# Patient Record
Sex: Female | Born: 1985
Health system: Southern US, Community
[De-identification: ages and names within clinical notes are randomized; demographics above are authoritative.]

## PROBLEM LIST (undated history)

## (undated) ENCOUNTER — Inpatient Hospital Stay (HOSPITAL_COMMUNITY): Payer: Self-pay

## (undated) DIAGNOSIS — Q513 Bicornate uterus: Secondary | ICD-10-CM

## (undated) DIAGNOSIS — B977 Papillomavirus as the cause of diseases classified elsewhere: Secondary | ICD-10-CM

## (undated) DIAGNOSIS — Q5128 Other doubling of uterus, other specified: Secondary | ICD-10-CM

## (undated) DIAGNOSIS — R87629 Unspecified abnormal cytological findings in specimens from vagina: Secondary | ICD-10-CM

## (undated) DIAGNOSIS — O34599 Maternal care for other abnormalities of gravid uterus, unspecified trimester: Secondary | ICD-10-CM

## (undated) DIAGNOSIS — R112 Nausea with vomiting, unspecified: Secondary | ICD-10-CM

## (undated) DIAGNOSIS — Z3480 Encounter for supervision of other normal pregnancy, unspecified trimester: Secondary | ICD-10-CM

## (undated) DIAGNOSIS — Z9889 Other specified postprocedural states: Secondary | ICD-10-CM

## (undated) HISTORY — PX: APPENDECTOMY: SHX54

## (undated) HISTORY — DX: Papillomavirus as the cause of diseases classified elsewhere: B97.7

## (undated) HISTORY — DX: Other doubling of uterus, other specified: Q51.28

## (undated) HISTORY — DX: Unspecified abnormal cytological findings in specimens from vagina: R87.629

## (undated) HISTORY — DX: Maternal care for other abnormalities of gravid uterus, unspecified trimester: O34.599

## (undated) HISTORY — PX: MULTIPLE TOOTH EXTRACTIONS: SHX2053

## (undated) HISTORY — PX: BREAST ENHANCEMENT SURGERY: SHX7

## (undated) HISTORY — PX: BREAST SURGERY: SHX581

---
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
## 2017-06-03 NOTE — L&D Delivery Note (Addendum)
Delivery Note At [phone] a viable female infant was delivered via SVD, presentation: LOA. Care assumed by Clelia CroftShaw, CNM after delivery of baby. APGAR: 9, 9; weight pending: pending.   Placenta status: spontaneously delivered, intact via . Cord: 3 vessel.  Complications nuchal x1, loose, reduced.  Anesthesia: epidural and 1% lidocaine Lacerations: R labia minora 'split', repaired with one stich Suture used for repair: 4.0 Vicryl Est. Blood Loss (mL): 200  Mom to postpartum.  Baby to Couplet care / Skin to Skin.   Donette LarryBhambri, Melanie, CNM 01/13/2018 8:52 AM   Delivery of placenta and repair by me, as documented above. Cam HaiSHAW, Hillery Bhalla CNM 01/13/2018 9:47 AM   Please schedule this patient for Postpartum visit in: 4 weeks with the following provider: Any provider For C/S patients schedule nurse incision check in weeks 2 weeks: no Low risk pregnancy complicated by: none Delivery mode:  SVD Anticipated Birth Control:  OCPs PP Procedures needed: none  Schedule Integrated BH visit: no

## 2017-07-09 LAB — OB RESULTS CONSOLE ABO/RH: RH TYPE: POSITIVE

## 2017-07-09 LAB — OB RESULTS CONSOLE GC/CHLAMYDIA
Chlamydia: NEGATIVE
Gonorrhea: NEGATIVE

## 2017-07-09 LAB — OB RESULTS CONSOLE RPR: RPR: NONREACTIVE

## 2017-07-09 LAB — OB RESULTS CONSOLE ANTIBODY SCREEN: ANTIBODY SCREEN: NEGATIVE

## 2017-07-09 LAB — OB RESULTS CONSOLE HEPATITIS B SURFACE ANTIGEN: Hepatitis B Surface Ag: NEGATIVE

## 2017-07-09 LAB — OB RESULTS CONSOLE RUBELLA ANTIBODY, IGM: Rubella: IMMUNE

## 2017-09-14 ENCOUNTER — Other Ambulatory Visit: Payer: Self-pay

## 2017-09-14 ENCOUNTER — Inpatient Hospital Stay (HOSPITAL_COMMUNITY)
Admission: AD | Admit: 2017-09-14 | Discharge: 2017-09-14 | Disposition: A | Discharge status: Home / Self Care | Payer: BLUE CROSS/BLUE SHIELD | Source: Ambulatory Visit | Attending: Obstetrics & Gynecology | Admitting: Obstetrics & Gynecology

## 2017-09-14 ENCOUNTER — Encounter (HOSPITAL_COMMUNITY): Payer: Self-pay | Admitting: *Deleted

## 2017-09-14 DIAGNOSIS — Z9889 Other specified postprocedural states: Secondary | ICD-10-CM | POA: Insufficient documentation

## 2017-09-14 DIAGNOSIS — O3402 Maternal care for unspecified congenital malformation of uterus, second trimester: Secondary | ICD-10-CM | POA: Diagnosis not present

## 2017-09-14 DIAGNOSIS — Z3A21 21 weeks gestation of pregnancy: Secondary | ICD-10-CM | POA: Diagnosis not present

## 2017-09-14 DIAGNOSIS — R109 Unspecified abdominal pain: Secondary | ICD-10-CM | POA: Diagnosis not present

## 2017-09-14 DIAGNOSIS — Q513 Bicornate uterus: Secondary | ICD-10-CM

## 2017-09-14 DIAGNOSIS — O9989 Other specified diseases and conditions complicating pregnancy, childbirth and the puerperium: Secondary | ICD-10-CM | POA: Diagnosis not present

## 2017-09-14 DIAGNOSIS — O9A212 Injury, poisoning and certain other consequences of external causes complicating pregnancy, second trimester: Secondary | ICD-10-CM | POA: Diagnosis not present

## 2017-09-14 HISTORY — DX: Bicornate uterus: Q51.3

## 2017-09-14 LAB — URINALYSIS, ROUTINE W REFLEX MICROSCOPIC
Bilirubin Urine: NEGATIVE
Glucose, UA: NEGATIVE mg/dL
Hgb urine dipstick: NEGATIVE
Ketones, ur: 80 mg/dL — AB
LEUKOCYTES UA: NEGATIVE
Nitrite: NEGATIVE
PH: 6 (ref 5.0–8.0)
Protein, ur: NEGATIVE mg/dL
SPECIFIC GRAVITY, URINE: 1.011 (ref 1.005–1.030)

## 2017-09-14 MED ORDER — ACETAMINOPHEN 500 MG PO TABS
1000.0000 mg | ORAL_TABLET | Freq: Once | ORAL | Status: AC
  Administered 2017-09-14: 1000 mg via ORAL
  Filled 2017-09-14: qty 2
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
## 2017-09-14 NOTE — Discharge Instructions (Signed)
[facility]  ° ° °[facility]       Phone: [phone] ° °[facility] Phone: [phone] ° °[facility] [place]  Phone: [phone] ° °[facility] [place]  Phone: [phone] ° °[facility] [place]  Phone: [phone] ° °[facility]       Phone: [phone] ° °[facility]    Phone: [phone]  ° °[facility]    Phone: [phone] ° °[facility]    Phone: [phone] ° °[facility]    Phone: [phone] ° °[facility]    Phone: [phone] ° °[facility]       Phone: [phone]  ° °[facility]  Phone: [phone] ° °[facility]    Phone: [phone] ° °[facility]   Phone: [phone] ° °[facility]      Phone: [phone] ° °[facility]    Phone: [phone] ° °Safe Medications in Pregnancy  ° °Acne: °Benzoyl Peroxide °Salicylic Acid ° °Backache/Headache: °Tylenol: 2 regular strength every 4 hours OR °             2 Extra strength every 6 hours ° °Colds/Coughs/Allergies: °Benadryl (alcohol free) 25 mg every 6 hours as needed °Breath right strips °Claritin °Cepacol throat lozenges °Chloraseptic throat spray °Cold-Eeze- up to three times per day °Cough drops, alcohol free °Flonase (by prescription only) °Guaifenesin °Mucinex °Robitussin DM (plain only, alcohol free) °Saline nasal spray/drops °Sudafed (pseudoephedrine) & Actifed ** use only after [redacted] weeks gestation and if you do not have high blood pressure °Tylenol °Vicks Vaporub °Zinc lozenges °Zyrtec  ° °Constipation: °Colace °Ducolax suppositories °Fleet enema °Glycerin suppositories °Metamucil °Milk of  magnesia °Miralax °Senokot °Smooth move tea ° °Diarrhea: °Kaopectate °Imodium A-D ° °*NO pepto Bismol ° °Hemorrhoids: °Anusol °Anusol HC °Preparation H °Tucks ° °Indigestion: °Tums °Maalox °Mylanta °Zantac  °Pepcid ° °Insomnia: °Benadryl (alcohol free) 25mg every 6 hours as needed °Tylenol PM °Unisom, no Gelcaps ° °Leg Cramps: °Tums °MagGel ° °Nausea/Vomiting:  °Bonine °Dramamine °Emetrol °Ginger extract °Sea bands °Meclizine  °Nausea medication to take during pregnancy:  °Unisom (doxylamine succinate 25 mg tablets) Take one tablet daily at bedtime. If symptoms are not adequately controlled, the dose can be increased to a maximum recommended dose of two tablets daily (1/2 tablet in the morning, 1/2 tablet mid-afternoon and one at bedtime). °Vitamin B6 100mg tablets. Take one tablet twice a day (up to 200 mg per day). ° °Skin Rashes: °Aveeno products °Benadryl cream or 25mg every 6 hours as needed °Calamine Lotion °1% cortisone cream ° °Yeast infection: °Gyne-lotrimin 7 °Monistat 7 ° ° °**If taking multiple medications, please check labels to avoid duplicating the same active ingredients °**take medication as directed on the label °** Do not exceed 4000 mg of tylenol in 24 hours °**Do not take medications that contain aspirin or ibuprofen ° ° ° ° °

## 2017-10-08 ENCOUNTER — Encounter: Payer: Self-pay | Admitting: *Deleted

## 2017-10-08 DIAGNOSIS — Z3480 Encounter for supervision of other normal pregnancy, unspecified trimester: Secondary | ICD-10-CM | POA: Insufficient documentation

## 2017-10-08 HISTORY — DX: Encounter for supervision of other normal pregnancy, unspecified trimester: Z34.80

## 2017-10-16 ENCOUNTER — Ambulatory Visit (INDEPENDENT_AMBULATORY_CARE_PROVIDER_SITE_OTHER): Payer: BLUE CROSS/BLUE SHIELD | Admitting: Obstetrics & Gynecology

## 2017-10-16 ENCOUNTER — Encounter: Payer: Self-pay | Admitting: Obstetrics & Gynecology

## 2017-10-16 DIAGNOSIS — Z3480 Encounter for supervision of other normal pregnancy, unspecified trimester: Secondary | ICD-10-CM

## 2017-10-16 DIAGNOSIS — Z3482 Encounter for supervision of other normal pregnancy, second trimester: Secondary | ICD-10-CM

## 2017-10-16 NOTE — Progress Notes (Signed)
   PRENATAL VISIT NOTE  Subjective:  Cassie Petersen is a 32 y.o. G2P0010 at [redacted]w[redacted]d being seen today for ongoing prenatal care.  She is currently monitored for the following issues for this low-risk pregnancy and has Supervision of other normal pregnancy, antepartum on their problem list.  Patient reports no complaints.  Contractions: Not present. Vag. Bleeding: None.  Movement: Present. Denies leaking of fluid.   The following portions of the patient's history were reviewed and updated as appropriate: allergies, current medications, past family history, past medical history, past social history, past surgical history and problem list. Problem list updated.  Objective:   Vitals:   10/08/17 1633 10/16/17 1414  BP:  107/68  Pulse:  (!) 112  Weight:  133 lb (60.3 kg)  Height:  (1.727 m)     Fetal Status: Fetal Heart Rate (bpm): 145   Movement: Present     General:  Alert, oriented and cooperative. Patient is in no acute distress.  Skin: Skin is warm and dry. No rash noted.   Cardiovascular: Normal heart rate noted  Respiratory: Normal respiratory effort, no problems with respiration noted  Abdomen: Soft, gravid, appropriate for gestational age.  Pain/Pressure: Present     Pelvic: Cervical exam deferred        Extremities: Normal range of motion.  Edema: None  Mental Status: Normal mood and affect. Normal behavior. Normal judgment and thought content.   Assessment and Plan:  Pregnancy: G2P0010 at [redacted]w[redacted]d  1. Supervision of other normal pregnancy, antepartum - 2 hour GTT at next visit We had a long discussion about her concerns about her "heart shaped uterus" and whether it can generate enough strength for a contraction. She was also concerned that the baby would be large and she would then talk about scheduling a primary c/s.   Preterm labor symptoms and general obstetric precautions including but not limited to vaginal bleeding, contractions, leaking of fluid and fetal movement  were reviewed in detail with the patient. Please refer to After Visit Summary for other counseling recommendations.  Return in about 2 weeks (around 10/30/2017) for 2 hour GTT.  Future Appointments  Date Time Provider Department Center  10/29/2017  9:30 AM Lesly Dukes, MD [facility]    Allie Bossier, MD

## 2017-10-29 ENCOUNTER — Ambulatory Visit (INDEPENDENT_AMBULATORY_CARE_PROVIDER_SITE_OTHER): Payer: BLUE CROSS/BLUE SHIELD | Admitting: Obstetrics & Gynecology

## 2017-10-29 VITALS — BP 108/63 | HR 102 | Wt 134.0 lb

## 2017-10-29 DIAGNOSIS — Z3400 Encounter for supervision of normal first pregnancy, unspecified trimester: Secondary | ICD-10-CM | POA: Diagnosis not present

## 2017-10-29 DIAGNOSIS — Z23 Encounter for immunization: Secondary | ICD-10-CM

## 2017-10-29 DIAGNOSIS — Z3483 Encounter for supervision of other normal pregnancy, third trimester: Secondary | ICD-10-CM

## 2017-10-29 DIAGNOSIS — Z3480 Encounter for supervision of other normal pregnancy, unspecified trimester: Secondary | ICD-10-CM

## 2017-10-29 NOTE — Progress Notes (Signed)
   PRENATAL VISIT NOTE  Subjective:  Cassie Petersen is a 32 y.o. G2P0010 at [redacted]w[redacted]d being seen today for ongoing prenatal care.  She is currently monitored for the following issues for this high-risk pregnancy and has Supervision of other normal pregnancy, antepartum on their problem list.  Patient reports no complaints.  Contractions: Not present. Vag. Bleeding: None.  Movement: Present. Denies leaking of fluid.   The following portions of the patient's history were reviewed and updated as appropriate: allergies, current medications, past family history, past medical history, past social history, past surgical history and problem list. Problem list updated.  Objective:   Vitals:   10/29/17 0915  BP: 108/63  Pulse: (!) 102  Weight: 134 lb (60.8 kg)    Fetal Status: Fetal Heart Rate (bpm): 140 Fundal Height: 27 cm Movement: Present     General:  Alert, oriented and cooperative. Patient is in no acute distress.  Skin: Skin is warm and dry. No rash noted.   Cardiovascular: Normal heart rate noted  Respiratory: Normal respiratory effort, no problems with respiration noted  Abdomen: Soft, gravid, appropriate for gestational age.  Pain/Pressure: Present     Pelvic: Cervical exam deferred        Extremities: Normal range of motion.  Edema: None  Mental Status: Normal mood and affect. Normal behavior. Normal judgment and thought content.   Assessment and Plan:  Pregnancy: G2P0010 at [redacted]w[redacted]d  1. Supervision of normal first pregnancy, antepartum - 2Hr GTT w/ 1 Hr Carpenter 75 g - HIV antibody (with reflex) - CBC - RPR - Tdap vaccine greater than or equal to 7yo IM  2. Bicornuate uterus - US MFM OB COMP + 14 WK; Future - Enroll Patient in Babyscripts  Preterm labor symptoms and general obstetric precautions including but not limited to vaginal bleeding, contractions, leaking of fluid and fetal movement were reviewed in detail with the patient. Please refer to After Visit Summary for  other counseling recommendations.  Return in about 2 weeks (around 11/12/2017).  No future appointments.  [name], MD

## 2017-10-30 ENCOUNTER — Telehealth: Payer: Self-pay | Admitting: *Deleted

## 2017-10-30 LAB — CBC
HCT: 35.9 % (ref 35.0–45.0)
HEMOGLOBIN: 12.3 g/dL (ref 11.7–15.5)
MCH: 31.4 pg (ref 27.0–33.0)
MCHC: 34.3 g/dL (ref 32.0–36.0)
MCV: 91.6 fL (ref 80.0–100.0)
MPV: 10.7 fL (ref 7.5–12.5)
Platelets: 171 10*3/uL (ref 140–400)
RBC: 3.92 10*6/uL (ref 3.80–5.10)
RDW: 12.8 % (ref 11.0–15.0)
WBC: 10.6 10*3/uL (ref 3.8–10.8)

## 2017-10-30 LAB — 2HR GTT W 1 HR, CARPENTER, 75 G
Glucose, 1 Hr, Gest: 134 mg/dL (ref 65–179)
Glucose, 2 Hr, Gest: 113 mg/dL (ref 65–152)
Glucose, Fasting, Gest: 75 mg/dL (ref 65–91)

## 2017-10-30 LAB — HIV ANTIBODY (ROUTINE TESTING W REFLEX): HIV: NONREACTIVE

## 2017-10-30 LAB — RPR: RPR Ser Ql: NONREACTIVE

## 2017-10-30 NOTE — Telephone Encounter (Signed)
Pt aware of normal 2 hr GTT.

## 2017-11-11 ENCOUNTER — Encounter (HOSPITAL_COMMUNITY): Payer: Self-pay

## 2017-11-13 ENCOUNTER — Ambulatory Visit (INDEPENDENT_AMBULATORY_CARE_PROVIDER_SITE_OTHER): Payer: BLUE CROSS/BLUE SHIELD | Admitting: Obstetrics & Gynecology

## 2017-11-13 VITALS — BP 102/73 | HR 93 | Wt 134.0 lb

## 2017-11-13 DIAGNOSIS — Z3483 Encounter for supervision of other normal pregnancy, third trimester: Secondary | ICD-10-CM

## 2017-11-13 DIAGNOSIS — Z3480 Encounter for supervision of other normal pregnancy, unspecified trimester: Secondary | ICD-10-CM

## 2017-11-13 NOTE — Progress Notes (Signed)
   PRENATAL VISIT NOTE  Subjective:  Cassie Petersen is a 32 y.o. G2P0010 at 2239w3d being seen today for ongoing prenatal care.  She is currently monitored for the following issues for this high-risk pregnancy and has Supervision of other normal pregnancy, antepartum on their problem list.  Patient reports no complaints.  Contractions: Irritability. Vag. Bleeding: None.  Movement: Absent. Denies leaking of fluid.   The following portions of the patient's history were reviewed and updated as appropriate: allergies, current medications, past family history, past medical history, past social history, past surgical history and problem list. Problem list updated.  Objective:   Vitals:   11/13/17 1446  BP: 102/73  Pulse: 93  Weight: 134 lb (60.8 kg)    Fetal Status: Fetal Heart Rate (bpm): 137   Movement: Absent     General:  Alert, oriented and cooperative. Patient is in no acute distress.  Skin: Skin is warm and dry. No rash noted.   Cardiovascular: Normal heart rate noted  Respiratory: Normal respiratory effort, no problems with respiration noted  Abdomen: Soft, gravid, appropriate for gestational age.  Pain/Pressure: Present     Pelvic: Cervical exam deferred        Extremities: Normal range of motion.  Edema: None  Mental Status: Normal mood and affect. Normal behavior. Normal judgment and thought content.   Assessment and Plan:  Pregnancy: G2P0010 at 7439w3d  1. Supervision of other normal pregnancy, antepartum - MFM u/s 11-19-17  Preterm labor symptoms and general obstetric precautions including but not limited to vaginal bleeding, contractions, leaking of fluid and fetal movement were reviewed in detail with the patient. Please refer to After Visit Summary for other counseling recommendations.  No follow-ups on file.  Future Appointments  Date Time Provider Department Center  11/19/2017  1:15 PM [facility] US 4 [facility]  12/02/2017  1:15 PM Allie Bossierove, Emrys Mceachron C, MD [facility]  [facility]    Allie BossierMyra C Dewain Platz, MD

## 2017-11-17 ENCOUNTER — Ambulatory Visit (HOSPITAL_COMMUNITY): Payer: BLUE CROSS/BLUE SHIELD

## 2017-11-19 ENCOUNTER — Ambulatory Visit (HOSPITAL_COMMUNITY)
Admission: RE | Admit: 2017-11-19 | Discharge: 2017-11-19 | Disposition: A | Discharge status: Home / Self Care | Payer: BLUE CROSS/BLUE SHIELD | Source: Ambulatory Visit | Attending: Obstetrics & Gynecology | Admitting: Obstetrics & Gynecology

## 2017-11-19 DIAGNOSIS — Z3A31 31 weeks gestation of pregnancy: Secondary | ICD-10-CM | POA: Diagnosis not present

## 2017-11-19 DIAGNOSIS — Z3480 Encounter for supervision of other normal pregnancy, unspecified trimester: Secondary | ICD-10-CM

## 2017-11-19 DIAGNOSIS — Z3483 Encounter for supervision of other normal pregnancy, third trimester: Secondary | ICD-10-CM | POA: Diagnosis not present

## 2017-11-19 DIAGNOSIS — Z363 Encounter for antenatal screening for malformations: Secondary | ICD-10-CM | POA: Diagnosis not present

## 2017-11-19 IMAGING — US US MFM OB COMP +14 WKS
1 series · 14 of 28 positions shown · non-contrast
Comparison: none

[Series 1: us mfm ob comp +14 wks · 79 acquisitions, 14 frames shown]
[im 3/79]
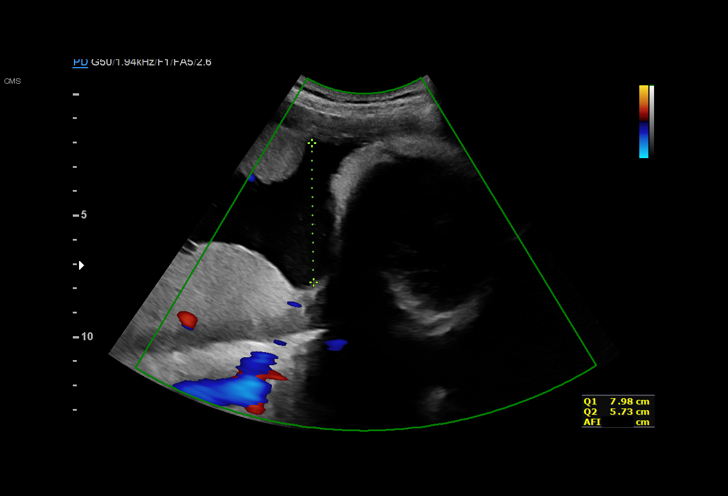
[im 9/79]
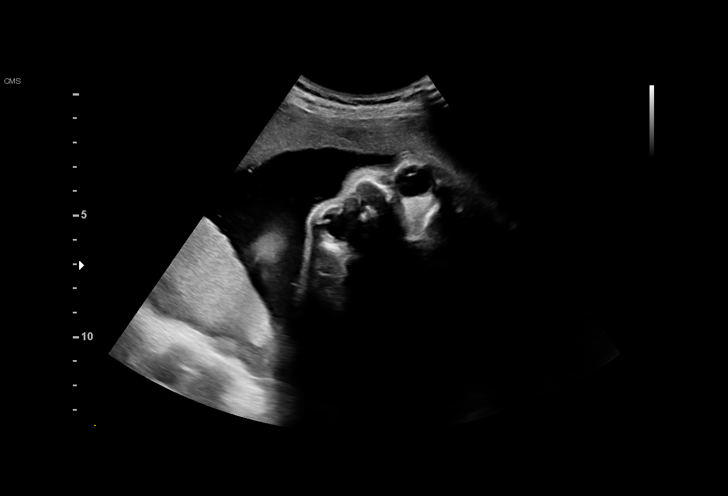
[im 15/79]
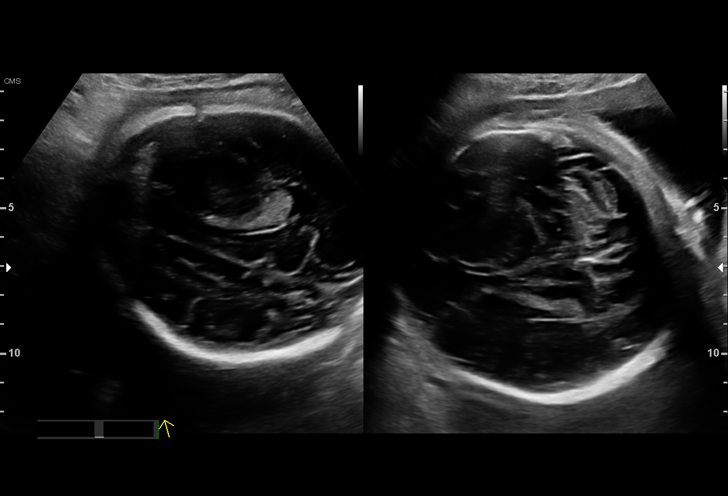
[im 21/79]
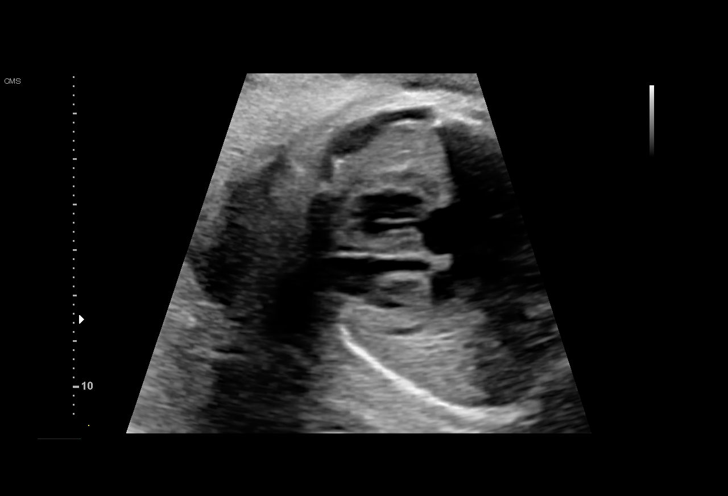
[im 27/79]
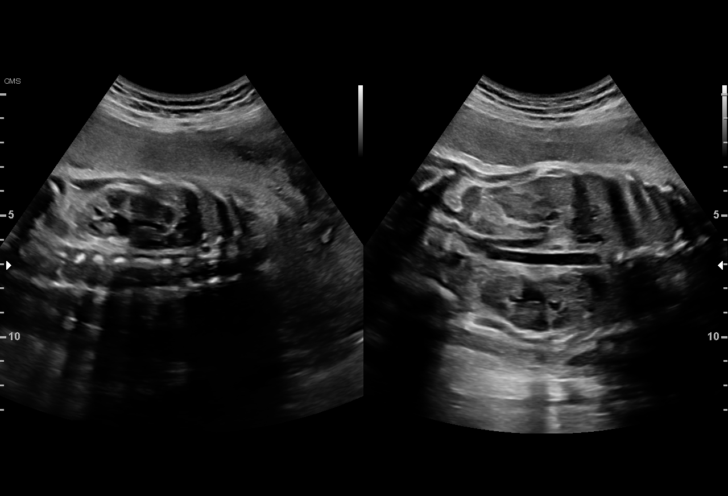
[im 32/79]
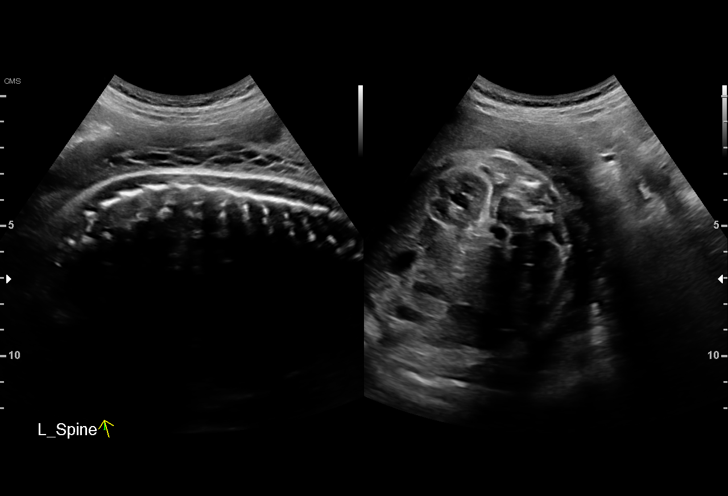
[im 38/79]
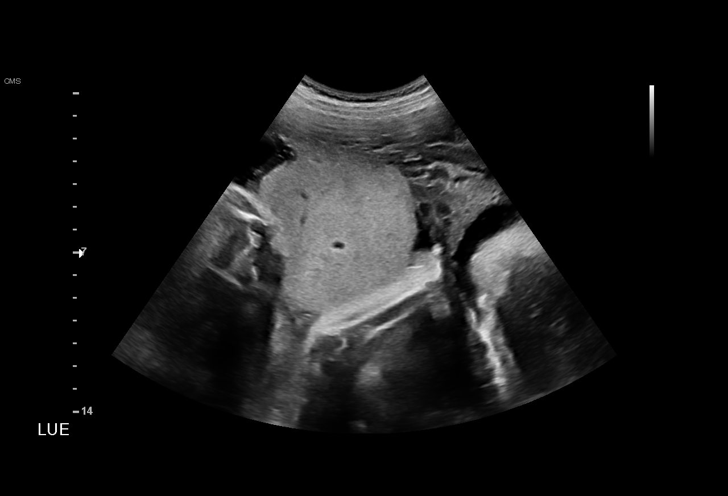
[im 44/79]
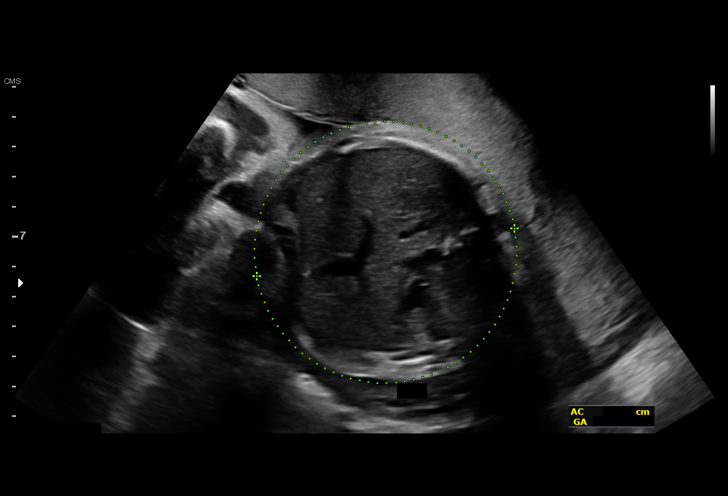
[im 50/79]
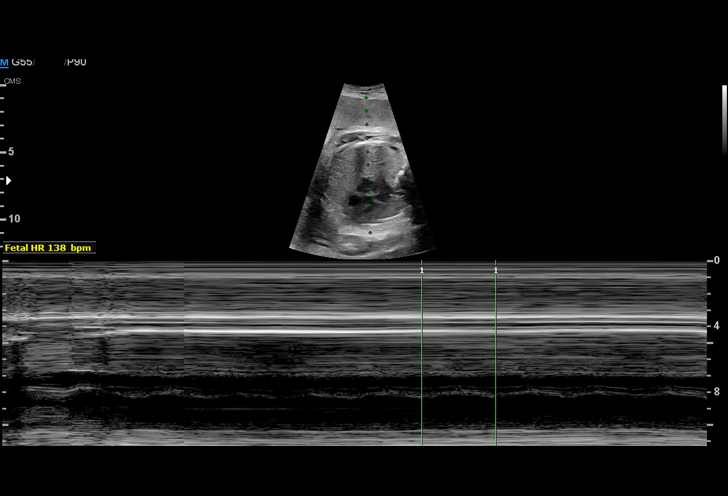
[im 55/79]
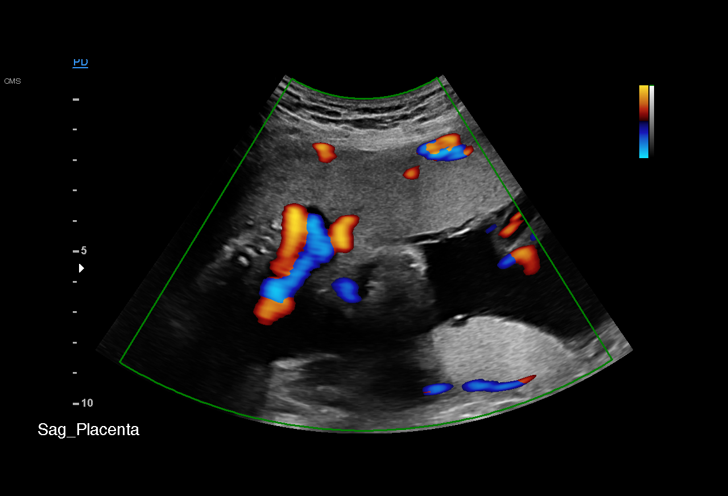
[im 61/79]
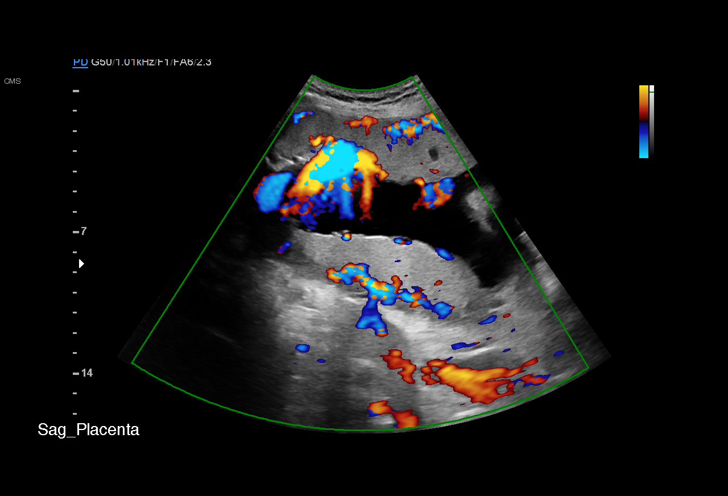
[im 67/79]
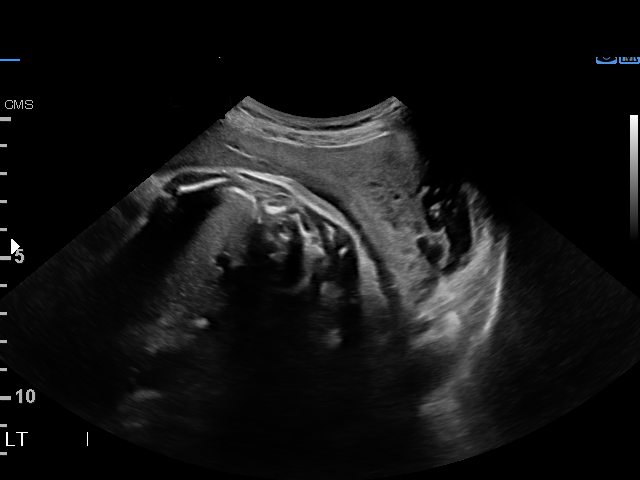
[im 73/79]
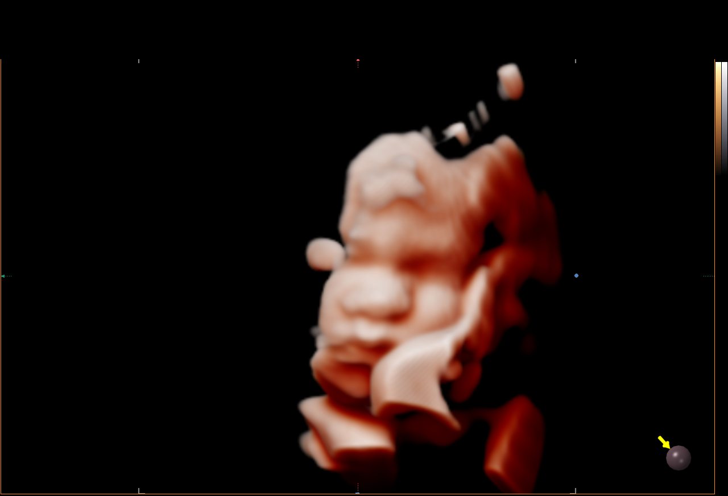
[im 79/79]
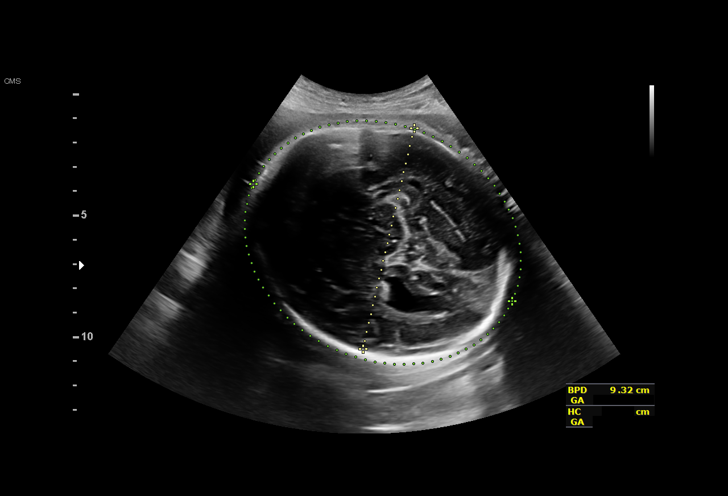

[14 of 28 positions shown; findings below may reference images not displayed]

OB/Gyn Clinic

1  BEENISH            [PHONE_NUMBER]      [PHONE_NUMBER]     [PHONE_NUMBER]
Indications

31 weeks gestation of pregnancy
Encounter for antenatal screening for          [WI]
malformations
Uterine abnormality during pregnancy           [WI]
(bicornuate?)
Fetal Evaluation

Num Of Fetuses:     1
Fetal Heart         138
Rate(bpm):
Cardiac Activity:   Observed
Presentation:       Cephalic
Placenta:           Anterior w/ post succenturiate, above os
P. Cord Insertion:  Visualized

Amniotic Fluid
AFI FV:      Subjectively within normal limits

AFI Sum(cm)     %Tile       Largest Pocket(cm)
19.52           74

RUQ(cm)       RLQ(cm)       LUQ(cm)        LLQ(cm)
7.98
Biometry

BPD:        93  mm     G. Age:  37w 6d       > 99  %    CI:         78.5   %    70 - 86
FL/HC:      17.6   %    19.3 -
HC:       332   mm     G. Age:  37w 6d       > 97  %    HC/AC:      1.21        0.96 -
AC:      275.4  mm     G. Age:  31w 4d         57  %    FL/BPD:     62.8   %    71 - 87
FL:       58.4  mm     G. Age:  30w 4d         18  %    FL/AC:      21.2   %    20 - 24
CER:      44.1  mm     G. Age:  38w 1d       > 95  %
CM:        7.1  mm

Est. FW:    [WI]  gm      4 lb 5 oz     76  %
Gestational Age

LMP:           31w 2d        Date:  [DATE]                 EDD:   [DATE]
U/S Today:     34w 3d                                        EDD:   [DATE]
Best:          31w 2d     Det. By:  LMP  ([DATE])          EDD:   [DATE]
Anatomy

Cranium:               Appears normal         Aortic Arch:            Appears normal
Cavum:                 Appears normal         Ductal Arch:            Appears normal
Ventricles:            Appears normal         Diaphragm:              Not well visualized
Choroid Plexus:        Appears normal         Stomach:                Appears normal, left
sided
Cerebellum:            Appears normal         Abdomen:                Appears normal
Posterior Fossa:       Appears normal         Abdominal Wall:         Not well visualized
Nuchal Fold:           Not applicable (>20    Cord Vessels:           Appears normal (3
wks GA)                                        vessel cord)
Face:                  Appears normal         Kidneys:                Appear normal
(orbits and profile)
Lips:                  Appears normal         Bladder:                Appears normal
Thoracic:              Appears normal         Spine:                  Appears normal
Heart:                 Appears normal         Upper Extremities:      Visualized
(4CH, axis, and
situs)
RVOT:                  Appears normal         Lower Extremities:      Visualized
LVOT:                  Appears normal

Other:  Male gender. Nasal bone visualized.
Cervix Uterus Adnexa

Cervix
Not visualized (advanced GA >[WI])

Uterus
Bicornuate.

Left Ovary
Within normal limits.

Right Ovary
Within normal limits.

Cul De Sac:   No free fluid seen.

Adnexa:       No abnormality visualized.
Impression

Single living intrauterine pregnancy at 31w 2d.
Placenta Anterior with posterior succenturiate lobe, above os.

Known bicornuate uterus, pregnancy in left horn.
Appropriate composite fetal growth.
The HC measures > 97%ile.
Normal amniotic fluid volume.
The fetal anatomic survey is not complete.
No gross fetal anomalies identified.
The adnexa appear normal bilaterally without masses.
Recommendations

Continue serial ultrasounds for fetal growth given significantly
enlarged HC and bicornuate uterus.

## 2017-12-02 ENCOUNTER — Ambulatory Visit (INDEPENDENT_AMBULATORY_CARE_PROVIDER_SITE_OTHER): Payer: BLUE CROSS/BLUE SHIELD | Admitting: Obstetrics & Gynecology

## 2017-12-02 VITALS — BP 119/81 | HR 102 | Wt 140.0 lb

## 2017-12-02 DIAGNOSIS — Z3480 Encounter for supervision of other normal pregnancy, unspecified trimester: Secondary | ICD-10-CM

## 2017-12-02 NOTE — Progress Notes (Signed)
   PRENATAL VISIT NOTE  Subjective:  Cassie JewelsLindsey Petersen is a 32 y.o. G2P0010 at 9318w1d being seen today for ongoing prenatal care.  She is currently monitored for the following issues for this low-risk pregnancy and has Supervision of other normal pregnancy, antepartum on their problem list.  Patient reports no complaints.  Contractions: Not present. Vag. Bleeding: None.  Movement: Present. Denies leaking of fluid.   The following portions of the patient's history were reviewed and updated as appropriate: allergies, current medications, past family history, past medical history, past social history, past surgical history and problem list. Problem list updated.  Objective:   Vitals:   12/02/17 1319  BP: 119/81  Pulse: (!) 102  Weight: 140 lb (63.5 kg)    Fetal Status: Fetal Heart Rate (bpm): 133 Fundal Height: 33 cm Movement: Present     General:  Alert, oriented and cooperative. Patient is in no acute distress.  Skin: Skin is warm and dry. No rash noted.   Cardiovascular: Normal heart rate noted  Respiratory: Normal respiratory effort, no problems with respiration noted  Abdomen: Soft, gravid, appropriate for gestational age.  Pain/Pressure: Absent     Pelvic: Cervical exam deferred        Extremities: Normal range of motion.  Edema: None  Mental Status: Normal mood and affect. Normal behavior. Normal judgment and thought content.   Assessment and Plan:  Pregnancy: G2P0010 at 5318w1d  1. Supervision of other normal pregnancy, antepartum  - US MFM OB FOLLOW UP; Future  Preterm labor symptoms and general obstetric precautions including but not limited to vaginal bleeding, contractions, leaking of fluid and fetal movement were reviewed in detail with the patient. Please refer to After Visit Summary for other counseling recommendations.  Return in about 2 weeks (around 12/16/2017).  No future appointments.  Allie BossierMyra C Yousuf Ager, MD

## 2017-12-09 ENCOUNTER — Ambulatory Visit (HOSPITAL_COMMUNITY): Payer: BLUE CROSS/BLUE SHIELD

## 2017-12-09 ENCOUNTER — Other Ambulatory Visit: Payer: Self-pay | Admitting: Obstetrics & Gynecology

## 2017-12-09 ENCOUNTER — Other Ambulatory Visit (HOSPITAL_COMMUNITY): Payer: Self-pay | Admitting: *Deleted

## 2017-12-09 ENCOUNTER — Ambulatory Visit (HOSPITAL_COMMUNITY)
Admission: RE | Admit: 2017-12-09 | Discharge: 2017-12-09 | Disposition: A | Discharge status: Home / Self Care | Payer: BLUE CROSS/BLUE SHIELD | Source: Ambulatory Visit | Attending: Obstetrics & Gynecology | Admitting: Obstetrics & Gynecology

## 2017-12-09 DIAGNOSIS — O34593 Maternal care for other abnormalities of gravid uterus, third trimester: Secondary | ICD-10-CM

## 2017-12-09 DIAGNOSIS — Z362 Encounter for other antenatal screening follow-up: Secondary | ICD-10-CM

## 2017-12-09 DIAGNOSIS — Z0489 Encounter for examination and observation for other specified reasons: Secondary | ICD-10-CM

## 2017-12-09 DIAGNOSIS — Q513 Bicornate uterus: Secondary | ICD-10-CM

## 2017-12-09 DIAGNOSIS — Z3A34 34 weeks gestation of pregnancy: Secondary | ICD-10-CM

## 2017-12-09 DIAGNOSIS — Z3480 Encounter for supervision of other normal pregnancy, unspecified trimester: Secondary | ICD-10-CM | POA: Diagnosis not present

## 2017-12-09 DIAGNOSIS — IMO0002 Reserved for concepts with insufficient information to code with codable children: Secondary | ICD-10-CM

## 2017-12-10 ENCOUNTER — Telehealth: Payer: Self-pay | Admitting: Obstetrics & Gynecology

## 2017-12-10 NOTE — Telephone Encounter (Signed)
Spoke with pt about her traveling via plane this weekend.  She will be 33 weeks and is low risk.  There is no reason that she cannot fly but instructed to call the airline and see what their cutoff is.

## 2017-12-10 NOTE — Telephone Encounter (Signed)
Pt called stating she needs "medial clearance" to travel on a plane this Friday 12/12/17. Pt states her husband wanted her to call to make sure it is OK to travel. Please advise.

## 2017-12-17 ENCOUNTER — Ambulatory Visit (INDEPENDENT_AMBULATORY_CARE_PROVIDER_SITE_OTHER): Payer: BLUE CROSS/BLUE SHIELD | Admitting: Obstetrics & Gynecology

## 2017-12-17 VITALS — BP 98/63 | HR 80 | Wt 139.0 lb

## 2017-12-17 DIAGNOSIS — Z3483 Encounter for supervision of other normal pregnancy, third trimester: Secondary | ICD-10-CM

## 2017-12-17 DIAGNOSIS — Z3480 Encounter for supervision of other normal pregnancy, unspecified trimester: Secondary | ICD-10-CM

## 2017-12-17 NOTE — Progress Notes (Signed)
   PRENATAL VISIT NOTE  Subjective:  Cassie Petersen is a [age] y.o. G2P0010 at 5367w2d being seen today for ongoing prenatal care.  She is currently monitored for the following issues for this low-risk pregnancy and has Supervision of other normal pregnancy, antepartum on their problem list.  Patient reports no complaints.  Contractions: Not present. Vag. Bleeding: None.  Movement: Present. Denies leaking of fluid.   The following portions of the patient's history were reviewed and updated as appropriate: allergies, current medications, past family history, past medical history, past social history, past surgical history and problem list. Problem list updated.  Objective:   Vitals:   12/17/17 1127  BP: 98/63  Pulse: 80  Weight: 139 lb (63 kg)    Fetal Status: Fetal Heart Rate (bpm): 131   Movement: Present     General:  Alert, oriented and cooperative. Patient is in no acute distress.  Skin: Skin is warm and dry. No rash noted.   Cardiovascular: Normal heart rate noted  Respiratory: Normal respiratory effort, no problems with respiration noted  Abdomen: Soft, gravid, appropriate for gestational age.  Pain/Pressure: Absent     Pelvic: Cervical exam deferred        Extremities: Normal range of motion.  Edema: None  Mental Status: Normal mood and affect. Normal behavior. Normal judgment and thought content.   Assessment and Plan:  Pregnancy: G2P0010 at 5167w2d  1. Supervision of other normal pregnancy, antepartum   Preterm labor symptoms and general obstetric precautions including but not limited to vaginal bleeding, contractions, leaking of fluid and fetal movement were reviewed in detail with the patient. Please refer to After Visit Summary for other counseling recommendations.  No follow-ups on file.  Future Appointments  Date Time Provider Department Center  01/06/2018  4:00 PM [facility] US 3 [facility]    Allie BossierMyra C Tasmin Exantus, MD

## 2017-12-25 ENCOUNTER — Ambulatory Visit (INDEPENDENT_AMBULATORY_CARE_PROVIDER_SITE_OTHER): Payer: BLUE CROSS/BLUE SHIELD | Admitting: Obstetrics & Gynecology

## 2017-12-25 VITALS — BP 130/81 | HR 94 | Wt 138.0 lb

## 2017-12-25 DIAGNOSIS — Z3480 Encounter for supervision of other normal pregnancy, unspecified trimester: Secondary | ICD-10-CM

## 2017-12-25 DIAGNOSIS — Z3400 Encounter for supervision of normal first pregnancy, unspecified trimester: Secondary | ICD-10-CM

## 2017-12-25 NOTE — Progress Notes (Signed)
   PRENATAL VISIT NOTE  Subjective:  Cassie Petersen is a 8Kalman Jewels89 y.o. G2P0010 at 148w3d being seen today for ongoing prenatal care.  She is currently monitored for the following issues for this low-risk pregnancy and has Supervision of other normal pregnancy, antepartum on their problem list.  Patient reports no complaints. She is very sad as she lost 2 dogs this week. She declines to have cervical cultures today.  Contractions: Not present. Vag. Bleeding: None.  Movement: Present. Denies leaking of fluid.   The following portions of the patient's history were reviewed and updated as appropriate: allergies, current medications, past family history, past medical history, past social history, past surgical history and problem list. Problem list updated.  Objective:   Vitals:   12/25/17 1327  BP: 130/81  Pulse: 94  Weight: 138 lb (62.6 kg)    Fetal Status: Fetal Heart Rate (bpm): 144   Movement: Present     General:  Alert, oriented and cooperative. Patient is in no acute distress.  Skin: Skin is warm and dry. No rash noted.   Cardiovascular: Normal heart rate noted  Respiratory: Normal respiratory effort, no problems with respiration noted  Abdomen: Soft, gravid, appropriate for gestational age.  Pain/Pressure: Absent     Pelvic: Cervical exam deferred        Extremities: Normal range of motion.  Edema: None  Mental Status: Normal mood and affect. Normal behavior. Normal judgment and thought content.   Assessment and Plan:  Pregnancy: G2P0010 at 5148w3d  1. Supervision of normal first pregnancy, antepartum - cervical cultures at next visit  2. Supervision of other normal pregnancy, antepartum   Preterm labor symptoms and general obstetric precautions including but not limited to vaginal bleeding, contractions, leaking of fluid and fetal movement were reviewed in detail with the patient. Please refer to After Visit Summary for other counseling recommendations.  Return in about 1 week  (around 01/01/2018).  Future Appointments  Date Time Provider Department Center  01/06/2018  4:00 PM [facility] US 3 [facility]    Allie BossierMyra C Anaise Sterbenz, MD

## 2017-12-31 ENCOUNTER — Ambulatory Visit (INDEPENDENT_AMBULATORY_CARE_PROVIDER_SITE_OTHER): Payer: BLUE CROSS/BLUE SHIELD | Admitting: Obstetrics & Gynecology

## 2017-12-31 VITALS — BP 127/91 | HR 108 | Wt 140.0 lb

## 2017-12-31 DIAGNOSIS — Z3403 Encounter for supervision of normal first pregnancy, third trimester: Secondary | ICD-10-CM

## 2017-12-31 DIAGNOSIS — O3660X0 Maternal care for excessive fetal growth, unspecified trimester, not applicable or unspecified: Secondary | ICD-10-CM

## 2017-12-31 DIAGNOSIS — Z3400 Encounter for supervision of normal first pregnancy, unspecified trimester: Secondary | ICD-10-CM | POA: Diagnosis not present

## 2017-12-31 DIAGNOSIS — Z113 Encounter for screening for infections with a predominantly sexual mode of transmission: Secondary | ICD-10-CM

## 2017-12-31 LAB — OB RESULTS CONSOLE GC/CHLAMYDIA: GC PROBE AMP, GENITAL: NEGATIVE

## 2017-12-31 LAB — OB RESULTS CONSOLE GBS: STREP GROUP B AG: POSITIVE

## 2018-01-02 LAB — CULTURE, BETA STREP (GROUP B ONLY)
MICRO NUMBER: 90905303
SPECIMEN QUALITY: ADEQUATE

## 2018-01-02 LAB — GC/CHLAMYDIA PROBE AMP (~~LOC~~) NOT AT ARMC
CHLAMYDIA, DNA PROBE: NEGATIVE
Neisseria Gonorrhea: NEGATIVE

## 2018-01-03 DIAGNOSIS — O3660X0 Maternal care for excessive fetal growth, unspecified trimester, not applicable or unspecified: Secondary | ICD-10-CM | POA: Insufficient documentation

## 2018-01-03 NOTE — Progress Notes (Signed)
   PRENATAL VISIT NOTE  Subjective:  Cassie Petersen is a 32 y.o. G2P0010 at 8544w2d being seen today for ongoing prenatal care.  She is currently monitored for the following issues for this low-risk pregnancy and has Supervision of other normal pregnancy, antepartum and Large for gestational age fetus affecting management of mother, antepartum on their problem list.  Patient reports no complaints.  Contractions: Not present. Vag. Bleeding: None.  Movement: Present. Denies leaking of fluid.   The following portions of the patient's history were reviewed and updated as appropriate: allergies, current medications, past family history, past medical history, past social history, past surgical history and problem list. Problem list updated.  Objective:   Vitals:   12/31/17 1115  BP: (!) 127/91  Pulse: (!) 108  Weight: 140 lb (63.5 kg)    Fetal Status: Fetal Heart Rate (bpm): 141   Movement: Present     General:  Alert, oriented and cooperative. Patient is in no acute distress.  Skin: Skin is warm and dry. No rash noted.   Cardiovascular: Normal heart rate noted  Respiratory: Normal respiratory effort, no problems with respiration noted  Abdomen: Soft, gravid, appropriate for gestational age.  Pain/Pressure: Absent     Pelvic: Cervical exam deferred        Extremities: Normal range of motion.  Edema: None  Mental Status: Normal mood and affect. Normal behavior. Normal judgment and thought content.   Assessment and Plan:  Pregnancy: G2P0010 at 6479w5d  1. Supervision of normal first pregnancy, antepartum - Culture, beta strep (group b only) - GC/Chlamydia probe amp ([place]not [facility]  2. Excessive fetal growth affecting management of pregnancy, antepartum, single or unspecified fetus Growth US next Tuesday.  Will make decision about mode of delivery on Wednesday.  Term labor symptoms and general obstetric precautions including but not limited to vaginal bleeding, contractions,  leaking of fluid and fetal movement were reviewed in detail with the patient. Please refer to After Visit Summary for other counseling recommendations.  No follow-ups on file.  Future Appointments  Date Time Provider Department Center  01/06/2018  4:00 PM [facility] US 3 [facility]US  01/07/2018 11:30 AM Lesly DukesLeggett, Greggory Safranek H, MD [facility]    Elsie LincolnKelly Tee Richeson, MD

## 2018-01-06 ENCOUNTER — Other Ambulatory Visit (HOSPITAL_COMMUNITY): Payer: Self-pay | Admitting: Obstetrics and Gynecology

## 2018-01-06 ENCOUNTER — Ambulatory Visit (HOSPITAL_COMMUNITY)
Admission: RE | Admit: 2018-01-06 | Discharge: 2018-01-06 | Disposition: A | Discharge status: Home / Self Care | Payer: BLUE CROSS/BLUE SHIELD | Source: Ambulatory Visit | Attending: Obstetrics & Gynecology | Admitting: Obstetrics & Gynecology

## 2018-01-06 DIAGNOSIS — O34593 Maternal care for other abnormalities of gravid uterus, third trimester: Secondary | ICD-10-CM

## 2018-01-06 DIAGNOSIS — Z3A38 38 weeks gestation of pregnancy: Secondary | ICD-10-CM

## 2018-01-06 DIAGNOSIS — Z362 Encounter for other antenatal screening follow-up: Secondary | ICD-10-CM

## 2018-01-06 DIAGNOSIS — O3660X0 Maternal care for excessive fetal growth, unspecified trimester, not applicable or unspecified: Secondary | ICD-10-CM | POA: Diagnosis not present

## 2018-01-06 DIAGNOSIS — O34599 Maternal care for other abnormalities of gravid uterus, unspecified trimester: Secondary | ICD-10-CM | POA: Diagnosis not present

## 2018-01-07 ENCOUNTER — Ambulatory Visit (INDEPENDENT_AMBULATORY_CARE_PROVIDER_SITE_OTHER): Payer: BLUE CROSS/BLUE SHIELD | Admitting: Obstetrics & Gynecology

## 2018-01-07 VITALS — BP 112/72 | HR 98 | Wt 141.0 lb

## 2018-01-07 DIAGNOSIS — Z3483 Encounter for supervision of other normal pregnancy, third trimester: Secondary | ICD-10-CM

## 2018-01-07 DIAGNOSIS — O3660X0 Maternal care for excessive fetal growth, unspecified trimester, not applicable or unspecified: Secondary | ICD-10-CM

## 2018-01-07 DIAGNOSIS — Z3480 Encounter for supervision of other normal pregnancy, unspecified trimester: Secondary | ICD-10-CM

## 2018-01-07 MED ORDER — HYDROXYZINE PAMOATE 25 MG PO CAPS: 25.0000 mg | ORAL_CAPSULE | Freq: Every evening | ORAL | 0 refills | Status: DC | PRN

## 2018-01-07 NOTE — Progress Notes (Signed)
   PRENATAL VISIT NOTE  Subjective:  Cassie Petersen is a 32 y.o. G2P0010 at 2364w2d being seen today for ongoing prenatal care.  She is currently monitored for the following issues for this low-risk pregnancy and has Supervision of other normal pregnancy, antepartum and Large for gestational age fetus affecting management of mother, antepartum on their problem list.  Patient reports patient c/o anxiety and depression.  two dogs recently died and very sad..  Contractions: Not present. Vag. Bleeding: None.  Movement: Present. Denies leaking of fluid.   The following portions of the patient's history were reviewed and updated as appropriate: allergies, current medications, past family history, past medical history, past social history, past surgical history and problem list. Problem list updated.  Objective:   Vitals:   01/07/18 1142  BP: 112/72  Pulse: 98  Weight: 141 lb (64 kg)    Fetal Status: Fetal Heart Rate (bpm): 145   Movement: Present     General:  Alert, oriented and cooperative. Patient is in no acute distress.  Skin: Skin is warm and dry. No rash noted.   Cardiovascular: Normal heart rate noted  Respiratory: Normal respiratory effort, no problems with respiration noted  Abdomen: Soft, gravid, appropriate for gestational age.  Pain/Pressure: Absent     Pelvic: Cervical exam deferred        Extremities: Normal range of motion.  Edema: None  Mental Status: Normal mood and affect. Normal behavior. Normal judgment and thought content.   Assessment and Plan:  Pregnancy: G2P0010 at 1264w2d  1. Excessive fetal growth affecting management of pregnancy, antepartum, single or unspecified fetus 8 pounds 1 ounce at 38 weeks 2 days  2.  Moderate anxiety and depression -Pt prefers not to have medication at this time.  She is very anxious about delivery.  Trouble sleeping.  Non HI/SI.  Pt aware that she can ask for medication at any time. CBT could be good option if anxiety continues.   Vistaril for sleep.     Term labor symptoms and general obstetric precautions including but not limited to vaginal bleeding, contractions, leaking of fluid and fetal movement were reviewed in detail with the patient. Please refer to After Visit Summary for other counseling recommendations.  Return in about 1 week (around 01/14/2018).  No future appointments.  Elsie LincolnKelly Audreanna Torrisi, MD

## 2018-01-12 ENCOUNTER — Encounter (HOSPITAL_COMMUNITY): Payer: Self-pay | Admitting: *Deleted

## 2018-01-12 ENCOUNTER — Inpatient Hospital Stay (HOSPITAL_COMMUNITY)
Admission: AD | Admit: 2018-01-12 | Discharge: 2018-01-15 | DRG: 807 | Disposition: A | Discharge status: Home / Self Care | Payer: BLUE CROSS/BLUE SHIELD | Attending: Family Medicine | Admitting: Family Medicine

## 2018-01-12 ENCOUNTER — Inpatient Hospital Stay (HOSPITAL_COMMUNITY): Payer: BLUE CROSS/BLUE SHIELD | Admitting: Anesthesiology

## 2018-01-12 ENCOUNTER — Other Ambulatory Visit: Payer: Self-pay

## 2018-01-12 DIAGNOSIS — O99824 Streptococcus B carrier state complicating childbirth: Secondary | ICD-10-CM | POA: Diagnosis not present

## 2018-01-12 DIAGNOSIS — O4292 Full-term premature rupture of membranes, unspecified as to length of time between rupture and onset of labor: Secondary | ICD-10-CM | POA: Diagnosis not present

## 2018-01-12 DIAGNOSIS — O3663X0 Maternal care for excessive fetal growth, third trimester, not applicable or unspecified: Secondary | ICD-10-CM | POA: Diagnosis not present

## 2018-01-12 DIAGNOSIS — Z3A39 39 weeks gestation of pregnancy: Secondary | ICD-10-CM

## 2018-01-12 LAB — CBC
HCT: 40.3 % (ref 36.0–46.0)
Hemoglobin: 14 g/dL (ref 12.0–15.0)
MCH: 32 pg (ref 26.0–34.0)
MCHC: 34.7 g/dL (ref 30.0–36.0)
MCV: 92 fL (ref 78.0–100.0)
PLATELETS: 185 10*3/uL (ref 150–400)
RBC: 4.38 MIL/uL (ref 3.87–5.11)
RDW: 13.6 % (ref 11.5–15.5)
WBC: 14.4 10*3/uL — ABNORMAL HIGH (ref 4.0–10.5)

## 2018-01-12 LAB — TYPE AND SCREEN
ABO/RH(D): O POS
Antibody Screen: NEGATIVE

## 2018-01-12 LAB — ABO/RH: ABO/RH(D): O POS

## 2018-01-12 LAB — POCT FERN TEST: POCT Fern Test: POSITIVE

## 2018-01-12 MED ORDER — OXYCODONE-ACETAMINOPHEN 5-325 MG PO TABS: 1.0000 | ORAL_TABLET | ORAL | Status: DC | PRN

## 2018-01-12 MED ORDER — EPHEDRINE 5 MG/ML INJ
10.0000 mg | INTRAVENOUS | Status: DC | PRN
  Filled 2018-01-12: qty 2

## 2018-01-12 MED ORDER — LACTATED RINGERS IV SOLN
500.0000 mL | INTRAVENOUS | Status: DC | PRN
  Administered 2018-01-12: 500 mL via INTRAVENOUS
  Administered 2018-01-12: 1000 mL via INTRAVENOUS

## 2018-01-12 MED ORDER — LIDOCAINE HCL (PF) 1 % IJ SOLN
30.0000 mL | INTRAMUSCULAR | Status: AC | PRN
  Administered 2018-01-13: 30 mL via SUBCUTANEOUS
  Filled 2018-01-12: qty 30

## 2018-01-12 MED ORDER — DIPHENHYDRAMINE HCL 50 MG/ML IJ SOLN: 12.5000 mg | INTRAMUSCULAR | Status: DC | PRN

## 2018-01-12 MED ORDER — OXYTOCIN 40 UNITS IN LACTATED RINGERS INFUSION - SIMPLE MED
2.5000 [IU]/h | INTRAVENOUS | Status: DC
  Filled 2018-01-12: qty 1000

## 2018-01-12 MED ORDER — ACETAMINOPHEN 325 MG PO TABS: 650.0000 mg | ORAL_TABLET | ORAL | Status: DC | PRN

## 2018-01-12 MED ORDER — ONDANSETRON HCL 4 MG/2ML IJ SOLN
4.0000 mg | Freq: Four times a day (QID) | INTRAMUSCULAR | Status: DC | PRN
  Administered 2018-01-13: 4 mg via INTRAVENOUS
  Filled 2018-01-12: qty 2

## 2018-01-12 MED ORDER — PHENYLEPHRINE 40 MCG/ML (10ML) SYRINGE FOR IV PUSH (FOR BLOOD PRESSURE SUPPORT)
PREFILLED_SYRINGE | INTRAVENOUS | Status: AC
  Filled 2018-01-12: qty 20

## 2018-01-12 MED ORDER — SOD CITRATE-CITRIC ACID 500-334 MG/5ML PO SOLN: 30.0000 mL | ORAL | Status: DC | PRN

## 2018-01-12 MED ORDER — SODIUM CHLORIDE 0.9 % IV SOLN
5.0000 10*6.[IU] | Freq: Once | INTRAVENOUS | Status: AC
  Administered 2018-01-12: 5 10*6.[IU] via INTRAVENOUS
  Filled 2018-01-12: qty 5

## 2018-01-12 MED ORDER — LIDOCAINE HCL (PF) 1 % IJ SOLN
INTRAMUSCULAR | Status: DC | PRN
  Administered 2018-01-12 (×2): 5 mL via EPIDURAL

## 2018-01-12 MED ORDER — OXYTOCIN BOLUS FROM INFUSION
500.0000 mL | Freq: Once | INTRAVENOUS | Status: AC
  Administered 2018-01-13: 500 mL via INTRAVENOUS

## 2018-01-12 MED ORDER — FENTANYL 2.5 MCG/ML BUPIVACAINE 1/10 % EPIDURAL INFUSION (WH - ANES)
INTRAMUSCULAR | Status: AC
  Filled 2018-01-12: qty 100

## 2018-01-12 MED ORDER — PHENYLEPHRINE 40 MCG/ML (10ML) SYRINGE FOR IV PUSH (FOR BLOOD PRESSURE SUPPORT)
80.0000 ug | PREFILLED_SYRINGE | INTRAVENOUS | Status: DC | PRN
  Administered 2018-01-12 – 2018-01-13 (×2): 80 ug via INTRAVENOUS
  Filled 2018-01-12: qty 5
  Filled 2018-01-12: qty 10

## 2018-01-12 MED ORDER — FENTANYL 2.5 MCG/ML BUPIVACAINE 1/10 % EPIDURAL INFUSION (WH - ANES)
14.0000 mL/h | INTRAMUSCULAR | Status: DC | PRN
  Administered 2018-01-12 (×2): 14 mL/h via EPIDURAL
  Filled 2018-01-12 (×2): qty 100

## 2018-01-12 MED ORDER — OXYCODONE-ACETAMINOPHEN 5-325 MG PO TABS
2.0000 | ORAL_TABLET | ORAL | Status: DC | PRN
  Administered 2018-01-13: 2 via ORAL
  Filled 2018-01-12: qty 2

## 2018-01-12 MED ORDER — PHENYLEPHRINE 40 MCG/ML (10ML) SYRINGE FOR IV PUSH (FOR BLOOD PRESSURE SUPPORT)
80.0000 ug | PREFILLED_SYRINGE | INTRAVENOUS | Status: AC | PRN
  Administered 2018-01-12 (×3): 80 ug via INTRAVENOUS

## 2018-01-12 MED ORDER — FLEET ENEMA 7-19 GM/118ML RE ENEM: 1.0000 | ENEMA | RECTAL | Status: DC | PRN

## 2018-01-12 MED ORDER — LACTATED RINGERS IV SOLN
INTRAVENOUS | Status: DC
  Administered 2018-01-12 – 2018-01-13 (×3): via INTRAVENOUS

## 2018-01-12 MED ORDER — LACTATED RINGERS IV SOLN: 500.0000 mL | Freq: Once | INTRAVENOUS | Status: DC

## 2018-01-12 MED ORDER — PENICILLIN G 3 MILLION UNITS IVPB - SIMPLE MED
3.0000 10*6.[IU] | INTRAVENOUS | Status: DC
  Administered 2018-01-12 – 2018-01-13 (×4): 3 10*6.[IU] via INTRAVENOUS
  Filled 2018-01-12 (×2): qty 100
  Filled 2018-01-12: qty 3
  Filled 2018-01-12: qty 100
  Filled 2018-01-12 (×2): qty 3

## 2018-01-12 MED FILL — Sodium Chloride IV Soln 0.9%: INTRAVENOUS | Qty: 100 | Status: AC

## 2018-01-12 MED FILL — Penicillin G Potassium For Inj 5000000 Unit: INTRAMUSCULAR | Qty: 3 | Status: AC

## 2018-01-12 NOTE — H&P (Addendum)
OBSTETRIC ADMISSION HISTORY AND PHYSICAL  Cassie Petersen is a 32 y.o. female G2P0010 with IUP at 2224w0d by LMP presenting for PROM. Her water broke at 5am this morning.  She started having contractions at 6am and currently rates them at a 7/10 every 5 minutes. They have been increaseing in intensity.  She reports +FMs, no VB, no blurry vision, headaches or peripheral edema, and RUQ pain.  She plans on breast and feeding. She request OCP for birth control. [facility]   Sono:    @[redacted]w[redacted]d , CWD, normal anatomy, cephalic presentation,  3666g, 90% EFW   Prenatal History/Complications: GBS positive  Past Medical History: Past Medical History:  Diagnosis Date  . 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 HPV in female   . Vaginal Pap smear, abnormal     Past Surgical History: Past Surgical History:  Procedure Laterality Date  . APPENDECTOMY    . BREAST SURGERY      Obstetrical History: OB History    Gravida  2   Para  0   Term      Preterm      AB  1   Living        SAB  1   TAB      Ectopic      Multiple      Live Births              Social History: Social History   Socioeconomic History  . Marital status: Married    Spouse name: Not on file  . Number of children: Not on file  . Years of education: Not on file  . Highest education level: Not on file  Occupational History  . Not on file  Social Needs  . Financial resource strain: Not hard at all  . Food insecurity:    Worry: Never true    Inability: Never true  . Transportation needs:    Medical: No    Non-medical: No  Tobacco Use  . Smoking status: Never Smoker  . Smokeless tobacco: Never Used  Substance and Sexual Activity  . Alcohol use: Not Currently    Frequency: Never  . Drug use: Never  . Sexual activity: Yes    Birth control/protection: None  Lifestyle  . Physical activity:    Days per week: 7 days    Minutes per session: 30 min  . Stress: Patient  refused  Relationships  . Social connections:    Talks on phone: Patient refused    Gets together: Patient refused    Attends religious service: Patient refused    Active member of club or organization: Patient refused    Attends meetings of clubs or organizations: Patient refused    Relationship status: Patient refused  Other Topics Concern  . Not on file  Social History Narrative  . Not on file    Family History: No family history on file.  Allergies: No Known Allergies  Medications Prior to Admission  Medication Sig Dispense Refill Last Dose  . hydrOXYzine (VISTARIL) 25 MG capsule Take 1 capsule (25 mg total) by mouth at bedtime as needed. (Patient taking differently: Take 25 mg by mouth at bedtime as needed for anxiety or itching. ) 14 capsule 0 01/11/2018 at Unknown time  . Prenatal Vit-Fe Fumarate-FA (MULTIVITAMIN-PRENATAL) 27-0.8 MG TABS tablet Take 1 tablet by mouth daily at 12 noon.   01/11/2018 at Unknown time     Review of Systems   All systems  reviewed and negative except as stated in HPI  Blood pressure 118/75, pulse 90, temperature 98.1 F (36.7 C), temperature source Oral, resp. rate 18, height 5\' 8"  (1.727 m), weight 62.6 kg, last menstrual period 04/14/2017. General appearance: alert, cooperative, appears stated age and mild distress Lungs: clear to auscultation bilaterally Heart: regular rate and rhythm Abdomen: gravid, non-tender; bowel sounds norma Extremities: Homans sign is negative, no sign of DVT Presentation: cephalic Fetal monitoringBaseline: 120 bpm, Variability: Good {> 6 bpm), Accelerations: Reactive and Decelerations: Absent Uterine activityDate/time of onset: 6am today, Frequency: Every 5 minutes and Intensity: moderate Dilation: 1.5 Effacement (%): 80 Station: -2 Exam by:: Dr. Penne LashLeggett   Prenatal labs: ABO, Rh: --/--/O POS (08/12 0945) Antibody: PENDING (08/12 0945) Rubella: Immune (02/06 0000) RPR: NON-REACTIVE (05/29 0907)  HBsAg:  Negative (02/06 0000)  HIV: NON-REACTIVE (05/29 0907)  GBS: Positive (07/31 0000)  1 hr Glucola 75/134/113 Genetic screening  Normal NIPS Anatomy US normal   Prenatal Transfer Tool  Maternal Diabetes: No Genetic Screening: Normal Maternal Ultrasounds/Referrals: Normal Fetal Ultrasounds or other Referrals:  None Maternal Substance Abuse:  No Significant Maternal Medications:  None Significant Maternal Lab Results: Lab values include: Group B Strep positive  Results for orders placed or performed during the hospital encounter of 01/12/18 (from the past 24 hour(s))  POCT fern test   Collection Time: 01/12/18  9:37 AM  Result Value Ref Range   POCT Fern Test Positive = ruptured amniotic membanes   Type and screen [facility]   Collection Time: 01/12/18  9:45 AM  Result Value Ref Range   ABO/RH(D) O POS    Antibody Screen PENDING    Sample Expiration      01/15/2018 Performed at [facility], [address] [place], [place] [address]   CBC   Collection Time: 01/12/18 11:18 AM  Result Value Ref Range   WBC 14.4 (H) 4.0 - 10.5 K/uL   RBC 4.38 3.87 - 5.11 MIL/uL   Hemoglobin 14.0 12.0 - 15.0 g/dL   HCT 29.540.3 28.436.0 - 13.246.0 %   MCV 92.0 78.0 - 100.0 fL   MCH 32.0 26.0 - 34.0 pg   MCHC 34.7 30.0 - 36.0 g/dL   RDW 44.013.6 10.211.5 - 72.515.5 %   Platelets 185 150 - 400 K/uL    Patient Active Problem List   Diagnosis Date Noted  . Normal labor 01/12/2018  . Large for gestational age fetus affecting management of mother, antepartum 01/03/2018  . Supervision of other normal pregnancy, antepartum 10/08/2017    Assessment/Plan:  Cassie Petersen is a 32 y.o. G2P0010 at 6126w0d here for PROM.   #Labor: will start pitocin. #Pain: Epidural upon maternal request #FWB: Cat. I #ID:  GBS pos. - PCN #MOF: both #MOC:OCP #Circ:  Inpatient  Sandre Kittyaniel K Olson, MD  01/12/2018, 1:17 PM  OB FELLOW HISTORY AND PHYSICAL ATTESTATION  I have seen and examined this patient; I agree  with above documentation in the resident's note.   Patient with cervical change without Pitocin. Will continue to monitor. Patient agreeable to augmentation with Pitocin if needed. Epidural in place. GBS+, prophylaxis with PCN. Sono notable for @[redacted]w[redacted]d , CWD, normal anatomy, cephalic presentation,  3666g, 90% EFW. FWB Cat I. Ctx q3-4 min. Plan for inpatient circ.   Marcy Sirenatherine Momodou Consiglio, D.O. OB Fellow  01/12/2018, 6:51 PM

## 2018-01-12 NOTE — Anesthesia Preprocedure Evaluation (Signed)

## 2018-01-12 NOTE — Progress Notes (Signed)
Cassie JewelsLindsey Petersen is a 32 y.o. G2P0010 at 4650w0d admitted for SOL.  Subjective: Has epidural, is comfortable now.  Objective: BP 113/73   Pulse 87   Temp 98.3 F (36.8 C) (Oral)   Resp 16   Ht 5\' 8"  (1.727 m)   Wt 62.6 kg   LMP 04/14/2017   SpO2 99%   BMI 20.98 kg/m  No intake/output data recorded. No intake/output data recorded.  FHT:  FHR: 110 bpm, variability: moderate,  accelerations:  Present,  decelerations:  Absent UC:   regular, every 5 minutes SVE:   Dilation: 6.5 Effacement (%): 90 Station: -1 Exam by:: Dr. Truddie Cocoell  Labs: Lab Results  Component Value Date   WBC 14.4 (H) 01/12/2018   HGB 14.0 01/12/2018   HCT 40.3 01/12/2018   MCV 92.0 01/12/2018   PLT 185 01/12/2018    Assessment / Plan: Spontaneous labor, progressing normally.   Labor: Progressing normally Preeclampsia:  no signs or symptoms of toxicity Fetal Wellbeing:  Category I ROM: SROM 0500 Pain Control:  Epidural I/D:  GBS pos, pcn on board Anticipated MOD:  NSVD   Candis SchatzPatricia Shakhia Gramajo, DO Family Medicine, PGY-3

## 2018-01-12 NOTE — Anesthesia Procedure Notes (Signed)
Epidural Patient location during procedure: OB  Staffing Anesthesiologist: Witten Certain, MD Performed: anesthesiologist   Preanesthetic Checklist Completed: patient identified, site marked, surgical consent, pre-op evaluation, timeout performed, IV checked, risks and benefits discussed and monitors and equipment checked  Epidural Patient position: sitting Prep: DuraPrep Patient monitoring: heart rate, continuous pulse ox and blood pressure Approach: midline Location: L3-L4 Injection technique: LOR saline  Needle:  Needle type: Tuohy  Needle gauge: 17 G Needle length: 9 cm and 9 Needle insertion depth: 5 cm Catheter type: closed end flexible Catheter size: 20 Guage Catheter at skin depth: 9 cm Test dose: negative  Assessment Events: blood not aspirated, injection not painful, no injection resistance, negative IV test and no paresthesia  Additional Notes Patient identified. Risks/Benefits/Options discussed with patient including but not limited to bleeding, infection, nerve damage, paralysis, failed block, incomplete pain control, headache, blood pressure changes, nausea, vomiting, reactions to medication both or allergic, itching and postpartum back pain. Confirmed with bedside nurse the patient's most recent platelet count. Confirmed with patient that they are not currently taking any anticoagulation, have any bleeding history or any family history of bleeding disorders. Patient expressed understanding and wished to proceed. All questions were answered. Sterile technique was used throughout the entire procedure. Please see nursing notes for vital signs. Test dose was given through epidural needle and negative prior to continuing to dose epidural or start infusion. Warning signs of high block given to the patient including shortness of breath, tingling/numbness in hands, complete motor block, or any concerning symptoms with instructions to call for help. Patient was given  instructions on fall risk and not to get out of bed. All questions and concerns addressed with instructions to call with any issues.     

## 2018-01-12 NOTE — MAU Note (Signed)
Pt c/o "huge gush of fluid" expelled at 5am today. Pt with contraction approx 7-8 min apart. Pos FM. No vaginal bldg. Acute onset of nausea with cxts now. No vomiting.   Adah Perlhandra Sadi Arave RN

## 2018-01-12 NOTE — Anesthesia Pain Management Evaluation Note (Signed)
  CRNA Pain Management Visit Note  Patient: Cassie JewelsLindsey Blessinger, 32 y.o., female  "Hello I am a [facility] of the anesthesia [facility]. We have an anesthesia team available at all times to provide care throughout the hospital, including epidural management and anesthesia for C-section. I don't know your plan for the delivery whether it a natural birth, water birth, IV sedation, nitrous supplementation, doula or epidural, but we want to meet your pain goals."   1.Was your pain managed to your expectations on prior hospitalizations?   No prior hospitalizations  2.What is your expectation for pain management during this hospitalization?     Epidural  3.How can we help you reach that goal? Epidural when ready  Record the patient's initial score and the patient's pain goal.   Pain: 6  Pain Goal: 8 [facility] wants you to be able to say your pain was always managed very well.  Edison PaceWILKERSON,Phillippa Straub 01/12/2018

## 2018-01-13 ENCOUNTER — Encounter (HOSPITAL_COMMUNITY): Payer: Self-pay | Admitting: *Deleted

## 2018-01-13 DIAGNOSIS — O99824 Streptococcus B carrier state complicating childbirth: Secondary | ICD-10-CM

## 2018-01-13 DIAGNOSIS — Z3A39 39 weeks gestation of pregnancy: Secondary | ICD-10-CM

## 2018-01-13 LAB — RPR: RPR: NONREACTIVE

## 2018-01-13 MED ORDER — OXYTOCIN 40 UNITS IN LACTATED RINGERS INFUSION - SIMPLE MED
1.0000 m[IU]/min | INTRAVENOUS | Status: DC
  Administered 2018-01-13: 4 m[IU]/min via INTRAVENOUS
  Administered 2018-01-13: 8 m[IU]/min via INTRAVENOUS
  Administered 2018-01-13: 2 m[IU]/min via INTRAVENOUS

## 2018-01-13 MED ORDER — ACETAMINOPHEN 325 MG PO TABS
650.0000 mg | ORAL_TABLET | ORAL | Status: DC | PRN
  Administered 2018-01-14 – 2018-01-15 (×3): 650 mg via ORAL
  Filled 2018-01-13 (×3): qty 2

## 2018-01-13 MED ORDER — SENNOSIDES-DOCUSATE SODIUM 8.6-50 MG PO TABS
2.0000 | ORAL_TABLET | ORAL | Status: DC
  Administered 2018-01-13 – 2018-01-14 (×2): 2 via ORAL
  Filled 2018-01-13 (×2): qty 2

## 2018-01-13 MED ORDER — SIMETHICONE 80 MG PO CHEW: 80.0000 mg | CHEWABLE_TABLET | ORAL | Status: DC | PRN

## 2018-01-13 MED ORDER — DIPHENHYDRAMINE HCL 25 MG PO CAPS: 25.0000 mg | ORAL_CAPSULE | Freq: Four times a day (QID) | ORAL | Status: DC | PRN

## 2018-01-13 MED ORDER — IBUPROFEN 600 MG PO TABS
600.0000 mg | ORAL_TABLET | Freq: Four times a day (QID) | ORAL | Status: DC
  Administered 2018-01-13 – 2018-01-15 (×7): 600 mg via ORAL
  Filled 2018-01-13 (×8): qty 1

## 2018-01-13 MED ORDER — OXYCODONE HCL 5 MG PO TABS
5.0000 mg | ORAL_TABLET | ORAL | Status: DC | PRN
  Administered 2018-01-13 – 2018-01-15 (×6): 5 mg via ORAL
  Filled 2018-01-13 (×7): qty 1

## 2018-01-13 MED ORDER — ZOLPIDEM TARTRATE 5 MG PO TABS: 5.0000 mg | ORAL_TABLET | Freq: Every evening | ORAL | Status: DC | PRN

## 2018-01-13 MED ORDER — TETANUS-DIPHTH-ACELL PERTUSSIS 5-2.5-18.5 LF-MCG/0.5 IM SUSP: 0.5000 mL | Freq: Once | INTRAMUSCULAR | Status: DC

## 2018-01-13 MED ORDER — BENZOCAINE-MENTHOL 20-0.5 % EX AERO
1.0000 "application " | INHALATION_SPRAY | CUTANEOUS | Status: DC | PRN
  Filled 2018-01-13: qty 56

## 2018-01-13 MED ORDER — COCONUT OIL OIL: 1.0000 "application " | TOPICAL_OIL | Status: DC | PRN

## 2018-01-13 MED ORDER — ONDANSETRON HCL 4 MG/2ML IJ SOLN: 4.0000 mg | INTRAMUSCULAR | Status: DC | PRN

## 2018-01-13 MED ORDER — ONDANSETRON HCL 4 MG PO TABS: 4.0000 mg | ORAL_TABLET | ORAL | Status: DC | PRN

## 2018-01-13 MED ORDER — HYDROXYZINE PAMOATE 25 MG PO CAPS
25.0000 mg | ORAL_CAPSULE | Freq: Every evening | ORAL | Status: DC | PRN
  Filled 2018-01-13: qty 1

## 2018-01-13 MED ORDER — PRENATAL MULTIVITAMIN CH
1.0000 | ORAL_TABLET | Freq: Every day | ORAL | Status: DC
  Administered 2018-01-14: 1 via ORAL
  Filled 2018-01-13 (×2): qty 1

## 2018-01-13 MED ORDER — TERBUTALINE SULFATE 1 MG/ML IJ SOLN
0.2500 mg | Freq: Once | INTRAMUSCULAR | Status: DC | PRN
  Filled 2018-01-13: qty 1

## 2018-01-13 MED ORDER — DIBUCAINE 1 % RE OINT: 1.0000 "application " | TOPICAL_OINTMENT | RECTAL | Status: DC | PRN

## 2018-01-13 MED ORDER — WITCH HAZEL-GLYCERIN EX PADS: 1.0000 "application " | MEDICATED_PAD | CUTANEOUS | Status: DC | PRN

## 2018-01-13 NOTE — Anesthesia Postprocedure Evaluation (Signed)
Anesthesia Post Note  Patient: Cassie Petersen  Procedure(s) Performed: AN AD HOC LABOR EPIDURAL     Patient location during evaluation: Mother Baby Anesthesia Type: Epidural Level of consciousness: awake and alert and patient uncooperative Pain management: pain level controlled Vital Signs Assessment: post-procedure vital signs reviewed and stable Respiratory status: spontaneous breathing, nonlabored ventilation and respiratory function stable Cardiovascular status: stable Postop Assessment: no headache, no backache, epidural receding and able to ambulate Anesthetic complications: no    Last Vitals:  Vitals:   01/13/18 1100 01/13/18 1130  BP: 111/67 (!) 103/57  Pulse: (!) 109 96  Resp: 18 16  Temp:  36.6 C  SpO2:  98%    Last Pain:  Vitals:   01/13/18 1130  TempSrc: Oral  PainSc: 3    Pain Goal: Patients Stated Pain Goal: 3 (01/13/18 1100)               Minerva AreolaYATES,Ronel Rodeheaver

## 2018-01-13 NOTE — Progress Notes (Signed)
Labor Progress Note Cassie Petersen is a 32 y.o. G2P0010 at 3188w1d presented for SROM and labor  S:  Comfortable with epidural.  O:  BP 114/71   Pulse (!) 102   Temp 97.8 F (36.6 C) (Axillary)   Resp 17   Ht 5\' 8"  (1.727 m)   Wt 62.6 kg   LMP 04/14/2017   SpO2 99%   BMI 20.98 kg/m  EFM: baseline 125 bpm/ mod variability/ + accels/ no decels  Toco: 4-5 SVE: 6.5/90/-2   A/P: 32 y.o. G2P0010 8388w1d  1. Labor: active, protracted 2. FWB: Cat I 3. Pain: epidural  Recommend Pitocin augmentation, pt agrees. Anticipate SVD.  Cassie Petersen, CNM 2:12 AM

## 2018-01-14 MED ORDER — CYCLOBENZAPRINE HCL 5 MG PO TABS
5.0000 mg | ORAL_TABLET | Freq: Three times a day (TID) | ORAL | Status: AC | PRN
  Administered 2018-01-14 – 2018-01-15 (×3): 5 mg via ORAL
  Filled 2018-01-14 (×4): qty 1

## 2018-01-14 NOTE — Progress Notes (Signed)
Post Partum Day 1 Subjective: Patient complaining of pain "from shoulders to toes." She rates the pain at 5-6 on a scale of 1-10. Endorses pain with inhalation when up and moving around. Reports that the pain medicine that she has been receiving has not helped and heating pads do not get hot enough to help. She reports that her bleeding is decreasing. Breastfeeding is going well and she plans on both breast and bottle feeding eventually. She plans for progesterone-only pill for contraception.  Objective: Blood pressure 96/73, pulse 88, temperature 97.9 F (36.6 C), temperature source Oral, resp. rate 20, height 5\' 8"  (1.727 m), weight 62.6 kg, last menstrual period 04/14/2017, SpO2 98 %, unknown if currently breastfeeding.  Physical Exam:  General: alert and no distress Lochia: appropriate Uterine Fundus: firm Incision: n/a DVT Evaluation: No evidence of DVT seen on physical exam.  Recent Labs    01/12/18 1118  HGB 14.0  HCT 40.3    Assessment/Plan: Cassie Petersen is a 32 year-old G2P1011 who is PPD #1 from SVD. Will continue to work on pain control with Tylenol and oxycodone. Plan for circ. Plan for discharge tomorrow.   LOS: 2 days   Natasha MeadBryan A Tarvaris Puglia 01/14/2018, 6:55 AM

## 2018-01-14 NOTE — Progress Notes (Signed)
Have encouraged patient multiple times to walk the hallway, have not seen patient walking during my shift. Heating pad given to help with pain control.

## 2018-01-14 NOTE — Lactation Note (Signed)
This note was copied from a baby's chart. Lactation Consultation Note  Patient Name: Cassie Petersen YNWGN'FToday's Date: 01/14/2018 Reason for consult: Initial assessment;Term;1st time breastfeeding  Visited with P1 Mom and FOB.  Baby 29 hrs old, and Mom states baby has been latching well.  Mom turned on her side and wants to rest.  Baby sleeping in crib. Reviewed importance of STS, and feeding baby often on cue.  Goal of >8 feedings per 24 hrs.  Breast massage and hand expression reviewed.  Mom reports no discomfort with latch. Lactation brochure left with Mom.  Mom aware of IP and OP Lactation services available to her.    Encouraged Mom to call prn   Consult Status Consult Status: Follow-up Date: 01/15/18 Follow-up type: In-patient    Cassie Petersen, Cassie Petersen 01/14/2018, 2:34 PM

## 2018-01-15 ENCOUNTER — Encounter: Payer: BLUE CROSS/BLUE SHIELD | Admitting: Obstetrics & Gynecology

## 2018-01-15 MED ORDER — CYCLOBENZAPRINE HCL 5 MG PO TABS: 5.0000 mg | ORAL_TABLET | Freq: Three times a day (TID) | ORAL | 0 refills | Status: DC | PRN

## 2018-01-15 MED ORDER — OXYCODONE HCL 5 MG PO TABS: 5.0000 mg | ORAL_TABLET | ORAL | 0 refills | Status: DC | PRN

## 2018-01-15 MED ORDER — IBUPROFEN 600 MG PO TABS: 600.0000 mg | ORAL_TABLET | Freq: Four times a day (QID) | ORAL | 0 refills | Status: DC

## 2018-01-15 NOTE — Lactation Note (Signed)
This note was copied from a baby's chart. Lactation Consultation Note  Patient Name: Cassie Kalman JewelsLindsey Dulac RUEAV'WToday's Date: 01/15/2018 Reason for consult: Follow-up assessment;Term;Infant weight loss Baby is 48 hours  LC reviewed and updated the doc flow sheets per mom and dad.  Baby fussy, LC assisted dad to change the diaper - wet noted,  Mom wanted to hold the baby after diaper change and baby settled down.  LC reviewed potential feeding patterns with 8% weight loss, and the increase  With cluster feeding. Importance of STS feedings until the baby is back up  To birth weight and gaining steadily. Nutritive vs non - nutritive feeding patterns,  And to watch for hanging out latched.  Mom mentioned nipples are sensitive. LC recommended prior to latch - breast massage,  Hand express, pre-pump with Hika to prime the milk ducts.  Latch with breast compressions until swallows and then intermittent.  Sore nipple and engorgement prevention and tx reviewed.  Per mom has DEBP at home.  Per mom feeling alittle over whelmed with when the baby doesn't latch  Right away. Using the cross cradle position and football.  LC reviewed.  Also the pages of how to know breast feeding is going well and signs need to call the [facility].  Mother informed of post-discharge support and given phone number to the lactation department, including services for phone call assistance; out-patient appointments; and breastfeeding support group. List of other breastfeeding resources in the community given in the handout. Encouraged mother to call for problems or concerns related to breastfeeding.  Maternal Data    Feeding Feeding Type: Breast Fed Length of feed: 20 min  LATCH Score                   Interventions Interventions: Breast feeding basics reviewed  Lactation Tools Discussed/Used Pump Review: Setup, frequency, and cleaning;Milk Storage   Consult Status Consult Status: Complete Date:  01/15/18    Cassie Petersen 01/15/2018, 9:21 AM

## 2018-01-15 NOTE — Discharge Summary (Signed)
OB Discharge Summary     Patient Name: Cassie Petersen DOB: [birth year] MRN: [id number]  Date of admission: 01/12/2018 Delivering MD: Donette LarryBHAMBRI, MELANIE   Date of discharge: 01/15/2018  Admitting diagnosis: 32WKS,WATER BROKE Intrauterine pregnancy: 8770w1d     Secondary diagnosis:  Active Problems:   Normal labor  Additional problems: n/a     Discharge diagnosis: Term Pregnancy Delivered                                                                                                Post partum procedures:none  Augmentation: Pitocin  Complications: None  Hospital course:  Onset of Labor With Vaginal Delivery     32 y.o. yo G2P1011 at 4870w1d was admitted in Active Labor on 01/12/2018. Patient had an uncomplicated labor course as follows:  Membrane Rupture Time/Date: 5:00 AM ,01/12/2018   Intrapartum Procedures: Episiotomy: None [1]                                         Lacerations:  Labial [10]  Patient had a delivery of a Viable infant. 01/13/2018  Information for the patient's newborn:  Keturah BarreOrlando, Boy Perle [[id number]][[id number]]  Delivery Method: Vaginal, Spontaneous(Filed from Delivery Summary)    Pateint had an uncomplicated postpartum course. She reported shoulder and neck pain that was controlled with Flexeril. She is ambulating, tolerating a regular diet, passing flatus, and urinating well. Patient is discharged home in stable condition on 01/15/18.   Physical exam  Vitals:   01/14/18 0110 01/14/18 1423 01/14/18 2142 01/15/18 0637  BP: 96/73 (!) 96/53 104/64 105/70  Pulse: 88 79 97 78  Resp: 20 17 16    Temp: 97.9 F (36.6 C) 98 F (36.7 C) 98.5 F (36.9 C) 97.9 F (36.6 C)  TempSrc: Oral Oral Oral Oral  SpO2:   99%   Weight:      Height:       General: alert, no distress Lochia: appropriate Uterine Fundus: firm Incision: N/A DVT Evaluation: No evidence of DVT seen on physical exam. Labs: Lab Results  Component Value Date   WBC 14.4 (H) 01/12/2018   HGB 14.0  01/12/2018   HCT 40.3 01/12/2018   MCV 92.0 01/12/2018   PLT 185 01/12/2018   No flowsheet data found.  Discharge instruction: per After Visit Summary and "Baby and Me Booklet".  After visit meds:  Allergies as of 01/15/2018   No Known Allergies     Medication List    TAKE these medications   cyclobenzaprine 5 MG tablet Commonly known as:  FLEXERIL Take 1 tablet (5 mg total) by mouth 3 (three) times daily as needed for muscle spasms.   hydrOXYzine 25 MG capsule Commonly known as:  VISTARIL Take 1 capsule (25 mg total) by mouth at bedtime as needed. What changed:  reasons to take this   ibuprofen 600 MG tablet Commonly known as:  ADVIL,MOTRIN Take 1 tablet (600 mg total) by mouth every 6 (six) hours.   multivitamin-prenatal 27-0.8 MG Tabs tablet Take  1 tablet by mouth daily at 12 noon.   oxyCODONE 5 MG immediate release tablet Commonly known as:  Oxy IR/ROXICODONE Take 1 tablet (5 mg total) by mouth every 4 (four) hours as needed (pain scale 4-7).       Diet: routine diet  Activity: Advance as tolerated. Pelvic rest for 6 weeks.   Outpatient follow up:4 weeks Follow up Appt:No future appointments. Follow up Visit:No follow-ups on file.  Postpartum contraception: Progesterone only pills  Newborn Data: Live born female  Birth Weight: 8 lb 5.2 oz (3776 g) APGAR: 9, 9  Newborn Delivery   Birth date/time:  01/13/2018 08:43:00 Delivery type:  Vaginal, Spontaneous     Baby Feeding: Breast Disposition:home with mother   01/15/2018 De Hollingsheadatherine L Wallace, DO

## 2018-01-15 NOTE — Discharge Instructions (Signed)
Postpartum Care After Vaginal Delivery °The period of time right after you deliver your newborn is called the postpartum period. °What kind of medical care will I receive? °· You may continue to receive fluids and medicines through an IV tube inserted into one of your veins. °· If an incision was made near your vagina (episiotomy) or if you had some vaginal tearing during delivery, cold compresses may be placed on your episiotomy or your tear. This helps to reduce pain and swelling. °· You may be given a squirt bottle to use when you go to the bathroom. You may use this until you are comfortable wiping as usual. To use the squirt bottle, follow these steps: °? Before you urinate, fill the squirt bottle with warm water. Do not use hot water. °? After you urinate, while you are sitting on the toilet, use the squirt bottle to rinse the area around your urethra and vaginal opening. This rinses away any urine and blood. °? You may do this instead of wiping. As you start healing, you may use the squirt bottle before wiping yourself. Make sure to wipe gently. °? Fill the squirt bottle with clean water every time you use the bathroom. °· You will be given sanitary pads to wear. °How can I expect to feel? °· You may not feel the need to urinate for several hours after delivery. °· You will have some soreness and pain in your abdomen and vagina. °· If you are breastfeeding, you may have uterine contractions every time you breastfeed for up to several weeks postpartum. Uterine contractions help your uterus return to its normal size. °· It is normal to have vaginal bleeding (lochia) after delivery. The amount and appearance of lochia is often similar to a menstrual period in the first week after delivery. It will gradually decrease over the next few weeks to a dry, yellow-brown discharge. For most women, lochia stops completely by 6-8 weeks after delivery. Vaginal bleeding can vary from woman to woman. °· Within the first few  days after delivery, you may have breast engorgement. This is when your breasts feel heavy, full, and uncomfortable. Your breasts may also throb and feel hard, tightly stretched, warm, and tender. After this occurs, you may have milk leaking from your breasts. Your health care provider can help you relieve discomfort due to breast engorgement. Breast engorgement should go away within a few days. °· You may feel more sad or worried than normal due to hormonal changes after delivery. These feelings should not last more than a few days. If these feelings do not go away after several days, speak with your health care provider. °How should I care for myself? °· Tell your health care provider if you have pain or discomfort. °· Drink enough water to keep your urine clear or pale yellow. °· Wash your hands thoroughly with soap and water for at least 20 seconds after changing your sanitary pads, after using the toilet, and before holding or feeding your baby. °· If you are not breastfeeding, avoid touching your breasts a lot. Doing this can make your breasts produce more milk. °· If you become weak or lightheaded, or you feel like you might faint, ask for help before: °? Getting out of bed. °? Showering. °· Change your sanitary pads frequently. Watch for any changes in your flow, such as a sudden increase in volume, a change in color, the passing of large blood clots. If you pass a blood clot from your vagina, save it   to show to your health care provider. Do not flush blood clots down the toilet without having your health care provider look at them. °· Make sure that all your vaccinations are up to date. This can help protect you and your baby from getting certain diseases. You may need to have immunizations done before you leave the hospital. °· If desired, talk with your health care provider about methods of family planning or birth control (contraception). °How can I start bonding with my baby? °Spending as much time as  possible with your baby is very important. During this time, you and your baby can get to know each other and develop a bond. Having your baby stay with you in your room (rooming in) can give you time to get to know your baby. Rooming in can also help you become comfortable caring for your baby. Breastfeeding can also help you bond with your baby. °How can I plan for returning home with my baby? °· Make sure that you have a car seat installed in your vehicle. °? Your car seat should be checked by a certified car seat installer to make sure that it is installed safely. °? Make sure that your baby fits into the car seat safely. °· Ask your health care provider any questions you have about caring for yourself or your baby. Make sure that you are able to contact your health care provider with any questions after leaving the hospital. °This information is not intended to replace advice given to you by your health care provider. Make sure you discuss any questions you have with your health care provider. °Document Released: 03/17/2007 Document Revised: 10/23/2015 Document Reviewed: 04/24/2015 °Elsevier Interactive Patient Education © 2018 [employer] ° °

## 2018-01-15 NOTE — Progress Notes (Signed)
EPDS = 12. Discussed s/s of PP depression/anxiety and to let OB provider know if pt is having symptoms (done with discharge instructions). [name], [name] L

## 2018-01-16 ENCOUNTER — Ambulatory Visit: Payer: Self-pay

## 2018-01-16 ENCOUNTER — Encounter (HOSPITAL_COMMUNITY): Payer: Self-pay

## 2018-01-16 ENCOUNTER — Encounter (HOSPITAL_COMMUNITY): Payer: Self-pay | Admitting: *Deleted

## 2018-01-16 NOTE — Lactation Note (Signed)
This note was copied from a baby's chart. 01/16/2018  Name: Cassie Petersen MRN: [id number] Date of Birth: 01/13/2018 Gestational Age: Gestational Age: 8054w1d Birth Weight: 133.2 oz Weight today:    7 pounds 9.8 ounces (3454 grams) with clean newborn diaper   Infant presents today with mom and dad for feeding assistance at request of Dr. Jerrell Mylar'Kelley.   Infant has lost 4 grams since discharge. Mom's milk is in as of last night.   Infant is feeding every 2 hours during the day and every hour at night. Mom is reporting significant pain with feedings to the point where she is not sure she can continue to BF infant.   Breasts are full. Nipples are everted with faint bruising noted. Nipples are rounded post some feedings and mom reports decreased pain and asymmetrical when mom reports the most pain with feeding. Nipples were sore with pumping and mom needed suction to stay on the lowest level.   Infant needs upper and lower lip flanged with feeding after latch. With the second latch, mom did not get any relief from the pain and the nipple was asymmetrical. Enc mom to relatch as needed. Infant only fed on one breast and went to sleep, he responded well to breast massage with more active feeding. Breast with softening after feeding. Reviewed pre pumping or hand expressing and post pumping for comfort.   Right breast was very full and firm as infant did not feed on it. Mom pumped with her Spectra 1 pump with little relief. Engorgement for Nursing Mother's Handout given and reviewed, enc mom to try ice packs prior to latch and waking infant to feed as needed.   Infant is noted to have labial and posterior lingual frenulum. Parents were informed of findings and how they may effect BF and milk supply long term. Parents given website information and local providers. Plan to reassess next week.   Reviewed with mom that often first time moms have inelastic breast and areola tissue that should become more  elastic with time and this too can be contributing to her pain and discomfort. Mom voiced understanding. Reviewed importance of prepumping if breasts are full to allow infant to latch deeper. Reviewed resting the nipples if needed and pumping and giving infant breast milk in a bottle if needed.   # 24 NS was given and mom was shown how to apply and use. Enc mom to use as needed for nipple pain. Infant would not relatch once NS was placed. Reviewed to nurse as much as she can without it but to use as needed. Reviewed risks of decreased milk supply with usage. Reviewed that resting the nipples are ok if mom needs to.   Infant is to follow up with Pediatrician tomorrow. [facility], enc them to allow weight checks on the infant when they are called. Mom aware of BF Support Groups. Mom would like to follow up with Lactation in 1 week.   Mom reports all questions have been answered today. Mom to call with further questions/concerns as needed.   General Information: Mother's reason for visit: feeding assessment, MD request Consult: Initial Lactation consultant: Noralee StainSharon Kewan Mcnease RN,IBCLC Breastfeeding experience: very painful to BF, Cluster feeding a lot   Maternal medications: Pre-natal vitamin, Motrin (ibuprofen), Other(Oxycodone, Muscle Relaxant)  Breastfeeding History: Frequency of breast feeding: every 2 hours during the day, every hour at night Duration of feeding: 10-20 minutes  Supplementation:  Pump type: Spectra Pump frequency: none    Infant Output Assessment: Voids per 24 hours: 4 Urine color: Clear yellow Stools per 24 hours: 6 Stool color: Brown  Breast Assessment: Breast: Full Nipple: Erect, Other, Reddened(bruised, faint positional stripes) Pain level: 9(9 with initial latch, 4-8 after latch) Pain interventions: Bra, Expressed breast milk, Lanolin, Other(Silver Cups)  Feeding Assessment: Infant oral assessment:  Variance Infant oral assessment comment: infant with thick labial frenulum that inserts at the bootom of the gum ridge. infant with sucking blister to top center lip. Upper lip is tight and blanches with flanging, upper lip needs flanging at the breast. infant with short posterior lingual frenulum. infant with good tongue laterlaization and extension. infant with limited mid tongue elevation.  infant wtih strong suckle on gloved finger with good tongue cupping and extension. BF is very painful to mom and nipple is often asymmetrical post latch.  Positioning: Cross cradle(left breast) Latch: 2 - Grasps breast easily, tongue down, lips flanged, rhythmical sucking. Audible swallowing: 2 - Spontaneous and intermittent Type of nipple: 2 - Everted at rest and after stimulation Comfort: 0 - Engorged, cracked, bleeding, large blisters, severe discomfort Hold: 1 - Assistance needed to correctly position infant at breast and maintain latch LATCH score: 7 Latch assessment: Deep Lips flanged: No(upper and lower lip needed flanging with each latch) Suck assessment: Displays both   Pre-feed weight: 3454 grams Post feed weight: 3480 grams Amount transferred: 26 ml + nursed again for about 2 minutes with good swallows noted Amount supplemented: 0  Additional Feeding Assessment:                                    Totals: Total amount transferred: 26 + ml Total supplement given: 0 Total amount pumped post feed: 5 ml from right breast   Plan:  1. Offer infant the breast with feeding cues 2. Express milk from breast if really full prior to feeding with pump, haakaa or hand expression to soften areola 3. Feed infant skin to skin 4. Massage or intermittently compress breast with feeding to keep infant actively suckling 5. Can use the # 24 Nipple Shield with feeding as needed for pain 6. Allow infant to empty first breast before offering second breast 7. If breasts are still full after  feedings, can pump for comfort or express for comfort afterwards 8. Follow the Engorgement handout if breasts are not softening with feeding or pumping 9. If nipples are too sore to nurse, pump both breasts to empty and feed infant with a bottle 10. If using the nipple shield with most feedings, it is recommended that you pump 4-6 x a day after breast feeding to protect milk supply 11. Keep up the good work 12. Call with any questions/concerns as needed [phone] 13. Thank you for allowing me to assist you today 14. Follow up with Lactation in 1 week   Ed BlalockSharon S Jizel Cheeks RN, IBCLC                                                      Ed BlalockSharon S Liviya Santini 01/16/2018, 3:16 PM

## 2018-01-19 ENCOUNTER — Ambulatory Visit (INDEPENDENT_AMBULATORY_CARE_PROVIDER_SITE_OTHER): Payer: BLUE CROSS/BLUE SHIELD | Admitting: Obstetrics & Gynecology

## 2018-01-19 ENCOUNTER — Encounter: Payer: Self-pay | Admitting: Obstetrics & Gynecology

## 2018-01-19 NOTE — Progress Notes (Signed)
   Subjective:    Patient ID: Cassie Petersen, female    DOB: 04-Jun-1985, 32 y.o.   MRN: [id number]  HPI 32 yo married P1 (7 day old son) here with concerns about her right labia minora. She doesn't think that it has healed correctly. She is experiencing a lot of nipple pain with breast feeding.   Review of Systems     Objective:   Physical Exam Breathing, conversing, and ambulating normally Well nourished, well hydrated White female, no apparent distress Vulva- right labia minora with horizontal tear, not attatched together     Assessment & Plan:  Torn labia- schedule repair in the OR in about 2 months Nipple pain- rec'd lanolin and that she alternate feedings for the baby with pumping I offered an SSRI prn as she is having some baby blues. At this time she declines.

## 2018-01-20 ENCOUNTER — Other Ambulatory Visit: Payer: Self-pay | Admitting: Obstetrics & Gynecology

## 2018-01-20 ENCOUNTER — Encounter (HOSPITAL_COMMUNITY): Payer: Self-pay

## 2018-01-20 MED ORDER — OXYCODONE-ACETAMINOPHEN 5-325 MG PO TABS: 1.0000 | ORAL_TABLET | ORAL | 0 refills | Status: DC | PRN

## 2018-01-20 MED ORDER — BENZOCAINE (TOPICAL) 20 % EX AERO: INHALATION_SPRAY | CUTANEOUS | 0 refills | Status: DC

## 2018-01-20 NOTE — Progress Notes (Signed)
Benzocaine spray and percocet #10 prescribed for vulvar pain. She reports that the nipple pain is much better after she pumped some.

## 2018-01-29 ENCOUNTER — Telehealth: Payer: Self-pay | Admitting: *Deleted

## 2018-01-29 MED ORDER — FLUCONAZOLE 150 MG PO TABS: ORAL_TABLET | ORAL | 0 refills | Status: DC

## 2018-01-29 NOTE — Telephone Encounter (Signed)
Pt called stating that her vaginal was red,and extremely itchy.  She had her spouse get some Vagisil last night and that made it worse and certainly didn't help it.  I told her to stear away from Vagisil.  I recommended Diflucan 150 mg PO to take now and repeat in 3 days if needed.  I encouraged her to put cold pak to area during the day to help with the discomfort and swelling.  [facility] was [facility] to [facility]

## 2018-02-12 ENCOUNTER — Ambulatory Visit (INDEPENDENT_AMBULATORY_CARE_PROVIDER_SITE_OTHER): Payer: BLUE CROSS/BLUE SHIELD | Admitting: Obstetrics & Gynecology

## 2018-02-12 ENCOUNTER — Encounter: Payer: Self-pay | Admitting: Obstetrics & Gynecology

## 2018-02-12 DIAGNOSIS — Z1389 Encounter for screening for other disorder: Secondary | ICD-10-CM | POA: Diagnosis not present

## 2018-02-12 DIAGNOSIS — Z23 Encounter for immunization: Secondary | ICD-10-CM

## 2018-02-12 NOTE — Progress Notes (Signed)
Post Partum Exam  Cassie Petersen is a 32 y.o. 723P1011 female who presents for a postpartum visit. She is 4 weeks postpartum following a spontaneous vaginal delivery. I have fully reviewed the prenatal and intrapartum course. The delivery was at 39 gestational weeks.  Anesthesia: epidural with a Rt labia minora split which is scheduled for repair in Oct. Postpartum course has been evenful with labial pain.. Baby's course has been uneventful. Baby is feeding by breast. Bleeding staining only. Bowel function is normal. Bladder function is normal. Patient is not sexually active. Contraception method is OCP's . Postpartum depression screening:neg  The following portions of the patient's history were reviewed and updated as appropriate: allergies, current medications, past family history, past medical history, past social history, past surgical history and problem list. Pap done in 2019 and normal   Review of Systems Pertinent items are noted in HPI.    Objective:  unknown if currently breastfeeding.  General:  alert   Breasts:  inspection negative, no nipple discharge or bleeding, no masses or nodularity palpable  Lungs: clear to auscultation bilaterally  Heart:  regular rate and rhythm, S1, S2 normal, no murmur, click, rub or gallop  Abdomen: soft, non-tender; bowel sounds normal; no masses,  no organomegaly   Vulva:  normal  Vagina: right labia minora split horizonitally  Cervix:  anteverted  Corpus: normal  Adnexa:  normal adnexa  Rectal Exam: Not performed.        Assessment:    Normal postpartum exam. Pap smear not done at today's visit.   Plan:   1. Contraception: she is considering options 2. Flu vaccine today for both parents 3. She will message me with her contraceptive choice 4. Repair of labia will be done in the OR next month

## 2018-03-10 ENCOUNTER — Encounter (HOSPITAL_BASED_OUTPATIENT_CLINIC_OR_DEPARTMENT_OTHER): Payer: Self-pay | Admitting: *Deleted

## 2018-03-16 NOTE — Progress Notes (Signed)
Spoke with Decinda., the scheduler for Dr. Ellin Saba office. I informed her that the the staff at [facility] in [facility] have made multiple attempts to get in contact with her and left several messages with no return phone call from the pt. Achilles Dunk stated that she would be dropping the pt from the schedule and her surgery was being canceled. his pt has been called left voice messages

## 2018-03-18 ENCOUNTER — Encounter (HOSPITAL_BASED_OUTPATIENT_CLINIC_OR_DEPARTMENT_OTHER): Payer: Self-pay

## 2018-03-18 ENCOUNTER — Ambulatory Visit (HOSPITAL_BASED_OUTPATIENT_CLINIC_OR_DEPARTMENT_OTHER): Admit: 2018-03-18 | Payer: BLUE CROSS/BLUE SHIELD | Admitting: Obstetrics & Gynecology

## 2018-03-18 SURGERY — SUTURE REPAIR, LACERATION, PERINEUM
Anesthesia: Choice

## 2018-05-28 DIAGNOSIS — R05 Cough: Secondary | ICD-10-CM | POA: Diagnosis not present

## 2018-10-30 ENCOUNTER — Encounter: Payer: Self-pay | Admitting: Plastic Surgery

## 2018-10-30 ENCOUNTER — Other Ambulatory Visit: Payer: Self-pay

## 2018-10-30 ENCOUNTER — Ambulatory Visit (INDEPENDENT_AMBULATORY_CARE_PROVIDER_SITE_OTHER): Payer: Self-pay | Admitting: Plastic Surgery

## 2018-10-30 DIAGNOSIS — Z719 Counseling, unspecified: Secondary | ICD-10-CM

## 2018-10-30 NOTE — Progress Notes (Signed)
Botulinum Toxin Procedure Note  Procedure: Cosmetic botulinum toxin  Pre-operative Diagnosis: Dynamic rhytides   Post-operative Diagnosis: Same  Complications:  None  Brief history: The patient desires botulinum toxin injection of her forehead. I discussed with the patient this proposed procedure of botulinum toxin injections, which is customized depending on the particular needs of the patient. It is performed on facial rhytids as a temporary correction. The alternatives were discussed with the patient. The risks were addressed including bleeding, scarring, infection, damage to deeper structures, asymmetry, and chronic pain, which may occur infrequently after a procedure. The individual's choice to undergo a surgical procedure is based on the comparison of risks to potential benefits. Other risks include unsatisfactory results, brow ptosis, eyelid ptosis, allergic reaction, temporary paralysis, which should go away with time, bruising, blurring disturbances and delayed healing. Botulinum toxin injections do not arrest the aging process or produce permanent tightening of the eyelid.  Operative intervention maybe necessary to maintain the results of a blepharoplasty or botulinum toxin. The patient understands and wishes to proceed. An informed consent was signed and informational brochures given to her prior to the procedure.  Procedure: The area was prepped with alcohol and dried with a clean gauze. Using a clean technique, the botulinum toxin was diluted with 1.25 cc of preservative-free normal saline which was slowly injected with an 18 gauge needle in a tuberculin syringes.  A 32 gauge needles were then used to inject the botulinum toxin. This mixture allow for an aliquot of 5 units per 0.1 cc in each injection site.    Subsequently the mixture was injected in the glabellar and forehead area with preservation of the temporal branch to the lateral eyebrow as well as into each lateral canthal area  beginning from the lateral orbital rim medial to the zygomaticus major in 3 separate areas. A total of 36 Units of botulinum toxin was used. The forehead and glabellar area was injected with care to inject intramuscular only while holding pressure on the supratrochlear vessels in each area during each injection on either side of the medial corrugators. The injection proceeded vertically superiorly to the medial 2/3 of the frontalis muscle and superior 2/3 of the lateral frontalis, again with preservation of the frontal branch. No complications were noted. Light pressure was held for 5 minutes. She was instructed explicitly in post-operative care.  Botox LOT:  [id number] C2 EXP:  12/22

## 2018-11-05 ENCOUNTER — Telehealth: Payer: Self-pay | Admitting: Plastic Surgery

## 2018-11-05 NOTE — Telephone Encounter (Signed)

## 2018-11-06 ENCOUNTER — Encounter: Payer: Self-pay | Admitting: Plastic Surgery

## 2018-11-06 ENCOUNTER — Ambulatory Visit (INDEPENDENT_AMBULATORY_CARE_PROVIDER_SITE_OTHER): Payer: Self-pay | Admitting: Plastic Surgery

## 2018-11-06 ENCOUNTER — Other Ambulatory Visit: Payer: Self-pay

## 2018-11-06 VITALS — BP 106/71 | HR 76 | Temp 98.5°F | Ht 68.0 in | Wt 115.6 lb

## 2018-11-06 DIAGNOSIS — Z719 Counseling, unspecified: Secondary | ICD-10-CM

## 2018-11-06 NOTE — Progress Notes (Signed)
Preoperative Dx: hyperpigmentation  Postoperative Dx:  same  Procedure: laser to face   Anesthesia: none  Description of Procedure:  Risks and complications were explained to the patient. Consent was confirmed and signed. Time out was called and all information was confirmed to be correct. The area  area was prepped with alcohol and wiped dry. The IPL 530 laser was set at 7.4 J/cm2. The face was lasered. The patient tolerated the procedure well and there were no complications. The patient is to follow up in 4 weeks.

## 2018-11-06 NOTE — Progress Notes (Signed)
Botulinum Toxin Procedure Note  Procedure: Cosmetic botulinum toxin   Pre-operative Diagnosis: Dynamic rhytides   Post-operative Diagnosis: Same  Complications:  None  Brief history: The patient desires botulinum toxin injection of her forehead. I discussed with the patient this proposed procedure of botulinum toxin injections, which is customized depending on the particular needs of the patient. It is performed on facial rhytids as a temporary correction. The alternatives were discussed with the patient. The risks were addressed including bleeding, scarring, infection, damage to deeper structures, asymmetry, and chronic pain, which may occur infrequently after a procedure. The individual's choice to undergo a surgical procedure is based on the comparison of risks to potential benefits. Other risks include unsatisfactory results, brow ptosis, eyelid ptosis, allergic reaction, temporary paralysis, which should go away with time, bruising, blurring disturbances and delayed healing. Botulinum toxin injections do not arrest the aging process or produce permanent tightening of the eyelid.  Operative intervention maybe necessary to maintain the results of a blepharoplasty or botulinum toxin. The patient understands and wishes to proceed. An informed consent was signed and informational brochures given to her prior to the procedure.  Procedure: The area was prepped with alcohol and dried with a clean gauze. Using a clean technique, the botulinum toxin was diluted with 1.25 cc of preservative-free normal saline which was slowly injected with an 18 gauge needle in a tuberculin syringes.  A 31 gauge needles were then used to inject the botulinum toxin. This mixture allow for an aliquot of 5 units per 0.1 cc in each injection site.    Subsequently the mixture was injected in the glabellar and forehead area laterally with preservation of the temporal branch to the lateral eyebrow.  A total of 10 Units of botulinum  toxin was used.  No complications were noted. Light pressure was held for 5 minutes. She was instructed explicitly in post-operative care.  Botox LOT:  [id number] C2 EXP:  12/22

## 2018-12-31 ENCOUNTER — Telehealth: Payer: Self-pay | Admitting: *Deleted

## 2018-12-31 MED ORDER — NORGESTIMATE-ETH ESTRADIOL 0.25-35 MG-MCG PO TABS: 1.0000 | ORAL_TABLET | Freq: Every day | ORAL | 11 refills | Status: DC

## 2018-12-31 NOTE — Telephone Encounter (Signed)
Pt called to request BC pills.  Per Dr Alease Medina last office note she had told the patient to call once she decided on [facility] and it would be sent in.  Pt has decided on Sprintec and the RX was sent to [facility] in [place].

## 2019-01-25 ENCOUNTER — Other Ambulatory Visit: Payer: Self-pay

## 2019-01-25 ENCOUNTER — Ambulatory Visit (INDEPENDENT_AMBULATORY_CARE_PROVIDER_SITE_OTHER): Payer: BLUE CROSS/BLUE SHIELD | Admitting: Family Medicine

## 2019-01-25 ENCOUNTER — Encounter: Payer: Self-pay | Admitting: Family Medicine

## 2019-01-25 ENCOUNTER — Ambulatory Visit (INDEPENDENT_AMBULATORY_CARE_PROVIDER_SITE_OTHER): Payer: BC Managed Care – PPO

## 2019-01-25 VITALS — BP 118/64 | HR 92 | Temp 98.4°F | Resp 16 | Ht 67.5 in | Wt 121.6 lb

## 2019-01-25 DIAGNOSIS — M549 Dorsalgia, unspecified: Secondary | ICD-10-CM

## 2019-01-25 DIAGNOSIS — M62838 Other muscle spasm: Secondary | ICD-10-CM

## 2019-01-25 DIAGNOSIS — M542 Cervicalgia: Secondary | ICD-10-CM | POA: Diagnosis not present

## 2019-01-25 DIAGNOSIS — Z3041 Encounter for surveillance of contraceptive pills: Secondary | ICD-10-CM

## 2019-01-25 MED ORDER — DICLOFENAC SODIUM 75 MG PO TBEC: 75.0000 mg | DELAYED_RELEASE_TABLET | Freq: Two times a day (BID) | ORAL | 0 refills | Status: DC

## 2019-01-25 MED ORDER — HYDROCODONE-ACETAMINOPHEN 5-325 MG PO TABS: 1.0000 | ORAL_TABLET | Freq: Four times a day (QID) | ORAL | 0 refills | Status: DC | PRN

## 2019-01-25 NOTE — Progress Notes (Signed)
Subjective  CC:  Chief Complaint  Patient presents with  . Establish Care    Recent move from [place]  . Shoulder Pain    Left, started about 1 week ago.Marland Kitchen. Has tried several OTC medications OB has given Flexeril no relief & Hydrocodone with 90% relief.. Has not had an injury    HPI: Cassie Petersen is a 33 y.o. female who presents to [facility] [place] today to establish care with me as a new patient.   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  She has no chronic problems.  I reviewed her OB history.  She is on no chronic medications.  However she reports that about once to twice per year she will experience acute onset left upper back pain that is above her left shoulder blade.  It is typically mild to moderate and will resolve spontaneously.  She has never had this medically evaluated before.  It started approximately 15 years ago.  Typical triggers will be yoga maneuvers, mechanical triggers and last week she traveled far in a car and believes that driving triggered it.  She reports severe left upper chronic back pain that is now interfering with sleep and her ability to use her arm.  She is having trouble taking care of her 804-year-old.  She has tried massage, heat, over-the-counter medications and had some leftover Flexeril and oxycodone which did help.  She denies any radicular symptoms.  No significant neck pain but has pain with certain neck movements.  No other joint problems or history of joint problems.  No history of trauma.  Assessment  1. Upper back pain on left side   2. Oral contraceptive use   3. Muscle spasm      Plan   Left trapezius muscle spasm and pain: Recommend anti-inflammatories twice daily for the next 2 weeks, massage, anti-inflammatories twice daily, referral to sports medicine for further evaluation and education  given her recurrent symptoms, worsening symptoms and her report of severe pain.  Hydrocodone given to be used if needed because patient is tearful due to severity of pain.  Drug database was reviewed.  Reassured.  Check cervical spine x-rays as well.  Continue birth control  Health maintenance visit due at patient's convenience  Follow up:  cpe Orders Placed This Encounter  Procedures  . DG Cervical Spine Complete  . Ambulatory referral to Sports Medicine   Meds ordered this encounter  Medications  . diclofenac (VOLTAREN) 75 MG EC tablet    Sig: Take 1 tablet (75 mg total) by mouth 2 (two) times daily.    Dispense:  30 tablet    Refill:  0  . HYDROcodone-acetaminophen (NORCO) 5-325 MG tablet    Sig: Take 1 tablet by mouth every 6 (six) hours as needed for moderate pain.    Dispense:  15 tablet    Refill:  0     Depression screen [facility] 01/25/2019 01/07/2018  Decreased Interest 0 3  Down, Depressed, Hopeless 0 2  PHQ - 2 Score 0 5  Altered sleeping - 3  Tired, decreased energy - 2  Change in appetite - 1  Feeling bad or failure about yourself  - 1  Trouble concentrating - 0  Moving slowly or fidgety/restless - 0  Suicidal thoughts - 0  PHQ-9 Score - 12  Difficult doing work/chores - Very difficult  We updated and reviewed the patient's past history in detail and it is documented below.  Patient Active Problem List   Diagnosis Date Noted  . Oral contraceptive use 01/25/2019  . Bicornuate uterus    Health Maintenance  Topic Date Due  . INFLUENZA VACCINE  01/02/2019  . PAP SMEAR-Modifier  07/08/2020  . TETANUS/TDAP  10/30/2027  . HIV Screening  Completed   Immunization History  Administered Date(s) Administered  . Influenza,inj,Quad PF,6+ Mos 02/12/2018  . Tdap 10/29/2017   Current Meds  Medication Sig  . cyclobenzaprine (FLEXERIL) 10 MG tablet Take 10 mg by mouth 3 (three) times daily as needed for muscle spasms.  . norgestimate-ethinyl estradiol  (ORTHO-CYCLEN) 0.25-35 MG-MCG tablet Take 1 tablet by mouth daily.  . [DISCONTINUED] HYDROcodone-acetaminophen (NORCO/VICODIN) 5-325 MG tablet Take 1 tablet by mouth every 6 (six) hours as needed for moderate pain.    Allergies: Patient has No Known Allergies. 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 Past Surgical History Patient  has a past surgical history that includes Multiple tooth extractions; Appendectomy; and Breast surgery. Family History: Patient family history includes Breast cancer (age of onset: 7150) in her maternal grandmother; Cancer in her paternal grandmother; Healthy in her father and son; Vision loss in her mother. Social History:  Patient  reports that she has never smoked. She has never used smokeless tobacco. She reports previous alcohol use. She reports that she does not use drugs.  Review of Systems: Constitutional: negative for fever or malaise Ophthalmic: negative for photophobia, double vision or loss of vision Cardiovascular: negative for chest pain, dyspnea on exertion, or new LE swelling Respiratory: negative for SOB or persistent cough Gastrointestinal: negative for abdominal pain, change in bowel habits or melena Genitourinary: negative for dysuria or gross hematuria Musculoskeletal: negative for new gait disturbance or muscular weakness no joint pain no weakness. Integumentary: negative for new or persistent rashes Neurological: negative for TIA or stroke symptoms Psychiatric: negative for SI or delusions Allergic/Immunologic: negative for hives  Patient Care Team    Relationship Specialty Notifications Start End  Willow OraAndy, Ahron Hulbert L, MD PCP - General Family Medicine  01/25/19   Patient, No Pcp Per  General Practice  10/16/17    Comment: Celene SkeenMerged  Leggett, Kelly H, MD Consulting Physician Obstetrics and  Gynecology  01/25/19     Objective  Vitals: BP 118/64   Pulse 92   Temp 98.4 F (36.9 C) (Tympanic)   Resp 16   Ht 5' 7.5" (1.715 m)   Wt 121 lb 9.6 oz (55.2 kg)   LMP 01/04/2019   SpO2 98%   BMI 18.76 kg/m  General:  Well developed, well nourished, no acute distress  Psych:  Alert and oriented,normal mood and affect HEENT:  Normocephalic, atraumatic, non-icteric sclera, PERRL, oropharynx is without mass or exudate, neck without adenopathy, mass or thyromegaly Cardiovascular:  RRR without gallop, rub or murmur, nondisplaced PMI Respiratory:  Good breath sounds bilaterally, CTAB with normal respiratory effort Gastrointestinal: normal bowel sounds, soft, non-tender, no noted masses. No HSM MSK: no deformities, contusions. Joints are without erythema or swelling, tenderness over left trapezius with muscle spasm present, no cervical spine tenderness, no thoracic spine tenderness Left shoulder: Full range of motion, no joint tenderness, swelling or erythema. Neck: Decreased range of motion due to pain, pain with left rotation and lateral flexion. Skin:  Warm, no rashes or suspicious  lesions noted Neurologic:    Mental status is normal. Gross motor and sensory exams are normal. Normal gait   Commons side effects, risks, benefits, and alternatives for medications and treatment plan prescribed today were discussed, and the patient expressed understanding of the given instructions. Patient is instructed to call or message via MyChart if he/she has any questions or concerns regarding our treatment plan. No barriers to understanding were identified. We discussed Red Flag symptoms and signs in detail. Patient expressed understanding regarding what to do in case of urgent or emergency type symptoms.   Medication list was reconciled, printed and provided to the patient in AVS. Patient instructions and summary information was reviewed with the patient as documented in the AVS. This note was prepared  with assistance of Dragon voice recognition software. Occasional wrong-word or sound-a-like substitutions may have occurred due to the inherent limitations of voice recognition software

## 2019-01-25 NOTE — Patient Instructions (Signed)
Please return for your complete physical. Please come fasting to that appointment.   We will call you with information regarding your referral appointment. Sports Medicine specialist. You should have an appointment shortly.   Use the diclofenac twice a day with food to calm down the pain. You may use the hydrocodone if needed as well.   It was a pleasure meeting you today! Thank you for choosing [place] to meet your healthcare needs! I truly look forward to working with you. If you have any questions or concerns, please send me a message via Mychart or call the office at [phone].

## 2019-01-28 ENCOUNTER — Telehealth: Payer: Self-pay | Admitting: Family Medicine

## 2019-01-28 NOTE — Telephone Encounter (Signed)
I have scheduled patient for 02/10/19 at 4pm.  She is currently in a lot of pain.  Would like to know if she can be worked in Brewing technologist.

## 2019-01-28 NOTE — Telephone Encounter (Signed)
Patient is calling to see if she can have her xrays and OV notes sent to [facility] [phone]

## 2019-01-29 NOTE — Telephone Encounter (Signed)
OV notes/xray report from 8/24 visit faxed to [facility]

## 2019-02-01 DIAGNOSIS — M542 Cervicalgia: Secondary | ICD-10-CM | POA: Diagnosis not present

## 2019-02-01 DIAGNOSIS — M25512 Pain in left shoulder: Secondary | ICD-10-CM | POA: Diagnosis not present

## 2019-02-01 DIAGNOSIS — M5442 Lumbago with sciatica, left side: Secondary | ICD-10-CM | POA: Diagnosis not present

## 2019-02-10 ENCOUNTER — Ambulatory Visit: Payer: BC Managed Care – PPO | Admitting: Family Medicine

## 2019-02-10 ENCOUNTER — Ambulatory Visit: Payer: BLUE CROSS/BLUE SHIELD | Admitting: Family Medicine

## 2019-02-10 NOTE — Progress Notes (Deleted)
Tawana ScaleZach Cleora Karnik D.O. [facility] [address] [place], [place] [address] Phone: [phone] Subjective:    I'm seeing this patient by the request  of:    CC:   [id number]:Subjective  Cassie JewelsLindsey Petersen is a 33 y.o. female coming in with complaint of ***  Onset-  Location Duration-  Character- Aggravating factors- Reliving factors-  Therapies tried-  Severity-     Past Medical History:  Diagnosis Date  . Bicornuate uterus   . 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 HPV in female   . Supervision of other normal pregnancy, antepartum 10/08/2017    Nursing Staff Provider Office Location  [place] Dating  [facility] Language  English Anatomy US  NML Flu Vaccine   Genetic Screen  NIPS:NML              AFP:       TDaP vaccine   10/29/17 Hgb A1C or  GTT   Third trimester F-75 1-134 2-113 Rhogam   n/a   LAB RESULTS  Feeding Plan Try breast Blood Type   O Pos Contraception OCPs (will let us know which brand) Antibody  Neg Circumcision yes Rube   Past Surgical History:  Procedure Laterality Date  . APPENDECTOMY    . BREAST SURGERY    . MULTIPLE TOOTH EXTRACTIONS     Social History   Socioeconomic History  . Marital status: Married    Spouse name: Not on file  . Number of children: 1  . Years of education: Not on file  . Highest education level: Not on file  Occupational History  . Not on file  Social Needs  . Financial resource strain: Not hard at all  . Food insecurity    Worry: Never true    Inability: Never true  . Transportation needs    Medical: No    Non-medical: No  Tobacco Use  . Smoking status: Never Smoker  . Smokeless tobacco: Never Used  Substance and Sexual Activity  . Alcohol use: Not Currently    Frequency: Never    Comment: social  . Drug use: Never  . Sexual activity: Yes    Birth control/protection: Pill  Lifestyle  . Physical activity    Days per week: 7 days    Minutes per session: 30 min  . Stress: Patient refused  Relationships  . Social  Musicianconnections    Talks on phone: Patient refused    Gets together: Patient refused    Attends religious service: Patient refused    Active member of club or organization: Patient refused    Attends meetings of clubs or organizations: Patient refused    Relationship status: Patient refused  Other Topics Concern  . Not on file  Social History Narrative   ** Merged History Encounter **       No Known Allergies Family History  Problem Relation Age of Onset  . Vision loss Mother   . Breast cancer Maternal Grandmother 50  . Cancer Paternal Grandmother   . Healthy Father   . Healthy Son     Current Outpatient Medications (Endocrine & Metabolic):  .  norgestimate-ethinyl estradiol (ORTHO-CYCLEN) 0.25-35 MG-MCG tablet, Take 1 tablet by mouth daily.    Current Outpatient Medications (Analgesics):  .  diclofenac (VOLTAREN) 75 MG EC tablet, Take 1 tablet (75 mg total) by mouth 2 (two) times daily. Marland Kitchen.  HYDROcodone-acetaminophen (NORCO) 5-325 MG tablet, Take 1 tablet by mouth every 6 (six) hours as needed for moderate  pain.   Current Outpatient Medications (Other):  .  cyclobenzaprine (FLEXERIL) 10 MG tablet, Take 10 mg by mouth 3 (three) times daily as needed for muscle spasms.    Past medical history, social, surgical and family history all reviewed in electronic medical record.  No pertanent information unless stated regarding to the chief complaint.   Review of Systems:  No headache, visual changes, nausea, vomiting, diarrhea, constipation, dizziness, abdominal pain, skin rash, fevers, chills, night sweats, weight loss, swollen lymph nodes, body aches, joint swelling, muscle aches, chest pain, shortness of breath, mood changes.   Objective  currently breastfeeding. Systems examined below as of    General: No apparent distress alert and oriented x3 mood and affect normal, dressed appropriately.  HEENT: Pupils equal, extraocular movements intact  Respiratory: Patient's speak in full  sentences and does not appear short of breath  Cardiovascular: No lower extremity edema, non tender, no erythema  Skin: Warm dry intact with no signs of infection or rash on extremities or on axial skeleton.  Abdomen: Soft nontender  Neuro: Cranial nerves II through XII are intact, neurovascularly intact in all extremities with 2+ DTRs and 2+ pulses.  Lymph: No lymphadenopathy of posterior or anterior cervical chain or axillae bilaterally.  Gait normal with good balance and coordination.  MSK:  Non tender with full range of motion and good stability and symmetric strength and tone of shoulders, elbows, wrist, hip, knee and ankles bilaterally.     Impression and Recommendations:     This case required medical decision making of moderate complexity. The above documentation has been reviewed and is accurate and complete Lyndal Pulley, DO       Note: This dictation was prepared with Dragon dictation along with smaller phrase technology. Any transcriptional errors that result from this process are unintentional.

## 2019-07-29 ENCOUNTER — Inpatient Hospital Stay (HOSPITAL_COMMUNITY)
Admission: AD | Admit: 2019-07-29 | Discharge: 2019-07-29 | Disposition: A | Discharge status: Home / Self Care | Payer: Managed Care, Other (non HMO) | Attending: Obstetrics and Gynecology | Admitting: Obstetrics and Gynecology

## 2019-07-29 ENCOUNTER — Other Ambulatory Visit: Payer: Self-pay

## 2019-07-29 DIAGNOSIS — N939 Abnormal uterine and vaginal bleeding, unspecified: Secondary | ICD-10-CM | POA: Insufficient documentation

## 2019-07-29 DIAGNOSIS — Z3202 Encounter for pregnancy test, result negative: Secondary | ICD-10-CM | POA: Diagnosis not present

## 2019-07-29 LAB — HCG, QUANTITATIVE, PREGNANCY: hCG, Beta Chain, Quant, S: 4 m[IU]/mL (ref <5)

## 2019-07-29 LAB — POCT PREGNANCY, URINE: Preg Test, Ur: NEGATIVE

## 2019-07-29 NOTE — MAU Provider Note (Signed)
S Ms. Cassie Petersen is a 34 y.o. G31P1011 female who presents to MAU today with complaint of VB in setting of positive HPT x2. Was supposed to start menses 3-4 days ago. She is trying to conceive.  O BP 119/74   Pulse 96   Temp 98 F (36.7 C)   Resp 16   Ht 5\' 8"  (1.727 m)   Wt 58.1 kg   LMP 06/28/2019   SpO2 100%   BMI 19.46 kg/m  Physical Exam  Nursing note and vitals reviewed. Constitutional: She is oriented to person, place, and time. She appears well-developed and well-nourished. No distress.  HENT:  Head: Normocephalic and atraumatic.  Cardiovascular: Normal rate.  Respiratory: Effort normal. No respiratory distress.  Musculoskeletal:        General: Normal range of motion.     Cervical back: Normal range of motion.  Neurological: She is alert and oriented to person, place, and time.  Psychiatric: She has a normal mood and affect.   MDM: Discussed with pt that UPT can be false positive or chemical pregnancy can occur which is not viable.   A Non pregnant female Medical screening exam complete  Results for orders placed or performed during the hospital encounter of 07/29/19 (from the past 24 hour(s))  Pregnancy, urine POC     Status: None   Collection Time: 07/29/19  9:04 AM  Result Value Ref Range   Preg Test, Ur NEGATIVE NEGATIVE  hCG, quantitative, pregnancy     Status: None   Collection Time: 07/29/19  9:37 AM  Result Value Ref Range   hCG, Beta Chain, Quant, S 4 <5 mIU/mL   P Discharge from MAU in stable condition Plans to f/u at [facility] as needed  Warning signs for worsening condition that would warrant emergency follow-up discussed Patient may return to MAU as needed for pregnancy related complaints  [facility], CNM 07/29/2019 10:57 AM

## 2019-07-29 NOTE — MAU Note (Signed)
.   Cassie Petersen is a 34 y.o. at Unknown here in MAU reporting: that she had a positive pregnancy test last Thursday and again on Monday. Started having vaginal bleeding today. Denies pain LMP: 06/28/19 Onset of complaint: today Pain score: 0 Vitals:   07/29/19 0923  BP: 119/74  Pulse: 96  Resp: 16  Temp: 98 F (36.7 C)  SpO2: 100%     FHT: Lab orders placed from triage: UPT/QUANT

## 2019-11-08 ENCOUNTER — Encounter: Payer: Managed Care, Other (non HMO) | Admitting: Obstetrics & Gynecology

## 2019-11-08 ENCOUNTER — Inpatient Hospital Stay (HOSPITAL_COMMUNITY): Payer: Managed Care, Other (non HMO)

## 2019-11-08 ENCOUNTER — Inpatient Hospital Stay (HOSPITAL_COMMUNITY)
Admission: AD | Admit: 2019-11-08 | Discharge: 2019-11-08 | Disposition: A | Discharge status: Home / Self Care | Payer: Managed Care, Other (non HMO) | Attending: Obstetrics and Gynecology | Admitting: Obstetrics and Gynecology

## 2019-11-08 ENCOUNTER — Other Ambulatory Visit: Payer: Self-pay

## 2019-11-08 ENCOUNTER — Encounter (HOSPITAL_COMMUNITY): Payer: Self-pay | Admitting: Obstetrics and Gynecology

## 2019-11-08 DIAGNOSIS — O200 Threatened abortion: Secondary | ICD-10-CM | POA: Diagnosis present

## 2019-11-08 DIAGNOSIS — O209 Hemorrhage in early pregnancy, unspecified: Secondary | ICD-10-CM | POA: Diagnosis not present

## 2019-11-08 DIAGNOSIS — Z3A10 10 weeks gestation of pregnancy: Secondary | ICD-10-CM | POA: Diagnosis not present

## 2019-11-08 LAB — URINALYSIS, ROUTINE W REFLEX MICROSCOPIC
Bilirubin Urine: NEGATIVE
Glucose, UA: NEGATIVE mg/dL
Ketones, ur: NEGATIVE mg/dL
Leukocytes,Ua: NEGATIVE
Nitrite: NEGATIVE
Protein, ur: 30 mg/dL — AB
Specific Gravity, Urine: 1.018 (ref 1.005–1.030)
pH: 5 (ref 5.0–8.0)

## 2019-11-08 LAB — POCT PREGNANCY, URINE: Preg Test, Ur: POSITIVE — AB

## 2019-11-08 NOTE — Discharge Instructions (Signed)
Threatened Miscarriage  A threatened miscarriage occurs when a woman has vaginal bleeding during the first 20 weeks of pregnancy but the pregnancy has not ended. If you have vaginal bleeding during this time, your health care provider will do tests to make sure you are still pregnant. If the tests show that you are still pregnant and that the developing baby (fetus) inside your uterus is still growing, your condition is considered a threatened miscarriage. A threatened miscarriage does not mean your pregnancy will end, but it does increase the risk of losing your pregnancy (complete miscarriage). What are the causes? The cause of this condition is usually not known. For women who go on to have a complete miscarriage, the most common cause is an abnormal number of chromosomes in the developing baby. Chromosomes are the structures inside cells that hold all of a person's genetic material. What increases the risk? The following lifestyle factors may increase your risk of a miscarriage in early pregnancy:  Smoking.  Drinking excessive amounts of alcohol or caffeine.  Recreational drug use. The following preexisting health conditions may increase your risk of a miscarriage in early pregnancy:  Polycystic ovary syndrome.  Uterine fibroids.  Infections.  Diabetes mellitus. What are the signs or symptoms? Symptoms of this condition include:  Vaginal bleeding.  Mild abdominal pain or cramps. How is this diagnosed? If you have bleeding with or without abdominal pain before 20 weeks of pregnancy, your health care provider will do tests to check whether you are still pregnant. These will include:  Ultrasound. This test uses sound waves to create images of the inside of your uterus. This allows your health care provider to look at your developing baby and other structures, such as your placenta.  Pelvic exam. This is an internal exam of your vagina and cervix.  Measurement of your baby's heart  rate.  Laboratory tests such as blood tests, urine tests, or swabs for infection You may be diagnosed with a threatened miscarriage if:  Ultrasound testing shows that you are still pregnant.  Your baby's heart rate is strong.  A pelvic exam shows that the opening between your uterus and your vagina (cervix) is closed.  Blood tests confirm that you are still pregnant. How is this treated? No treatments have been shown to prevent a threatened miscarriage from going on to a complete miscarriage. However, the right home care is important. Follow these instructions at home:  Get plenty of rest.  Do not have sex or use tampons if you have vaginal bleeding.  Do not douche.  Do not smoke or use recreational drugs.  Do not drink alcohol.  Avoid caffeine.  Keep all follow-up prenatal visits as told by your health care provider. This is important. Contact a health care provider if:  You have light vaginal bleeding or spotting while pregnant.  You have abdominal pain or cramping.  You have a fever. Get help right away if:  You have heavy vaginal bleeding.  You have blood clots coming from your vagina.  You pass tissue from your vagina.  You leak fluid, or you have a gush of fluid from your vagina.  You have severe low back pain or abdominal cramps.  You have fever, chills, and severe abdominal pain. Summary  A threatened miscarriage occurs when a woman has vaginal bleeding during the first 20 weeks of pregnancy but the pregnancy has not ended.  The cause of a threatened miscarriage is usually not known.  Symptoms of this condition may   include vaginal bleeding and mild abdominal pain or cramps.  No treatments have been shown to prevent a threatened miscarriage from going on to a complete miscarriage.  Keep all follow-up prenatal visits as told by your health care provider. This is important. This information is not intended to replace advice given to you by your health  care provider. Make sure you discuss any questions you have with your health care provider. Document Revised: 06/26/2017 Document Reviewed: 08/16/2016 Elsevier Patient Education  2020 [employer]vier [employer].  

## 2019-11-08 NOTE — MAU Provider Note (Signed)
Chief Complaint: Vaginal Bleeding and Possible Pregnancy   First Provider Initiated Contact with Patient 11/08/19 0932     SUBJECTIVE HPI: Cassie Petersen is a 34 y.o. G3P1011 at 12w1dwho presents to Maternity Admissions reporting vaginal bleeding. Symptoms started last night. Small gush of bright red blood and a small blood clot. Since then has not had to wear a pad but continues to see bright red blood on toilet paper when she wipes. Denies abdominal pain. No recent intercourse or exams. [place]   Past Medical History:  Diagnosis Date  . Bicornuate uterus   . 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 HPV in female   . Supervision of other normal pregnancy, antepartum 10/08/2017    Nursing Staff Provider Office Location  [place]  [place]  [place]  English Anatomy [place] N[place] Flu Vaccine   Genetic Screen  NIPS:NML              AFP:       TDaP vaccine   10/29/17 Hgb A1C or  GTT   Third trimester F-75 1-134 2-113 Rhogam   n/a   LAB RESULTS  Feeding Plan Try breast Blood Type   O Pos Contraception OCPs (will let [place]ore[place]now which brand) Antibody  Neg Circumcision yes Rube   OB History  Gravida Para Term Preterm AB Living  3 1 1  0 1 1  SAB TAB Ectopic Multiple Live Births  1 0 0   1    # Outcome Date GA Lbr Len/2nd Weight Sex Delivery Anes PTL Lv  3 Current           2 Term 01/13/18 314w1d5:57 / 01:46 3776 g M Vag-Spont EPI  LIV     Birth Comments: moulding  1 SAB            Past Surgical History:  Procedure Laterality Date  . APPENDECTOMY    . BREAST SURGERY    . MULTIPLE TOOTH EXTRACTIONS     Social History   Socioeconomic History  . Marital status: Married    Spouse name: Not on file  . Number of children: 1  . Years of education: Not on file  . Highest education level: Not on file  Occupational History  . Not on file  Tobacco Use  . Smoking status: Never Smoker  . Smokeless tobacco: Never Used  Substance and Sexual Activity   . Alcohol use: Not Currently    Comment: social  . Drug use: Never  . Sexual activity: Yes  Other Topics Concern  . Not on file  Social History Narrative   ** Merged History Encounter **       Social Determinants of Health   Financial Resource Strain:   . Difficulty of Paying Living Expenses:   Food Insecurity:   . Worried About RuCharity fundraisern the Last Year:   . RaArboriculturistn the Last Year:   Transportation Needs:   . LaFilm/video editorMedical):   . Marland Kitchenack of Transportation (Non-Medical):   Physical Activity:   . Days of Exercise per Week:   . Minutes of Exercise per Session:   Stress:   . Feeling of Stress :   Social Connections:   . Frequency of Communication with Friends and Family:   . Frequency of Social Gatherings with Friends and Family:   . Attends Religious Services:   . Active Member  of Clubs or Organizations:   . Attends Archivist Meetings:   Marland Kitchen Marital Status:   Intimate Partner Violence:   . Fear of Current or Ex-Partner:   . Emotionally Abused:   Marland Kitchen Physically Abused:   . Sexually Abused:    Family History  Problem Relation Age of Onset  . Vision loss Mother   . Breast cancer Maternal Grandmother 90  . Cancer Paternal Grandmother   . Healthy Father   . Healthy Son    No current facility-administered medications on file prior to encounter.   No current outpatient medications on file prior to encounter.   No Known Allergies  I have reviewed patient's Past Medical Hx, Surgical Hx, Family Hx, Social Hx, medications and allergies.   Review of Systems  Constitutional: Negative.   Gastrointestinal: Negative.   Genitourinary: Positive for vaginal bleeding. Negative for dysuria and vaginal discharge.    OBJECTIVE Patient Vitals for the past 24 hrs:  BP Temp Temp src Pulse Resp SpO2 Height Weight  11/08/19 1128 107/61 -- -- 86 16 -- -- --  11/08/19 0922 115/73 -- -- -- -- -- -- --  11/08/19 0900 124/90 98.4 F (36.9 C)  Oral (!) 103 17 95 % 5' 7"  (1.702 m) 57.7 kg   Constitutional: Well-developed, well-nourished female in no acute distress.  Cardiovascular: normal rate & rhythm, no murmur Respiratory: normal rate and effort. Lung sounds clear throughout GI: Abd soft, non-tender, Pos BS x 4. No guarding or rebound tenderness MS: Extremities nontender, no edema, normal ROM Neurologic: Alert and oriented x 4.  GU:  Pt declined   LAB RESULTS Results for orders placed or performed during the hospital encounter of 11/08/19 (from the past 24 hour(s))  Pregnancy, urine POC     Status: Abnormal   Collection Time: 11/08/19  8:58 AM  Result Value Ref Range   Preg Test, Ur POSITIVE (A) NEGATIVE  Urinalysis, Routine w reflex microscopic     Status: Abnormal   Collection Time: 11/08/19  9:24 AM  Result Value Ref Range   Color, Urine YELLOW YELLOW   APPearance HAZY (A) CLEAR   Specific Gravity, Urine 1.018 1.005 - 1.030   pH 5.0 5.0 - 8.0   Glucose, UA NEGATIVE NEGATIVE mg/dL   Hgb urine dipstick LARGE (A) NEGATIVE   Bilirubin Urine NEGATIVE NEGATIVE   Ketones, ur NEGATIVE NEGATIVE mg/dL   Protein, ur 30 (A) NEGATIVE mg/dL   Nitrite NEGATIVE NEGATIVE   Leukocytes,Ua NEGATIVE NEGATIVE   RBC / HPF 0-5 0 - 5 RBC/hpf   WBC, UA 6-10 0 - 5 WBC/hpf   Bacteria, UA RARE (A) NONE SEEN   Squamous Epithelial / LPF 11-20 0 - 5   Mucus PRESENT     IMAGING US OB LESS THAN 14 WEEKS WITH OB TRANSVAGINAL  Result Date: 11/08/2019 CLINICAL DATA:  Pregnant, bleeding EXAM: OBSTETRIC <14 WK ULTRASOUND TECHNIQUE: Transabdominal ultrasound was performed for evaluation of the gestation as well as the maternal uterus and adnexal regions. COMPARISON:  None. FINDINGS: Intrauterine gestational sac: Single gestational sac identified in left horn of bicornuate uterus. Yolk sac:  Visualized. Embryo:  Not Visualized. Cardiac Activity: Not Visualized. Heart Rate: MSD:  23 mm   7 w   2 d Subchorionic hemorrhage:  None visualized. Maternal  uterus/adnexae: Bicornuate uterus. Multiple small bilateral ovarian follicles. IMPRESSION: There is a single gestational sac identified in the left horn of a bicornuate uterus, with a yolk sac identified but without fetal pole or cardiac  activity. Mean sac diameter 23 mm. Early pregnancy of uncertain viability; recommend follow-up ultrasound in 7 days to assess for continued development and viability. Electronically Signed   By: Eddie Candle M.D.   On: 11/08/2019 10:35    MAU COURSE Orders Placed This Encounter  Procedures  . US OB LESS THAN 14 WEEKS WITH OB TRANSVAGINAL  . Urinalysis, Routine w reflex microscopic  . Pregnancy, urine POC  . Discharge patient   No orders of the defined types were placed in this encounter.   MDM RH positive  Declined labs or pelvic exam. Wants ultrasound to evaluate.   Ultrasound shows 23 mm IUGS with yolk sac, no embryo. Suspicious for failed pregnancy but has not met criteria.  Reviewed results with patient. Very sure of LMP & had positive pregnancy test 6+ weeks ago. Discussed with her that this is very likely a miscarriage but will not make diagnosis at this time. Will schedule for follow up in the office. This is a desired pregnancy.   ASSESSMENT 1. Threatened miscarriage   2. Vaginal bleeding in pregnancy, first trimester     PLAN Discharge home in stable condition. SAB precautions Msg to [place] for follow up   Allergies as of 11/08/2019   No Known Allergies     Medication List    You have not been prescribed any medications.      Jorje Guild, NP 11/08/2019  11:54 AM

## 2019-11-08 NOTE — MAU Note (Signed)
Started bleeding last night, small gush then one dime sized clot.  Now only seeing when wipes, bright red.  Denies pain.  First appt is in 2wks.

## 2019-11-09 ENCOUNTER — Other Ambulatory Visit: Payer: Self-pay

## 2019-11-09 DIAGNOSIS — O3680X0 Pregnancy with inconclusive fetal viability, not applicable or unspecified: Secondary | ICD-10-CM

## 2019-11-09 NOTE — Progress Notes (Signed)
U/S for viability placed per Dr.Leggett. Dr.Leggett would like U/S scheduled for Monday 6/14. If miscarriage is determined by U/S Dr.Leggett wants pt treated with Cytotec protocol. Pt needs CBC drawn before given Cytotec. Appts will be scheduled for U/S and blood draw.

## 2019-11-11 ENCOUNTER — Other Ambulatory Visit: Payer: Self-pay | Admitting: Obstetrics & Gynecology

## 2019-11-11 MED ORDER — HYDROCODONE-ACETAMINOPHEN 5-325 MG PO TABS: ORAL_TABLET | ORAL | 0 refills | Status: DC

## 2019-11-11 NOTE — Progress Notes (Signed)
Pt preparing for cytotec management of miscarriage.  Norco prescribed.

## 2019-11-15 ENCOUNTER — Other Ambulatory Visit: Payer: Self-pay

## 2019-11-15 ENCOUNTER — Ambulatory Visit (INDEPENDENT_AMBULATORY_CARE_PROVIDER_SITE_OTHER): Payer: Managed Care, Other (non HMO)

## 2019-11-15 ENCOUNTER — Other Ambulatory Visit (INDEPENDENT_AMBULATORY_CARE_PROVIDER_SITE_OTHER): Payer: Managed Care, Other (non HMO)

## 2019-11-15 DIAGNOSIS — O039 Complete or unspecified spontaneous abortion without complication: Secondary | ICD-10-CM | POA: Diagnosis not present

## 2019-11-15 DIAGNOSIS — Z3687 Encounter for antenatal screening for uncertain dates: Secondary | ICD-10-CM | POA: Diagnosis not present

## 2019-11-15 DIAGNOSIS — O3680X0 Pregnancy with inconclusive fetal viability, not applicable or unspecified: Secondary | ICD-10-CM

## 2019-11-15 DIAGNOSIS — Z3A11 11 weeks gestation of pregnancy: Secondary | ICD-10-CM | POA: Diagnosis not present

## 2019-11-15 NOTE — Progress Notes (Signed)
Pt here for CBC only before her Pelvic U/S to confirm missed AB.  Pt has decided that she does not want Cytotec at this time.  She is thinking more about either D&C or Natural expulsion.  Husband is on vacation this week and childcare would be available.  Will notify Dr Penne Lash today with the report, as pt would like to speak with her about her decision.

## 2019-11-16 ENCOUNTER — Other Ambulatory Visit (HOSPITAL_COMMUNITY)
Admission: RE | Admit: 2019-11-16 | Discharge: 2019-11-16 | Disposition: A | Discharge status: Home / Self Care | Payer: Managed Care, Other (non HMO) | Source: Ambulatory Visit | Attending: Obstetrics & Gynecology | Admitting: Obstetrics & Gynecology

## 2019-11-16 ENCOUNTER — Encounter (HOSPITAL_COMMUNITY): Payer: Self-pay | Admitting: Obstetrics & Gynecology

## 2019-11-16 ENCOUNTER — Other Ambulatory Visit: Payer: Self-pay

## 2019-11-16 DIAGNOSIS — Z20822 Contact with and (suspected) exposure to covid-19: Secondary | ICD-10-CM | POA: Diagnosis not present

## 2019-11-16 DIAGNOSIS — Z01812 Encounter for preprocedural laboratory examination: Secondary | ICD-10-CM | POA: Diagnosis present

## 2019-11-16 LAB — CBC
HCT: 41.6 % (ref 35.0–45.0)
Hemoglobin: 13.7 g/dL (ref 11.7–15.5)
MCH: 31.1 pg (ref 27.0–33.0)
MCHC: 32.9 g/dL (ref 32.0–36.0)
MCV: 94.5 fL (ref 80.0–100.0)
MPV: 9.8 fL (ref 7.5–12.5)
Platelets: 238 10*3/uL (ref 140–400)
RBC: 4.4 10*6/uL (ref 3.80–5.10)
RDW: 12.2 % (ref 11.0–15.0)
WBC: 7.8 10*3/uL (ref 3.8–10.8)

## 2019-11-16 LAB — SARS CORONAVIRUS 2 (TAT 6-24 HRS): SARS Coronavirus 2: NEGATIVE

## 2019-11-16 NOTE — Progress Notes (Addendum)
Cassie Petersen denies chest pain or shortness of breath. Patient will be tested for Covid today. Cassie Petersen reported that after appendectomy she was told that she had some breathing issue, might have been given too much medication. Surgery was done at [facility], [facility]- I requested records. ( surgery 2014) I received a fax that said they could not find patient with current name or maiden name, I would have to send entire SS number, I declined.

## 2019-11-17 ENCOUNTER — Encounter (HOSPITAL_COMMUNITY): Payer: Self-pay | Admitting: Obstetrics & Gynecology

## 2019-11-17 ENCOUNTER — Ambulatory Visit (HOSPITAL_COMMUNITY): Payer: Managed Care, Other (non HMO)

## 2019-11-17 ENCOUNTER — Ambulatory Visit (HOSPITAL_COMMUNITY)
Admission: RE | Admit: 2019-11-17 | Discharge: 2019-11-17 | Disposition: A | Discharge status: Home / Self Care | Payer: Managed Care, Other (non HMO) | Attending: Obstetrics & Gynecology | Admitting: Obstetrics & Gynecology

## 2019-11-17 ENCOUNTER — Encounter (HOSPITAL_COMMUNITY)
Admission: RE | Disposition: A | Discharge status: Home / Self Care | Payer: Self-pay | Source: Home / Self Care | Attending: Obstetrics & Gynecology

## 2019-11-17 ENCOUNTER — Ambulatory Visit (HOSPITAL_COMMUNITY): Payer: Managed Care, Other (non HMO) | Admitting: Certified Registered"

## 2019-11-17 DIAGNOSIS — Z3A01 Less than 8 weeks gestation of pregnancy: Secondary | ICD-10-CM | POA: Diagnosis not present

## 2019-11-17 DIAGNOSIS — Q513 Bicornate uterus: Secondary | ICD-10-CM | POA: Insufficient documentation

## 2019-11-17 DIAGNOSIS — O021 Missed abortion: Secondary | ICD-10-CM | POA: Insufficient documentation

## 2019-11-17 DIAGNOSIS — Z820 Family history of epilepsy and other diseases of the nervous system: Secondary | ICD-10-CM | POA: Insufficient documentation

## 2019-11-17 DIAGNOSIS — Z803 Family history of malignant neoplasm of breast: Secondary | ICD-10-CM | POA: Diagnosis not present

## 2019-11-17 DIAGNOSIS — R102 Pelvic and perineal pain: Secondary | ICD-10-CM

## 2019-11-17 HISTORY — PX: DILATION AND EVACUATION: SHX1459

## 2019-11-17 HISTORY — DX: Nausea with vomiting, unspecified: R11.2

## 2019-11-17 HISTORY — DX: Other specified postprocedural states: Z98.890

## 2019-11-17 LAB — CBC
HCT: 40.4 % (ref 36.0–46.0)
Hemoglobin: 13.6 g/dL (ref 12.0–15.0)
MCH: 30.8 pg (ref 26.0–34.0)
MCHC: 33.7 g/dL (ref 30.0–36.0)
MCV: 91.6 fL (ref 80.0–100.0)
Platelets: 248 10*3/uL (ref 150–400)
RBC: 4.41 MIL/uL (ref 3.87–5.11)
RDW: 12 % (ref 11.5–15.5)
WBC: 8.3 10*3/uL (ref 4.0–10.5)
nRBC: 0 % (ref 0.0–0.2)

## 2019-11-17 SURGERY — DILATION AND EVACUATION, UTERUS
Anesthesia: General | Site: Uterus

## 2019-11-17 MED ORDER — FENTANYL CITRATE (PF) 100 MCG/2ML IJ SOLN
INTRAMUSCULAR | Status: DC | PRN
  Administered 2019-11-17 (×2): 50 ug via INTRAVENOUS

## 2019-11-17 MED ORDER — FENTANYL CITRATE (PF) 250 MCG/5ML IJ SOLN
INTRAMUSCULAR | Status: AC
  Filled 2019-11-17: qty 5

## 2019-11-17 MED ORDER — ONDANSETRON HCL 4 MG/2ML IJ SOLN
INTRAMUSCULAR | Status: AC
  Filled 2019-11-17: qty 2

## 2019-11-17 MED ORDER — DEXMEDETOMIDINE HCL 200 MCG/2ML IV SOLN
INTRAVENOUS | Status: DC | PRN
  Administered 2019-11-17: 8 ug via INTRAVENOUS

## 2019-11-17 MED ORDER — ORAL CARE MOUTH RINSE: 15.0000 mL | Freq: Once | OROMUCOSAL | Status: AC

## 2019-11-17 MED ORDER — MIDAZOLAM HCL 2 MG/2ML IJ SOLN
INTRAMUSCULAR | Status: AC
  Filled 2019-11-17: qty 2

## 2019-11-17 MED ORDER — LACTATED RINGERS IV SOLN: INTRAVENOUS | Status: DC

## 2019-11-17 MED ORDER — DEXAMETHASONE SODIUM PHOSPHATE 10 MG/ML IJ SOLN
INTRAMUSCULAR | Status: AC
  Filled 2019-11-17: qty 1

## 2019-11-17 MED ORDER — SCOPOLAMINE 1 MG/3DAYS TD PT72
MEDICATED_PATCH | TRANSDERMAL | Status: AC
  Filled 2019-11-17: qty 1

## 2019-11-17 MED ORDER — ACETAMINOPHEN 500 MG PO TABS
1000.0000 mg | ORAL_TABLET | ORAL | Status: AC
  Administered 2019-11-17: 1000 mg via ORAL
  Filled 2019-11-17: qty 2

## 2019-11-17 MED ORDER — BUPIVACAINE HCL 0.5 % IJ SOLN
INTRAMUSCULAR | Status: DC | PRN
  Administered 2019-11-17: 30 mL

## 2019-11-17 MED ORDER — PROPOFOL 10 MG/ML IV BOLUS
INTRAVENOUS | Status: DC | PRN
  Administered 2019-11-17: 200 mg via INTRAVENOUS

## 2019-11-17 MED ORDER — DEXAMETHASONE SODIUM PHOSPHATE 10 MG/ML IJ SOLN
INTRAMUSCULAR | Status: DC | PRN
  Administered 2019-11-17: 5 mg via INTRAVENOUS

## 2019-11-17 MED ORDER — KETOROLAC TROMETHAMINE 30 MG/ML IJ SOLN
INTRAMUSCULAR | Status: DC | PRN
  Administered 2019-11-17: 30 mg via INTRAVENOUS

## 2019-11-17 MED ORDER — IBUPROFEN 600 MG PO TABS
600.0000 mg | ORAL_TABLET | Freq: Four times a day (QID) | ORAL | 2 refills | Status: DC | PRN
Start: 2019-11-17 — End: 2020-01-11

## 2019-11-17 MED ORDER — PROPOFOL 10 MG/ML IV BOLUS
INTRAVENOUS | Status: AC
  Filled 2019-11-17: qty 40

## 2019-11-17 MED ORDER — KETOROLAC TROMETHAMINE 30 MG/ML IJ SOLN
INTRAMUSCULAR | Status: AC
  Filled 2019-11-17: qty 1

## 2019-11-17 MED ORDER — DOXYCYCLINE HYCLATE 100 MG IV SOLR
200.0000 mg | Freq: Once | INTRAVENOUS | Status: AC
  Administered 2019-11-17: 200 mg via INTRAVENOUS
  Filled 2019-11-17: qty 200

## 2019-11-17 MED ORDER — OXYCODONE-ACETAMINOPHEN 5-325 MG PO TABS: 1.0000 | ORAL_TABLET | ORAL | 0 refills | Status: DC | PRN

## 2019-11-17 MED ORDER — BUPIVACAINE HCL 0.5 % IJ SOLN
INTRAMUSCULAR | Status: AC
  Filled 2019-11-17: qty 1

## 2019-11-17 MED ORDER — SCOPOLAMINE 1 MG/3DAYS TD PT72
MEDICATED_PATCH | TRANSDERMAL | Status: DC | PRN
  Administered 2019-11-17: 1 via TRANSDERMAL

## 2019-11-17 MED ORDER — CHLORHEXIDINE GLUCONATE 0.12 % MT SOLN
15.0000 mL | Freq: Once | OROMUCOSAL | Status: AC
  Administered 2019-11-17: 15 mL via OROMUCOSAL
  Filled 2019-11-17: qty 15

## 2019-11-17 MED ORDER — DOCUSATE SODIUM 100 MG PO CAPS
100.0000 mg | ORAL_CAPSULE | Freq: Two times a day (BID) | ORAL | 2 refills | Status: DC | PRN
Start: 2019-11-17 — End: 2020-01-11

## 2019-11-17 MED ORDER — MIDAZOLAM HCL 2 MG/2ML IJ SOLN
INTRAMUSCULAR | Status: DC | PRN
  Administered 2019-11-17: 2 mg via INTRAVENOUS

## 2019-11-17 MED ORDER — ONDANSETRON HCL 4 MG/2ML IJ SOLN
INTRAMUSCULAR | Status: DC | PRN
  Administered 2019-11-17: 4 mg via INTRAVENOUS

## 2019-11-17 SURGICAL SUPPLY — 22 items
CATH ROBINSON RED A/P 16FR (CATHETERS) ×2 IMPLANT
DECANTER SPIKE VIAL GLASS SM (MISCELLANEOUS) ×2 IMPLANT
FILTER UTR ASPR ASSEMBLY (MISCELLANEOUS) ×2 IMPLANT
GLOVE BIOGEL PI IND STRL 7.0 (GLOVE) ×1 IMPLANT
GLOVE BIOGEL PI INDICATOR 7.0 (GLOVE) ×1
GLOVE ECLIPSE 7.0 STRL STRAW (GLOVE) ×2 IMPLANT
GOWN STRL REUS W/ TWL LRG LVL3 (GOWN DISPOSABLE) ×2 IMPLANT
GOWN STRL REUS W/TWL LRG LVL3 (GOWN DISPOSABLE) ×2
HOSE CONNECTING 18IN BERKELEY (TUBING) ×2 IMPLANT
KIT BERKELEY 1ST TRI 3/8 NO TR (MISCELLANEOUS) ×2 IMPLANT
KIT BERKELEY 1ST TRIMESTER 3/8 (MISCELLANEOUS) ×2 IMPLANT
NS IRRIG 1000ML POUR BTL (IV SOLUTION) ×2 IMPLANT
PACK VAGINAL MINOR WOMEN LF (CUSTOM PROCEDURE TRAY) ×2 IMPLANT
PAD OB MATERNITY 4.3X12.25 (PERSONAL CARE ITEMS) ×2 IMPLANT
SET BERKELEY SUCTION TUBING (SUCTIONS) ×2 IMPLANT
TOWEL GREEN STERILE FF (TOWEL DISPOSABLE) ×4 IMPLANT
UNDERPAD 30X36 HEAVY ABSORB (UNDERPADS AND DIAPERS) ×2 IMPLANT
VACURETTE 10 RIGID CVD (CANNULA) IMPLANT
VACURETTE 6 ASPIR F TIP BERK (CANNULA) IMPLANT
VACURETTE 7MM CVD STRL WRAP (CANNULA) ×2 IMPLANT
VACURETTE 8 RIGID CVD (CANNULA) IMPLANT
VACURETTE 9 RIGID CVD (CANNULA) IMPLANT

## 2019-11-17 NOTE — H&P (Signed)
Preoperative History and Physical  Cassie Petersen is a 34 y.o. G3P1011 here for surgical management of intrauterine fetal demise at seven weeks gestation in the setting of known bicornuate uterus.   No significant preoperative concerns.  Proposed surgery: Dilation and Evacuation under ultrasound guidance  Past Medical History:  Diagnosis Date  . Bicornuate uterus   . HPV in female   . PONV (postoperative nausea and vomiting)    Past Surgical History:  Procedure Laterality Date  . APPENDECTOMY    . BREAST ENHANCEMENT SURGERY    . BREAST SURGERY Bilateral   . MULTIPLE TOOTH EXTRACTIONS     OB History  Gravida Para Term Preterm AB Living  3 1 1  0 1 1  SAB TAB Ectopic Multiple Live Births  1 0 0   1    # Outcome Date GA Lbr Len/2nd Weight Sex Delivery Anes PTL Lv  3 Current           2 Term 01/13/18 [redacted]w[redacted]d 25:57 / 01:46 3776 g M Vag-Spont EPI  LIV     Birth Comments: moulding  1 SAB           Patient denies any other pertinent gynecologic issues.   No current facility-administered medications on file prior to encounter.   No current outpatient medications on file prior to encounter.   No Known Allergies  Social History:   reports that she has never smoked. She has never used smokeless tobacco. She reports previous alcohol use. She reports that she does not use drugs.  Family History  Problem Relation Age of Onset  . Vision loss Mother   . Breast cancer Maternal Grandmother 50  . Cancer Paternal Grandmother   . Healthy Father   . Healthy Son     Review of Systems: Pertinent items noted in HPI and remainder of comprehensive ROS otherwise negative.  PHYSICAL EXAM: Pulse 90, temperature 97.7 F (36.5 C), temperature source Tympanic, resp. rate 18, height 5\' 7"  (1.702 m), weight 56.7 kg, last menstrual period 08/29/2019, SpO2 100 %, currently breastfeeding. CONSTITUTIONAL: Well-developed, well-nourished female in no acute distress.  HENT:  Normocephalic, atraumatic,  External right and left ear normal. Oropharynx is clear and moist EYES: Conjunctivae and EOM are normal. Pupils are equal, round, and reactive to light. No scleral icterus.  NECK: Normal range of motion, supple, no masses SKIN: Skin is warm and dry. No rash noted. Not diaphoretic. No erythema. No pallor. NEUROLOGIC: Alert and oriented to person, place, and time. Normal reflexes, muscle tone coordination. No cranial nerve deficit noted. PSYCHIATRIC: Normal mood and affect. Normal behavior. Normal judgment and thought content. CARDIOVASCULAR: Normal heart rate noted, regular rhythm RESPIRATORY: Effort and breath sounds normal, no problems with respiration noted ABDOMEN: Soft, nontender, nondistended. PELVIC: Deferred MUSCULOSKELETAL: Normal range of motion. No edema and no tenderness. 2+ distal pulses.  Labs: O+ blood type Results for orders placed or performed during the hospital encounter of 11/17/19 (from the past 336 hour(s))  CBC   Collection Time: 11/17/19  7:15 AM  Result Value Ref Range   WBC 8.3 4.0 - 10.5 K/uL   RBC 4.41 3.87 - 5.11 MIL/uL   Hemoglobin 13.6 12.0 - 15.0 g/dL   HCT 11/19/19 36 - 46 %   MCV 91.6 80.0 - 100.0 fL   MCH 30.8 26.0 - 34.0 pg   MCHC 33.7 30.0 - 36.0 g/dL   RDW 11/19/19 95.2 - 84.1 %   Platelets 248 150 - 400 K/uL   nRBC  0.0 0.0 - 0.2 %  Results for orders placed or performed during the hospital encounter of 11/16/19 (from the past 336 hour(s))  SARS CORONAVIRUS 2 (TAT 6-24 HRS) Nasopharyngeal Nasopharyngeal Swab   Collection Time: 11/16/19 11:39 AM   Specimen: Nasopharyngeal Swab  Result Value Ref Range   SARS Coronavirus 2 NEGATIVE NEGATIVE  Results for orders placed or performed in visit on 11/15/19 (from the past 336 hour(s))  CBC   Collection Time: 11/15/19  8:55 AM  Result Value Ref Range   WBC 7.8 3.8 - 10.8 Thousand/uL   RBC 4.40 3.80 - 5.10 Million/uL   Hemoglobin 13.7 11.7 - 15.5 g/dL   HCT 41.6 35 - 45 %   MCV 94.5 80.0 - 100.0 fL   MCH  31.1 27.0 - 33.0 pg   MCHC 32.9 32.0 - 36.0 g/dL   RDW 12.2 11.0 - 15.0 %   Platelets 238 140 - 400 Thousand/uL   MPV 9.8 7.5 - 12.5 fL  Results for orders placed or performed during the hospital encounter of 11/08/19 (from the past 336 hour(s))  Pregnancy, urine POC   Collection Time: 11/08/19  8:58 AM  Result Value Ref Range   Preg Test, Ur POSITIVE (A) NEGATIVE  Urinalysis, Routine w reflex microscopic   Collection Time: 11/08/19  9:24 AM  Result Value Ref Range   Color, Urine YELLOW YELLOW   APPearance HAZY (A) CLEAR   Specific Gravity, Urine 1.018 1.005 - 1.030   pH 5.0 5.0 - 8.0   Glucose, UA NEGATIVE NEGATIVE mg/dL   Hgb urine dipstick LARGE (A) NEGATIVE   Bilirubin Urine NEGATIVE NEGATIVE   Ketones, ur NEGATIVE NEGATIVE mg/dL   Protein, ur 30 (A) NEGATIVE mg/dL   Nitrite NEGATIVE NEGATIVE   Leukocytes,Ua NEGATIVE NEGATIVE   RBC / HPF 0-5 0 - 5 RBC/hpf   WBC, UA 6-10 0 - 5 WBC/hpf   Bacteria, UA RARE (A) NONE SEEN   Squamous Epithelial / LPF 11-20 0 - 5   Mucus PRESENT     Imaging Studies: US OB Comp Less 14 Wks  Result Date: 11/15/2019 CLINICAL DATA:  34 year old pregnant female presents for assessment of fetal dating and viability. Quantitative beta HCG unavailable. EDC by LMP: 06/04/2020, projecting to an expected gestational age of [redacted] weeks 1 day. EXAM: OBSTETRIC <14 WK ULTRASOUND TECHNIQUE: Transabdominal ultrasound was performed for evaluation of the gestation as well as the maternal uterus and adnexal regions. COMPARISON:  11/08/2019 obstetric scan. FINDINGS: Intrauterine gestational sac: Single intrauterine gestational sac in the left uterine horn. Yolk sac:  Visualized. Embryo:  Not Visualized. Cardiac Activity: Not Visualized. MSD:  24.0 mm   7 w   2 d Subchorionic hemorrhage:  None visualized. Maternal uterus/adnexae: Anteverted uterus with two uterine horns without definite outer fundal uterine concavity, favoring septate uterus. No uterine fibroids. Right ovary  measures 3.8 x 2.2 x 1.7 cm. Left ovary measures 3.0 x 1.9 x 3.1 cm and contains a corpus luteum. No abnormal free fluid in the pelvis. IMPRESSION: 1. Single intrauterine gestational sac with yolk sac in the left uterine horn. No embryo detected. No substantial change in mean sac diameter since 11/08/2019 scan. Findings are suspicious but not yet definitive for nonviable pregnancy (definitive diagnosis requires absence of development of an embryo > 10 days after initial gestational sac detection). Recommend follow-up US in 7 days for definitive diagnosis. This recommendation follows SRU consensus guidelines: Diagnostic Criteria for Nonviable Pregnancy Early in the First Trimester. N Engl J Med  2013; 387:5643-32. 2. Suggestion of a developmental uterine anomaly, not definitively characterized on this scan, although favoring septate uterus. 3. No ovarian or adnexal abnormality. Electronically Signed   By: Delbert Phenix M.D.   On: 11/15/2019 09:45   US OB LESS THAN 14 WEEKS WITH OB TRANSVAGINAL  Result Date: 11/08/2019 CLINICAL DATA:  Pregnant, bleeding EXAM: OBSTETRIC <14 WK ULTRASOUND TECHNIQUE: Transabdominal ultrasound was performed for evaluation of the gestation as well as the maternal uterus and adnexal regions. COMPARISON:  None. FINDINGS: Intrauterine gestational sac: Single gestational sac identified in left horn of bicornuate uterus. Yolk sac:  Visualized. Embryo:  Not Visualized. Cardiac Activity: Not Visualized. Heart Rate: MSD:  23 mm   7 w   2 d Subchorionic hemorrhage:  None visualized. Maternal uterus/adnexae: Bicornuate uterus. Multiple small bilateral ovarian follicles. IMPRESSION: There is a single gestational sac identified in the left horn of a bicornuate uterus, with a yolk sac identified but without fetal pole or cardiac activity. Mean sac diameter 23 mm. Early pregnancy of uncertain viability; recommend follow-up ultrasound in 7 days to assess for continued development and viability.  Electronically Signed   By: Lauralyn Primes M.D.   On: 11/08/2019 10:35    Assessment: Patient Active Problem List   Diagnosis Date Noted  . Missed abortion at [redacted] weeks gestation 11/16/2019  . Bicornuate uterus     Plan: Patient will undergo surgical management with Dilation and Evacuation under ultrasound guidance.   Risks of surgery including bleeding, infection, injury to surrounding organs, need for additional procedures, possibility of intrauterine scarring which may impair future fertility, risk of retained products which may require further management and other postoperative/anesthesia complications were explained to patient.  Likelihood of success of complete evacuation of the uterus was discussed with the patient.  Written informed consent was obtained.  Patient has been NPO since last night  she will remain NPO for procedure. Anesthesia and OR aware.  Preoperative prophylactic Doxycycline 200mg  IV  has been ordered and is on call to the OR.  To OR when ready.     , MD, FACOG Obstetrician & Gynecologist, [facility] for [facility]

## 2019-11-17 NOTE — Anesthesia Procedure Notes (Signed)
Procedure Name: LMA Insertion Date/Time: 11/17/2019 8:43 AM Performed by: De Nurse, CRNA Pre-anesthesia Checklist: Patient identified, Emergency Drugs available, Suction available and Patient being monitored Patient Re-evaluated:Patient Re-evaluated prior to induction Oxygen Delivery Method: Circle System Utilized Preoxygenation: Pre-oxygenation with 100% oxygen Induction Type: IV induction LMA: LMA inserted LMA Size: 4.0 Number of attempts: 1 Placement Confirmation: positive ETCO2 Tube secured with: Tape Dental Injury: Teeth and Oropharynx as per pre-operative assessment  Comments: Placed by D. [place], [place]

## 2019-11-17 NOTE — Anesthesia Preprocedure Evaluation (Signed)
Anesthesia Evaluation  Patient identified by MRN, date of birth, ID band Patient awake    Reviewed: Allergy & Precautions, NPO status , Patient's Chart, lab work & pertinent test results  History of Anesthesia Complications (+) PONV  Airway Mallampati: II  TM Distance: >3 FB Neck ROM: Full    Dental  (+) Teeth Intact, Dental Advisory Given   Pulmonary neg pulmonary ROS,  11/16/2019 SARS coronavirus NEG   breath sounds clear to auscultation       Cardiovascular negative cardio ROS   Rhythm:Regular Rate:Normal     Neuro/Psych negative neurological ROS     GI/Hepatic negative GI ROS, Neg liver ROS,   Endo/Other  negative endocrine ROS  Renal/GU negative Renal ROS     Musculoskeletal   Abdominal   Peds  Hematology negative hematology ROS (+)   Anesthesia Other Findings   Reproductive/Obstetrics (+) Pregnancy (missed Ab)                             Anesthesia Physical Anesthesia Plan  ASA: I  Anesthesia Plan: General   Post-op Pain Management:    Induction: Intravenous  PONV Risk Score and Plan: 4 or greater and Scopolamine patch - Pre-op, Dexamethasone and Ondansetron  Airway Management Planned: LMA  Additional Equipment:   Intra-op Plan:   Post-operative Plan:   Informed Consent: I have reviewed the patients History and Physical, chart, labs and discussed the procedure including the risks, benefits and alternatives for the proposed anesthesia with the patient or authorized representative who has indicated his/her understanding and acceptance.     Dental advisory given  Plan Discussed with: CRNA and Surgeon  Anesthesia Plan Comments:         Anesthesia Quick Evaluation

## 2019-11-17 NOTE — Transfer of Care (Signed)
Immediate Anesthesia Transfer of Care Note  Patient: Cassie Petersen  Procedure(s) Performed: DILATATION AND EVACUATION (N/A Uterus)  Patient Location: PACU  Anesthesia Type:General  Level of Consciousness: drowsy and patient cooperative  Airway & Oxygen Therapy: Patient Spontanous Breathing and Patient connected to face mask oxygen  Post-op Assessment: Report given to RN  Post vital signs: Reviewed and stable  Last Vitals:  Vitals Value Taken Time  BP 94/67 11/17/19 0925  Temp    Pulse 71 11/17/19 0924  Resp 12 11/17/19 0924  SpO2 100 % 11/17/19 0924  Vitals shown include unvalidated device data.  Last Pain:  Vitals:   11/17/19 0708  TempSrc: Tympanic         Complications: No complications documented.

## 2019-11-17 NOTE — Op Note (Signed)
Cassie Petersen PROCEDURE DATE:  11/17/2019  PREOPERATIVE DIAGNOSES: Intrauterine fetal demise at seven weeks, bicornuate uterus with gestation in left uterine horn POSTOPERATIVE DIAGNOSES: The same PROCEDURE:  Dilation and Evacuation under ultrasound guidance SURGEON:  Dr. Jaynie Collins  INDICATIONS: 34 y.o.  [id number] with the aforementioned diagnoses who desires surgical management.  Risks of surgery were discussed with the patient including but not limited to: bleeding which may require transfusion; infection which may require antibiotics; injury to uterus or surrounding organs; need for additional procedures including laparotomy or laparoscopy; possibility of intrauterine scarring which may impair future fertility; and other postoperative/anesthesia complications. Written informed consent was obtained.  FINDINGS:    Intrauterine fetal demise at seven weeks, bicornuate uterus with gestation in left uterine horn. Adequate amount of products of conception.  Empty endometrial stripe in left uterine horn noted on ultrasound at the end of the procedure. Empty right uterine horn also noted.  ANESTHESIA:    General, paracervical block with 30 ml of 0.5% Marcaine ESTIMATED BLOOD LOSS:  25 ml. SPECIMENS:  Products of conception sent to pathology COMPLICATIONS:  None immediate.  PROCEDURE DETAILS:  The patient received intravenous Doxycycline while in the preoperative area.  She was then taken to the operating room where anesthesia  was administered and was found to be adequate.  After an adequate timeout was performed, she was placed in the dorsal lithotomy position and examined; then prepped and draped in the sterile manner.   Her bladder was catheterized for an unmeasured amount of clear, yellow urine. A vaginal speculum was then placed in the patient's vagina and a single tooth tenaculum was applied to the anterior lip of the cervix.  A paracervical block using 30 ml of 0.5% Marcaine was  administered. The cervix was gently dilated under ultrasound guidance to accommodate a 7 mm suction curette that was gently advanced to the uterine fundus in the uterine horn.  The suction device was then activated and curette slowly rotated to clear the uterus of products of conception.  There was an empty endometrial stripe noted on the ultrasound at the end of the curettage. There was minimal bleeding noted at the end of the procedure, and the tenaculum removed with good hemostasis noted.   All instruments were removed from the patient's vagina.  Sponge and instrument counts were correct times three.    The patient tolerated the procedure well and was taken to the recovery area awake, extubated and in stable condition.  The patient will be discharged to home as per PACU criteria.  Routine postoperative instructions given.  She was prescribed Percocet, Ibuprofen and Colace.  She will follow up in the office in 2-3 weeks for postoperative evaluation.   Jaynie Collins, MD, FACOG Obstetrician & Gynecologist, [facility] for [employer], [facility]

## 2019-11-17 NOTE — Anesthesia Postprocedure Evaluation (Addendum)
Anesthesia Post Note  Patient: Cassie Petersen  Procedure(s) Performed: DILATATION AND EVACUATION (N/A Uterus)     Patient location during evaluation: PACU Anesthesia Type: General Level of consciousness: awake and alert, oriented and patient cooperative Pain management: pain level controlled Vital Signs Assessment: post-procedure vital signs reviewed and stable Respiratory status: spontaneous breathing, nonlabored ventilation and respiratory function stable Cardiovascular status: blood pressure returned to baseline and stable Postop Assessment: no apparent nausea or vomiting Anesthetic complications: no   No complications documented.  Last Vitals:  Vitals:   11/17/19 1055 11/17/19 1100  BP: 102/72   Pulse: 82 85  Resp: 15 15  Temp: (!) 36.3 C   SpO2: 99% 99%    Last Pain:  Vitals:   11/17/19 1055  TempSrc:   PainSc: Asleep                 Anavi Branscum,E. Luisfelipe [place]elstad

## 2019-11-18 ENCOUNTER — Encounter (HOSPITAL_COMMUNITY): Payer: Self-pay | Admitting: Obstetrics & Gynecology

## 2019-11-18 LAB — SURGICAL PATHOLOGY

## 2019-11-22 ENCOUNTER — Ambulatory Visit: Payer: Managed Care, Other (non HMO) | Admitting: Obstetrics & Gynecology

## 2019-11-22 ENCOUNTER — Telehealth: Payer: Self-pay | Admitting: *Deleted

## 2019-11-22 NOTE — Telephone Encounter (Signed)
Pt called stating that her Rt forearm above the area of where her IV was placed was painful.  She stated that when she woke up from anesthesia she felt like her arm was broken.  She stated that the pain has gotten worse over the last couple of days.  She denies any swelling, redness or fever in the area.  Dr Penne Lash spoke with the anesthesiologist who suggest heat and Ibuprofen 400 mg every 4 hours for 48 hours.  She will call if no improvement by then for an appt with her PCP.

## 2019-11-29 ENCOUNTER — Other Ambulatory Visit: Payer: Self-pay

## 2019-11-29 ENCOUNTER — Other Ambulatory Visit (HOSPITAL_COMMUNITY)
Admission: RE | Admit: 2019-11-29 | Discharge: 2019-11-29 | Disposition: A | Discharge status: Home / Self Care | Payer: Managed Care, Other (non HMO) | Source: Ambulatory Visit | Attending: Obstetrics & Gynecology | Admitting: Obstetrics & Gynecology

## 2019-11-29 ENCOUNTER — Encounter: Payer: Self-pay | Admitting: Obstetrics & Gynecology

## 2019-11-29 ENCOUNTER — Ambulatory Visit (INDEPENDENT_AMBULATORY_CARE_PROVIDER_SITE_OTHER): Payer: Managed Care, Other (non HMO) | Admitting: Obstetrics & Gynecology

## 2019-11-29 VITALS — BP 108/72 | HR 89 | Resp 16 | Ht 68.0 in | Wt 127.0 lb

## 2019-11-29 DIAGNOSIS — M79601 Pain in right arm: Secondary | ICD-10-CM | POA: Diagnosis not present

## 2019-11-29 DIAGNOSIS — Z124 Encounter for screening for malignant neoplasm of cervix: Secondary | ICD-10-CM | POA: Diagnosis present

## 2019-11-29 DIAGNOSIS — N96 Recurrent pregnancy loss: Secondary | ICD-10-CM | POA: Diagnosis not present

## 2019-11-29 NOTE — Patient Instructions (Signed)
Colposcopy Colposcopy is a procedure to examine the lowest part of the uterus (cervix), the vagina, and the area around the vaginal opening (vulva) for abnormalities or signs of disease. The procedure is done using a lighted microscope or magnifying lens (colposcope). If any unusual cells are found during the procedure, your health care provider may remove a tissue sample for testing (biopsy). A colposcopy may be done if you:  Have an abnormal Pap test. A Pap test is a screening test that is used to check for signs of cancer or infection of the vagina, cervix, and uterus.  Have a Pap smear test in which you test positive for high-risk HPV (human papillomavirus).  Have a sore or lesion on your cervix.  Have genital warts on your vulva, vagina, or cervix.  Took certain medicines while pregnant, such as diethylstilbestrol (DES).  Have pain during sexual intercourse.  Have vaginal bleeding, especially after sexual intercourse.  Need to have a cervical polyp removed.  Need to have a lost intrauterine device (IUD) string located. Let your health care provider know about:  Any allergies you have, including allergies to prescribed medicine, latex, or iodine.  All medicines you are taking, including vitamins, herbs, eye drops, creams, and over-the-counter medicines. Bring a list of all of your medicines to your appointment.  Any problems you or family members have had with anesthetic medicines.  Any blood disorders you have.  Any surgeries you have had.  Any medical conditions you have, such as pelvic inflammatory disease (PID) or endometrial disorder.  Any history of frequent fainting.  Your menstrual cycle and what form of birth control (contraception) you use.  Your medical history, including any prior cervical treatment.  Whether you are pregnant or may be pregnant. What are the risks? Generally, this is a safe procedure. However, problems may occur,  including:  Pain.  Infection, which may include a fever, bad-smelling discharge, or pelvic pain.  Bleeding or discharge.  Misdiagnosis.  Fainting and vasovagal reactions, but this is rare.  Allergic reactions to medicines.  Damage to other structures or organs. What happens before the procedure?  If you have your menstrual period or will have it at the time of your procedure, tell your health care provider. A colposcopy typically is not done during menstruation.  Continue your contraceptive practices before and after the procedure.  For 24 hours before the colposcopy: ? Do not douche. ? Do not use tampons. ? Do not use medicines, creams, or suppositories in the vagina. ? Do not have sexual intercourse.  Ask your health care provider about: ? Changing or stopping your regular medicines. This is especially important if you are taking diabetes medicines or blood thinners. ? Taking medicines such as aspirin and ibuprofen. These medicines can thin your blood. Do not take these medicines before your procedure if your health care provider instructs you not to. It is likely that your health care provider will tell you to avoid taking aspirin or medicine that contains aspirin for 7 days before the procedure.  Follow instructions from your health care provider about eating or drinking restrictions. You will likely need to eat a regular diet the day of the procedure and not skip any meals.  You may have an exam or testing. A pregnancy test will be taken on the day of the procedure.  You may have a blood or urine sample taken.  Plan to have someone take you home from the hospital or clinic.  If you will be going   home right after the procedure, plan to have someone with you for 24 hours. What happens during the procedure?  You will lie down on your back, with your feet in foot rests (stirrups).  A warmed and lubricated instrument (speculum) will be inserted into your vagina. The  speculum will be used to hold apart the walls of your vagina so your health care provider can see your cervix and the inside of your vagina.  A cotton swab will be used to place a small amount of liquid solution on the areas to be examined. This solution makes it easier to see abnormal cells. You may feel a slight burning during this part.  The colposcope will be used to scan the cervix with a bright white light. The colposcope will be held near your vulvaand will magnify your vulva, vagina, and cervix for easier examination.  Your health care provider may decide to take a biopsy. If so: ? You may be given medicine to numb the area (local anesthetic). ? Surgical instruments will be used to suck out mucus and cells through your vagina. ? You may feel mild pain while the tissue sample is removed. ? Bleeding may occur. A solution may be used to stop the bleeding. ? If a sample of tissue is needed from the inside of the cervix, a different procedure called endocervical curettage (ECC) may be completed. During this procedure, a curved instrument (curette) will be used to scrape cells from your cervix or the top of your cervix (endocervix).  Your health care provider will record the location of any abnormalities. The procedure may vary among health care providers and hospitals. What happens after the procedure?  You will lie down and rest for a few minutes. You may be offered juice or cookies.  Your blood pressure, heart rate, breathing rate, and blood oxygen level will be monitored until any medicines you were given have worn off.  You may have to wear compression stockings. These stockings help to prevent blood clots and reduce swelling in your legs.  You may have some cramping in your abdomen. This should go away after a few minutes. This information is not intended to replace advice given to you by your health care provider. Make sure you discuss any questions you have with your health care  provider. Document Revised: 05/02/2017 Document Reviewed: 12/25/2015 Elsevier Patient Education  2020 [employer] Inc[employer]  

## 2019-11-30 LAB — TESTOSTERONE: Testosterone: 18 ng/dL (ref 8–60)

## 2019-11-30 LAB — CYTOLOGY - PAP
Comment: NEGATIVE
Diagnosis: HIGH — AB
High risk HPV: NEGATIVE

## 2019-11-30 LAB — HEMOGLOBIN A1C
Est. average glucose Bld gHb Est-mCnc: 103 mg/dL
Hgb A1c MFr Bld: 5.2 % (ref 4.8–5.6)

## 2019-11-30 LAB — TSH: TSH: 0.446 u[IU]/mL — ABNORMAL LOW (ref 0.450–4.500)

## 2019-12-02 ENCOUNTER — Encounter: Payer: Self-pay | Admitting: Obstetrics & Gynecology

## 2019-12-02 DIAGNOSIS — R87619 Unspecified abnormal cytological findings in specimens from cervix uteri: Secondary | ICD-10-CM | POA: Insufficient documentation

## 2019-12-02 NOTE — Progress Notes (Signed)
   Subjective:    Patient ID: Cassie Petersen, female    DOB: 24-Aug-1985, 34 y.o.   MRN: [id number]  HPI  Pt doing well after D &C,  Her right fore arm still hurts but improving.  Pt interested in recurrent miscarriage work up.  She is also in need of a pap smear.  She came to [place] pregnant and appears to have had an abnormal pap with +HPV per the records and patient report.  I do not have the official report but can see some chart documentation in visits..    Review of Systems  Constitutional: Negative.   Respiratory: Negative.   Cardiovascular: Negative.   Gastrointestinal: Negative.   Genitourinary: Negative.   Musculoskeletal:       Right forearm pain after IV (developed post procedure)       Objective:   Physical Exam Vitals reviewed.  Constitutional:      General: She is not in acute distress.    Appearance: She is well-developed.  HENT:     Head: Normocephalic and atraumatic.  Eyes:     Conjunctiva/sclera: Conjunctivae normal.  Cardiovascular:     Rate and Rhythm: Normal rate.  Pulmonary:     Effort: Pulmonary effort is normal.  Abdominal:     General: Abdomen is flat.     Palpations: Abdomen is soft.  Musculoskeletal:     Right upper arm: Tenderness present. No swelling, edema, deformity or lacerations.     Left upper arm: Normal.       Arms:  Skin:    General: Skin is warm and dry.  Neurological:     Mental Status: She is alert and oriented to person, place, and time.       Assessment & Plan:  34 yo female with two ealy SABs after NSVD 2 years ago.    1.  Check Hgb A1c, TSH, APLA, PA-1 2.  Right arm--continue ibuprofen and hot compresses 3.  Pap smear today.

## 2019-12-07 ENCOUNTER — Ambulatory Visit (INDEPENDENT_AMBULATORY_CARE_PROVIDER_SITE_OTHER): Payer: Managed Care, Other (non HMO) | Admitting: Physician Assistant

## 2019-12-07 ENCOUNTER — Other Ambulatory Visit: Payer: Self-pay

## 2019-12-07 ENCOUNTER — Encounter: Payer: Self-pay | Admitting: Physician Assistant

## 2019-12-07 VITALS — BP 112/70 | HR 91 | Temp 98.5°F | Ht 68.0 in | Wt 127.2 lb

## 2019-12-07 DIAGNOSIS — R591 Generalized enlarged lymph nodes: Secondary | ICD-10-CM | POA: Diagnosis not present

## 2019-12-07 DIAGNOSIS — I809 Phlebitis and thrombophlebitis of unspecified site: Secondary | ICD-10-CM | POA: Diagnosis not present

## 2019-12-07 NOTE — Patient Instructions (Signed)
It was great to see you!  Follow-up if you have further concerns regarding your lymphnode.  Take care,  Jarold Motto PA-C   Thrombophlebitis Thrombophlebitis is a condition in which a blood clot forms in a vein. This can happen in your arms or legs, or in the area between your neck and groin (torso). When this condition happens in a vein that is close to the surface of the body (superficial thrombophlebitis), it is usually not serious.However, when the condition happens in a vein that is deep inside the body (deep vein thrombosis, DVT), it can cause serious problems. What are the causes? This condition may be caused by:  Damage to a vein.  Inflammation of the veins.  A condition that causes blood to clot more easily.  Reduced blood flow through the veins. What increases the risk? The following factors may make you more likely to develop this condition:  Having a condition that makes blood thicker or more likely to clot.  Having an infection.  Having major surgery.  Experiencing a traumatic injury or a broken bone.  Having a catheter in a vein (central line).  Having a condition in which valves in the veins do not work properly, causing blood to collect (pool) in the veins (chronic venous insufficiency).  An inactive (sedentary) lifestyle.  Pregnancy or having recently given birth.  Cancer.  Older age, especially being 66 or older.  Obesity.  Smoking.  Taking medicines that contain estrogen, such as birth control pills.  Having varicose veins.  Using drugs that are injected into the veins (intravenous, IV). What are the signs or symptoms? The main symptoms of this condition are:  Swelling and pain in an arm or leg. If the affected vein is in the leg, you may feel pain while standing or walking.  Warmth or redness in an arm or leg. Other symptoms include:  Low-grade fever.  Muscle aches.  A bulging vein (venous distension). In some cases, there are  no symptoms. How is this diagnosed? This condition may be diagnosed based on:  Your symptoms and medical history.  A physical exam.  Tests, such as: ? Blood tests. ? A test that uses sound waves to make images (ultrasound). How is this treated? Treatment depends on how severe the condition is and which area of the body is affected. Treatment may include:  Applying a warm compress or heating pad to affected areas.  Wearing compression stockings to help prevent blood clots and reduce swelling in your legs.  Raising (elevating) the affected arm or leg above the level of your heart.  Medicines, such as: ? Anti-inflammatory medicines, such as ibuprofen. ? Blood thinners (anticoagulants), such as heparin. ? Antibiotic medicine, if you have an infection.  Removing an IV that may be causing the problem. In rare cases, surgery may be needed to:  Remove a damaged section of a vein.  Place a filter in a large vein to catch blood clots before they reach the lungs. Follow these instructions at home: Medicines  Take over-the-counter and prescription medicines only as told by your health care provider.  If you were prescribed an antibiotic, take it as told by your health care provider. Do not stop using the antibiotic even if you feel better. Managing pain, stiffness, and swelling   If directed, put heat on the affected area as often as told by your health care provider. Use the heat source that your health care provider recommends, such as a moist heat pack or a  heating pad. ? Place a towel between your skin and the heat source. ? Leave the heat on for 20-30 minutes. ? Remove the heat if your skin turns bright red. This is especially important if you are not able to feel pain, heat, or cold. You may have a greater risk of getting burned.  Elevate the affected area above the level of your heart while you are sitting or lying down. Activity  Return to your normal activities as told by  your health care provider. Ask your health care provider what activities are safe for you.  Avoid sitting or lying down for long periods. If possible, stand up and walk around regularly. If you are taking blood thinners:  Take your medicine exactly as told, at the same time every day.  Avoid activities that could cause injury or bruising, and follow instructions about how to prevent falls.  Wear a medical alert bracelet or carry a card that lists what medicines you take. General instructions  Drink enough fluid to keep your urine pale yellow.  Wear compression stockings as told by your health care provider.  Do not use any products that contain nicotine or tobacco, such as cigarettes and e-cigarettes. If you need help quitting, ask your health care provider.  Keep all follow-up visits as told by your health care provider. This is important. Contact a health care provider if:  You miss a dose of your blood thinner, if applicable.  Your symptoms do not improve.  You have unusual bruising.  You have nausea, vomiting, or diarrhea that lasts for more than one day. Get help right away if:  You have any of these problems: ? New or worse pain, swelling, or redness in an arm or leg. ? Numbness or tingling in an arm or leg. ? Shortness of breath. ? Chest pain. ? Severe pain in your abdomen. ? Fast breathing. ? A fast or irregular heartbeat. ? Blood in your vomit, stool, or urine. ? A severe headache or confusion. ? A cut that does not stop bleeding.  You feel light-headed or dizzy.  You cough up blood.  You have a serious fall or accident, or you hit your head. These symptoms may represent a serious problem that is an emergency. Do not wait to see if the symptoms will go away. Get medical help right away. Call your local emergency services (911 in the [place] Do not drive yourself to the hospital. Summary  Thrombophlebitis is a condition in which a blood clot forms in a vein.  This can happen in a vein close to the surface of the body or a vein deep inside the body.  This condition can cause serious problems when it happens in a vein deep inside the body (deep vein thrombosis, DVT).  The main symptom of this condition is swelling and pain around the affected vein.  Treatment may include warm compresses, anti-inflammatory medicines, or blood thinners. This information is not intended to replace advice given to you by your health care provider. Make sure you discuss any questions you have with your health care provider. Document Revised: 09/09/2018 Document Reviewed: 11/13/2016 Elsevier Patient Education  2020 [employer].

## 2019-12-07 NOTE — Progress Notes (Signed)
Cassie Petersen is a 34 y.o. female here for a new problem.  I acted as a Neurosurgeon for [name], PA-C [employer], [place]   History of Present Illness:   Chief Complaint  Patient presents with  . Lump on right forearm    HPI   Lump on arm (vein) Patient had an IV placed in her right forearm on 11/17/2019. She had severe pain in this area after waking up in the hospital. The area has become hardened. She initially called her ob-gyn and they were going to pursue dopplers but then she called anesthesiology and they stated that to do "round the clock ibuprofen and warm compresses" which she did do. Area is tender and went away some. She has new areas of swelling which has made her concerned. Denies any pain, swelling, tenderness in her upper arm.  Lymph node Has a L posterior cervical lymph node that started about 1 week ago. She states that when she first noticed the area it was significantly tender. Area has decreased in pain and size since she first noticed it. Denies any recent URI symptoms.    Past Medical History:  Diagnosis Date  . Bicornuate uterus   . HPV in female   . PONV (postoperative nausea and vomiting)      Social History   Tobacco Use  . Smoking status: Never Smoker  . Smokeless tobacco: Never Used  Substance Use Topics  . Alcohol use: Not Currently    Comment: social  . Drug use: Never    Past Surgical History:  Procedure Laterality Date  . APPENDECTOMY    . BREAST ENHANCEMENT SURGERY    . BREAST SURGERY Bilateral   . DILATION AND EVACUATION N/A 11/17/2019   Procedure: DILATATION AND EVACUATION;  Surgeon: Tereso Newcomer, MD;  Location: [facility] OR;  Service: Gynecology;  Laterality: N/A;  . MULTIPLE TOOTH EXTRACTIONS      Family History  Problem Relation Age of Onset  . Vision loss Mother   . Breast cancer Maternal Grandmother 50  . Cancer Paternal Grandmother   . Healthy Father   . Healthy Son     No Known Allergies  Current Medications:    Current Outpatient Medications:  .  ibuprofen (ADVIL) 600 MG tablet, Take 1 tablet (600 mg total) by mouth every 6 (six) hours as needed for headache, mild pain, moderate pain or cramping., Disp: 30 tablet, Rfl: 2 .  docusate sodium (COLACE) 100 MG capsule, Take 1 capsule (100 mg total) by mouth 2 (two) times daily as needed for mild constipation or moderate constipation. (Patient not taking: Reported on 12/07/2019), Disp: 30 capsule, Rfl: 2   Review of Systems:   ROS Negative unless otherwise specified per HPI.  Vitals:   Vitals:   12/07/19 1435  BP: 112/70  Pulse: 91  Temp: 98.5 F (36.9 C)  TempSrc: Temporal  SpO2: 97%  Weight: 127 lb 3.2 oz (57.7 kg)  Height: 5\' 8"  (1.727 m)     Body mass index is 19.34 kg/m.  Physical Exam:   Physical Exam Vitals and nursing note reviewed.  Constitutional:      General: She is not in acute distress.    Appearance: She is well-developed. She is not ill-appearing or toxic-appearing.  Cardiovascular:     Rate and Rhythm: Normal rate and regular rhythm.     Pulses: Normal pulses.     Heart sounds: Normal heart sounds, S1 normal and S2 normal.     Comments: No  LE edema Pulmonary:     Effort: Pulmonary effort is normal.     Breath sounds: Normal breath sounds.  Musculoskeletal:     Comments: Area of swelling to R anterior wrist and middle posterior forearm; TTP at these areas. No tenderness/swelling to more proximal area of R arm (brachial area.)  Lymphadenopathy:     Cervical: Cervical adenopathy present.     Left cervical: Posterior cervical adenopathy present.  Skin:    General: Skin is warm and dry.  Neurological:     Mental Status: She is alert.     GCS: GCS eye subscore is 4. GCS verbal subscore is 5. GCS motor subscore is 6.  Psychiatric:        Speech: Speech normal.        Behavior: Behavior normal. Behavior is cooperative.      Assessment and Plan:   [name] was seen today for lump on right forearm.  Diagnoses  and all orders for this visit:  Phlebitis Patient also seen by Dr. Jacquiline Doe. Discussed pathophysiology of phlebitis and superficial thrombophlebitis. No red flags on exam. Encouraged continued conservative treatment, including use of ACE bandage for compression, and NSAIDs. Worsening precautions advised, especially if area spreads to more proximal area of arm or other concerns.  Lymphadenopathy Suspect reactive LAD. Already improved based upon patient report. If persists >2 weeks, patient was advised to follow-up in office for further evaluation and management.  . Reviewed expectations re: course of current medical issues. . Discussed self-management of symptoms. . Outlined signs and symptoms indicating need for more acute intervention. . Patient verbalized understanding and all questions were answered. . See orders for this visit as documented in the electronic medical record. . Patient received an After-Visit Summary.  CMA or LPN served as scribe during this visit. History, Physical, and Plan performed by medical provider. The above documentation has been reviewed and is accurate and complete.  Jarold Motto, PA-C

## 2019-12-10 LAB — LUPUS ANTICOAGULANT
Dilute Viper Venom Time: 26.7 s (ref 0.0–47.0)
PTT Lupus Anticoagulant: 33.2 s (ref 0.0–51.9)
Thrombin Time: 18.8 s (ref 0.0–23.0)
dPT Confirm Ratio: 0.93 Ratio (ref 0.00–1.40)
dPT: 31.5 s (ref 0.0–55.0)

## 2019-12-10 LAB — BETA-2-GLYCOPROTEIN I ABS, IGG/M/A
Beta-2 Glyco 1 IgA: <9 GPI IgA units (ref 0–25)
Beta-2 Glyco 1 IgM: <9 GPI IgM units (ref 0–32)
Beta-2 Glyco I IgG: <9 GPI IgG units (ref 0–20)

## 2019-12-10 LAB — PLASMINOGEN ACTIVATOR INHIBITOR 1(PAI-1) 4G/5G POLYMORPHISM

## 2019-12-13 ENCOUNTER — Encounter: Payer: Managed Care, Other (non HMO) | Admitting: Plastic Surgery

## 2019-12-13 ENCOUNTER — Ambulatory Visit (INDEPENDENT_AMBULATORY_CARE_PROVIDER_SITE_OTHER): Payer: Self-pay | Admitting: Plastic Surgery

## 2019-12-13 ENCOUNTER — Other Ambulatory Visit: Payer: Self-pay

## 2019-12-13 ENCOUNTER — Encounter: Payer: Self-pay | Admitting: Plastic Surgery

## 2019-12-13 VITALS — BP 110/65 | HR 83 | Temp 99.3°F

## 2019-12-13 DIAGNOSIS — Z719 Counseling, unspecified: Secondary | ICD-10-CM

## 2019-12-13 NOTE — Progress Notes (Signed)
Botulinum Toxin Procedure Note  Procedure: Cosmetic botulinum toxin   Pre-operative Diagnosis: Dynamic rhytide  Post-operative Diagnosis: Same  Complications:  None  Brief history: The patient desires botulinum toxin injection of her forehead. I discussed with the patient this proposed procedure of botulinum toxin injections, which is customized depending on the particular needs of the patient. It is performed on facial rhytids as a temporary correction. The alternatives were discussed with the patient. The risks were addressed including bleeding, scarring, infection, damage to deeper structures, asymmetry, and chronic pain, which may occur infrequently after a procedure. The individual's choice to undergo a surgical procedure is based on the comparison of risks to potential benefits. Other risks include unsatisfactory results, brow ptosis, eyelid ptosis, allergic reaction, temporary paralysis, which should go away with time, bruising, blurring disturbances and delayed healing. Botulinum toxin injections do not arrest the aging process or produce permanent tightening of the eyelid.  Operative intervention maybe necessary to maintain the results of a blepharoplasty or botulinum toxin. The patient understands and wishes to proceed.  Procedure: The area was prepped with alcohol and dried with a clean gauze. Using a clean technique, the botulinum toxin was diluted with 1.25 cc of preservative-free normal saline which was slowly injected with an 18 gauge needle in a tuberculin syringes.  A 32 gauge needles were then used to inject the botulinum toxin. This mixture allow for an aliquot of 5 units per 0.1 cc in each injection site.    Subsequently the mixture was injected in the glabellar and forehead area with preservation of the temporal branch to the lateral eyebrow as well as into each lateral canthal area beginning from the lateral orbital rim medial to the zygomaticus major in 3 separate areas. A total  of 40 Units of botulinum toxin was used. The forehead and glabellar area was injected with care to inject intramuscular only while holding pressure on the supratrochlear vessels in each area during each injection on either side of the medial corrugators. The injection proceeded vertically superiorly to the medial 2/3 of the frontalis muscle and superior 2/3 of the lateral frontalis, again with preservation of the frontal branch.  No complications were noted. Light pressure was held for 5 minutes. She was instructed explicitly in post-operative care.  Botox LOT:  [id number] C4 EXP:  11/23

## 2019-12-15 ENCOUNTER — Ambulatory Visit: Payer: Managed Care, Other (non HMO) | Admitting: Physician Assistant

## 2019-12-16 ENCOUNTER — Other Ambulatory Visit: Payer: Self-pay

## 2019-12-16 ENCOUNTER — Encounter: Payer: Self-pay | Admitting: Obstetrics and Gynecology

## 2019-12-16 ENCOUNTER — Other Ambulatory Visit: Payer: Self-pay | Admitting: Obstetrics and Gynecology

## 2019-12-16 ENCOUNTER — Ambulatory Visit (INDEPENDENT_AMBULATORY_CARE_PROVIDER_SITE_OTHER): Payer: Managed Care, Other (non HMO) | Admitting: Obstetrics and Gynecology

## 2019-12-16 DIAGNOSIS — R87611 Atypical squamous cells cannot exclude high grade squamous intraepithelial lesion on cytologic smear of cervix (ASC-H): Secondary | ICD-10-CM | POA: Insufficient documentation

## 2019-12-16 DIAGNOSIS — Z3202 Encounter for pregnancy test, result negative: Secondary | ICD-10-CM | POA: Diagnosis not present

## 2019-12-16 DIAGNOSIS — N870 Mild cervical dysplasia: Secondary | ICD-10-CM

## 2019-12-16 LAB — POCT URINE PREGNANCY: Preg Test, Ur: POSITIVE — AB

## 2019-12-16 NOTE — Progress Notes (Signed)
Patient with [facility] on recent pap smear here for colposcopy. Patient with recent D&C on 11/17/19 due to missed abortion  Patient given informed consent, signed copy in the chart, time out was performed.  Placed in lithotomy position. Cervix viewed with speculum and colposcope after application of acetic acid.   Colposcopy adequate?  Yes  Acetowhite lesions? 7 o'clock Punctation? no Mosaicism?  no Abnormal vasculature?  no Biopsies? 7 o'clock ECC? yes  COMMENTS:  Patient was given post procedure instructions.  She will return in 2 weeks for results.  Catalina Antigua, MD

## 2019-12-17 ENCOUNTER — Other Ambulatory Visit: Payer: Self-pay | Admitting: Obstetrics & Gynecology

## 2019-12-17 DIAGNOSIS — I808 Phlebitis and thrombophlebitis of other sites: Secondary | ICD-10-CM

## 2019-12-17 NOTE — Progress Notes (Signed)
Recurrent superficial clots in arm.  Desires pregnancy.

## 2019-12-20 ENCOUNTER — Encounter: Payer: Managed Care, Other (non HMO) | Admitting: Obstetrics and Gynecology

## 2019-12-20 ENCOUNTER — Telehealth: Payer: Self-pay | Admitting: Oncology

## 2019-12-20 ENCOUNTER — Encounter: Payer: Self-pay | Admitting: *Deleted

## 2019-12-20 ENCOUNTER — Other Ambulatory Visit: Payer: Self-pay | Admitting: *Deleted

## 2019-12-20 DIAGNOSIS — N96 Recurrent pregnancy loss: Secondary | ICD-10-CM

## 2019-12-20 NOTE — Telephone Encounter (Signed)
Received a new pt referral from Dr. Penne Lash for superficial thrombophlebitis of upper extremity. Pt has been cld and scheduled to see Dr. Clelia Croft on 8/10 at 11am. Pt aware to arrive 15 minutes early.

## 2019-12-21 ENCOUNTER — Other Ambulatory Visit: Payer: Self-pay

## 2019-12-21 ENCOUNTER — Encounter: Payer: Self-pay | Admitting: *Deleted

## 2019-12-21 ENCOUNTER — Ambulatory Visit (INDEPENDENT_AMBULATORY_CARE_PROVIDER_SITE_OTHER): Payer: Self-pay | Admitting: Plastic Surgery

## 2019-12-21 ENCOUNTER — Encounter: Payer: Self-pay | Admitting: Plastic Surgery

## 2019-12-21 VITALS — BP 107/78 | HR 81 | Temp 98.0°F

## 2019-12-21 DIAGNOSIS — Z719 Counseling, unspecified: Secondary | ICD-10-CM

## 2019-12-21 NOTE — Progress Notes (Signed)
.  csbo Botulinum Toxin Procedure Note  Procedure: Cosmetic botulinum toxin   Pre-operative Diagnosis: Dynamic rhytides   Post-operative Diagnosis: Same  Complications:  None  Brief history: The patient desires botulinum toxin injection of her forehead. I discussed with the patient this proposed procedure of botulinum toxin injections, which is customized depending on the particular needs of the patient. It is performed on facial rhytids as a temporary correction. The alternatives were discussed with the patient. The risks were addressed including bleeding, scarring, infection, damage to deeper structures, asymmetry, and chronic pain, which may occur infrequently after a procedure. The individual's choice to undergo a surgical procedure is based on the comparison of risks to potential benefits. Other risks include unsatisfactory results, brow ptosis, eyelid ptosis, allergic reaction, temporary paralysis, which should go away with time, bruising, blurring disturbances and delayed healing. Botulinum toxin injections do not arrest the aging process or produce permanent tightening of the eyelid.  Operative intervention maybe necessary to maintain the results of a blepharoplasty or botulinum toxin. The patient understands and wishes to proceed.  Procedure: The area was prepped with alcohol and dried with a clean gauze. Using a clean technique, the botulinum toxin was diluted with 1.25 cc of preservative-free normal saline which was slowly injected with an 18 gauge needle in a tuberculin syringes.  A 32 gauge needles were then used to inject the botulinum toxin. This mixture allow for an aliquot of 5 units per 0.1 cc in each injection site.    Subsequently the mixture was injected in the glabellar area. A total of 6 Units of botulinum toxin was used.  She is very pleased with the way the rest of her face looks.  No complications were noted. Light pressure was held for 5 minutes. She was instructed explicitly  in post-operative care.  Botox LOT:  [id number] C4 EXP:  04/2022

## 2019-12-22 ENCOUNTER — Encounter: Payer: Self-pay | Admitting: *Deleted

## 2019-12-22 NOTE — Progress Notes (Signed)
Please inform patient of low grade on colposcopy. Plan is to repeat pap smear in 6 months  Thanks  [employer]

## 2019-12-23 LAB — T4, FREE: Free T4: 1.05 ng/dL (ref 0.82–1.77)

## 2019-12-23 LAB — CARDIOLIPIN ANTIBODIES, IGG, IGM, IGA
Anticardiolipin IgA: <9 APL U/mL (ref 0–11)
Anticardiolipin IgG: 10 GPL U/mL (ref 0–14)
Anticardiolipin IgM: 14 MPL U/mL — ABNORMAL HIGH (ref 0–12)

## 2019-12-23 LAB — T3, FREE: T3, Free: 2.9 pg/mL (ref 2.0–4.4)

## 2019-12-23 LAB — SPECIMEN STATUS REPORT

## 2020-01-11 ENCOUNTER — Other Ambulatory Visit: Payer: Self-pay

## 2020-01-11 ENCOUNTER — Inpatient Hospital Stay: Payer: Managed Care, Other (non HMO)

## 2020-01-11 ENCOUNTER — Inpatient Hospital Stay: Payer: Managed Care, Other (non HMO) | Attending: Oncology | Admitting: Oncology

## 2020-01-11 VITALS — BP 124/83 | HR 90 | Temp 98.2°F | Resp 18 | Ht 68.0 in | Wt 128.4 lb

## 2020-01-11 DIAGNOSIS — Z79899 Other long term (current) drug therapy: Secondary | ICD-10-CM

## 2020-01-11 DIAGNOSIS — I809 Phlebitis and thrombophlebitis of unspecified site: Secondary | ICD-10-CM | POA: Diagnosis present

## 2020-01-11 DIAGNOSIS — Z803 Family history of malignant neoplasm of breast: Secondary | ICD-10-CM

## 2020-01-11 NOTE — Addendum Note (Signed)
Addended by: Benjiman Core on: 01/11/2020 11:50 AM   Modules accepted: Orders

## 2020-01-11 NOTE — Progress Notes (Signed)
Reason for the request:    Thrombophlebitis   HPI: I was asked by Dr. Penne Lash to evaluate Cassie Petersen for recurrent thrombophlebitis.  She is 34 year old woman without any significant comorbid conditions and is status post dilation and evacuation of intrauterine fetal demise at 7 weeks completed in June 16 of 2021.  She is G3 P1011 and currently desiring pregnancy.  She had a hypercoagulable panel obtained on November 29, 2019 which showed no evidence of lupus anticoagulant, normal beta-2 glycoprotein and normal anticardiolipin antibody.  Her IgM is mildly elevated at 14 and overall equivocal.  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    She did develop a postoperative phlebitis in her right hand around the IV site that has persisted.  Despite topical treatment and nonsteroidal anti-inflammatory she still feels that superficial vein.  She reported that her original pregnancy was uncomplicated with any phlebitis or deep vein thrombosis.  She has been on birth control previously without any complications.  She denies any family history of thrombosis or pregnancy losses.  She does not report any headaches, blurry vision, syncope or seizures. Does not report any fevers, chills or sweats.  Does not report any cough, wheezing or hemoptysis.  Does not report any chest pain, palpitation, orthopnea or leg edema.  Does not report any nausea, vomiting or abdominal pain.  Does not report any constipation or diarrhea.  Does not report any skeletal complaints.    Does not report frequency, urgency or hematuria.  Does not report any skin rashes or lesions. Does not report any heat or cold intolerance.  Does not report any lymphadenopathy or petechiae.  Does not report any anxiety or depression.  Remaining review of systems is negative.    Past Medical History:  Diagnosis Date  . Bicornuate uterus   . HPV in female   . PONV (postoperative nausea and vomiting)    :  Past Surgical History:  Procedure Laterality Date  . APPENDECTOMY    . BREAST ENHANCEMENT SURGERY    . BREAST SURGERY Bilateral   . DILATION AND EVACUATION N/A 11/17/2019   Procedure: DILATATION AND EVACUATION;  Surgeon: Tereso Newcomer, MD;  Location: [facility] OR;  Service: Gynecology;  Laterality: N/A;  . MULTIPLE TOOTH EXTRACTIONS    :   Current Outpatient Medications:  .  docusate sodium (COLACE) 100 MG capsule, Take 1 capsule (100 mg total) by mouth 2 (two) times daily as needed for mild constipation or moderate constipation., Disp: 30 capsule, Rfl: 2 .  ibuprofen (ADVIL) 600 MG tablet, Take 1 tablet (600 mg total) by mouth every 6 (six) hours as needed for headache, mild pain, moderate pain or cramping., Disp: 30 tablet, Rfl: 2:  No Known Allergies:  Family History  Problem Relation Age of Onset  . Vision loss Mother   . Breast cancer Maternal Grandmother 50  . Cancer Paternal Grandmother   . Healthy Father   . Healthy Son   :  Social History   Socioeconomic History  . Marital status: Married    Spouse name: Not on file  . Number of children: 1  . Years of education: Not on file  . Highest education level: Not on file  Occupational History  . Not on file  Tobacco Use  . Smoking status: Never Smoker  . Smokeless tobacco: Never Used  Substance and Sexual Activity  . Alcohol use: Not Currently    Comment: social  . Drug use: Never  .  Sexual activity: Yes  Other Topics Concern  . Not on file  Social History Narrative   ** Merged History Encounter **       Social Determinants of Health   Financial Resource Strain:   . Difficulty of Paying Living Expenses:   Food Insecurity:   . Worried About Programme researcher, broadcasting/film/video in the Last Year:   . Barista in the Last Year:   Transportation Needs:   . Freight forwarder (Medical):   Marland Kitchen Lack of Transportation (Non-Medical):   Physical Activity:   . Days of Exercise per Week:   . Minutes of Exercise per  Session:   Stress:   . Feeling of Stress :   Social Connections:   . Frequency of Communication with Friends and Family:   . Frequency of Social Gatherings with Friends and Family:   . Attends Religious Services:   . Active Member of Clubs or Organizations:   . Attends Banker Meetings:   Marland Kitchen Marital Status:   Intimate Partner Violence:   . Fear of Current or Ex-Partner:   . Emotionally Abused:   Marland Kitchen Physically Abused:   . Sexually Abused:   :  Pertinent items are noted in HPI.  Exam: Blood pressure 124/83, pulse 90, temperature 98.2 F (36.8 C), temperature source Tympanic, resp. rate 18, height 5\' 8"  (1.727 m), weight 128 lb 6.4 oz (58.2 kg), SpO2 99 %, not currently breastfeeding.   General appearance: alert and cooperative appeared without distress. Head: atraumatic without any abnormalities. Eyes: conjunctivae/corneas clear. PERRL.  Sclera anicteric. Throat: lips, mucosa, and tongue normal; without oral thrush or ulcers. Resp: clear to auscultation bilaterally without rhonchi, wheezes or dullness to percussion. Cardio: regular rate and rhythm, S1, S2 normal, no murmur, click, rub or gallop GI: soft, non-tender; bowel sounds normal; no masses,  no organomegaly Skin: Skin color, texture, turgor normal.  Slightly more prominent at [facility] superficial vein noted on her right hand. Lymph nodes: Cervical, supraclavicular, and axillary nodes normal. Neurologic: Grossly normal without any motor, sensory or deep tendon reflexes. Musculoskeletal: Normal range of motion all extremities.    Assessment and Plan:   34 year old woman with:  1.  Superficial phlebitis associated with IV access noted in June 2021.  This was noted postoperatively after a peripheral IV inserted in her hands.  On examination today, see no evidence to suggest propagation of of superficial phlebitis rather than likely mechanical irritation from her vein.  Inherited and acquired thrombophilia  discussion was reviewed today given her history of pregnancy losses.  Her hypercoagulable panel was discussed.  She has an equivocal findings that were reviewed at this time not quite sure has any impact on her previous miscarriages.  From a management standpoint, I would like to complete her hypercoagulable work-up which is lacking a prothrombin gene mutation, factor V Leiden as well as protein S and protein C deficiency.  Polymorphisms associated with plasminogen activator inhibitor remains of unclear significance.  At this time, I do not recommend any anticoagulation and conservative treatment for her phlebitis.  2.  Pregnancy losses: first trimester miscarriages with unclear connection to inherited or acquired thrombophilia.  She had a successful pregnancy previously and had exposure to birth control without any thrombosis or issues with her first pregnancy.  I do not recommend any anticoagulation during her next pregnancy unless she has abnormalities noted on the remaining hypercoagulable panel which will be completed today.  3.  Follow-up: Will be determined pending her  work-up.  45  minutes were dedicated to this visit. The time was spent on reviewing laboratory data, discussing treatment options, discussing differential diagnosis and answering questions regarding future plan.    A copy of this consult has been forwarded to the requesting physician.

## 2020-01-12 LAB — PROTEIN C, TOTAL: Protein C, Total: 115 % (ref 60–150)

## 2020-01-12 LAB — PROTEIN S ACTIVITY: Protein S Activity: 103 % (ref 63–140)

## 2020-01-12 LAB — PROTEIN C ACTIVITY: Protein C Activity: 138 % (ref 73–180)

## 2020-01-12 LAB — PROTEIN S, TOTAL: Protein S Ag, Total: 73 % (ref 60–150)

## 2020-01-14 LAB — FACTOR 5 LEIDEN

## 2020-01-17 LAB — PROTHROMBIN GENE MUTATION

## 2020-02-22 ENCOUNTER — Encounter: Payer: Self-pay | Admitting: Physician Assistant

## 2020-02-22 ENCOUNTER — Other Ambulatory Visit: Payer: Self-pay | Admitting: Physician Assistant

## 2020-02-22 ENCOUNTER — Other Ambulatory Visit: Payer: Self-pay

## 2020-02-22 ENCOUNTER — Telehealth (INDEPENDENT_AMBULATORY_CARE_PROVIDER_SITE_OTHER): Payer: Managed Care, Other (non HMO) | Admitting: Physician Assistant

## 2020-02-22 VITALS — Ht 68.0 in | Wt 125.0 lb

## 2020-02-22 DIAGNOSIS — N644 Mastodynia: Secondary | ICD-10-CM

## 2020-02-22 DIAGNOSIS — R053 Chronic cough: Secondary | ICD-10-CM

## 2020-02-22 DIAGNOSIS — R05 Cough: Secondary | ICD-10-CM

## 2020-02-22 NOTE — Progress Notes (Signed)
Virtual Visit via Video   I connected with Cassie Petersen on 02/22/20 at 12:30 PM EDT by a video enabled telemedicine application and verified that I am speaking with the correct person using two identifiers. Location patient: Home Location provider: [facility], Office Persons participating in the virtual visit: Cassie Petersen, Cassie Petersen, Cassie Mull, LPN   I discussed the limitations of evaluation and management by telemedicine and the availability of in person appointments. The patient expressed understanding and agreed to proceed.  I acted as a Neurosurgeon for [name], [name], LPN   Subjective:   HPI:   Cough Pt c/o a persistent dry cough for 8-9 weeks. Denies fever, chills, headaches, recent URI or nasal congestion. Pt has not taken any medication for her symptoms. Also denies: tobacco use, unintentional weight loss, bloody/productive cough, night sweats.   She does report that she has been dealing with heartburn/reflux since her pregnancy. She would take tums during her pregnancy and this overall helped her symptoms for the most part.  Breast pain She reports that for the past two months or so she has been having R breast pain the the "3 o'clock" position, as well as upper R breast/chest wall pain and R axillary pain. Denies: breast skin changes, nipple discharge.  Has positive family hx of breast cancer in maternal grandmother   ROS: See pertinent positives and negatives per HPI.  Patient Active Problem List   Diagnosis Date Noted  . Pap smear of cervix with ASCUS, cannot exclude HGSIL 12/16/2019  . Abnormal Pap smear of cervix 12/02/2019  . Encounter for counseling 10/30/2018  . Bicornuate uterus     Social History   Tobacco Use  . Smoking status: Never Smoker  . Smokeless tobacco: Never Used  Substance Use Topics  . Alcohol use: Not Currently    Comment: social    Current Outpatient Medications:  .  Prenatal Vit-Fe  Fumarate-FA (PRENATAL MULTIVITAMIN) TABS tablet, Take 1 tablet by mouth daily at 12 noon., Disp: , Rfl:   No Known Allergies  Objective:   VITALS: Per patient if applicable, see vitals. GENERAL: Alert, appears well and in no acute distress. HEENT: Atraumatic, conjunctiva clear, no obvious abnormalities on inspection of external nose and ears. NECK: Normal movements of the head and neck. CARDIOPULMONARY: No increased WOB. Speaking in clear sentences. I:E ratio WNL.  MS: Moves all visible extremities without noticeable abnormality. PSYCH: Pleasant and cooperative, well-groomed. Speech normal rate and rhythm. Affect is appropriate. Insight and judgement are appropriate. Attention is focused, linear, and appropriate.  NEURO: CN grossly intact. Oriented as arrived to appointment on time with no prompting. Moves both UE equally.  SKIN: No obvious lesions, wounds, erythema, or cyanosis noted on face or hands.  Assessment and Plan:   Cassie Petersen was seen today for cough.  Diagnoses and all orders for this visit:  Breast pain, right Stat imaging order placed for further evaluation. -     MM Digital Diagnostic Unilat R; Future  Persistent cough Possible reflux. Recommended starting nexium or prilosec daily x 2 weeks to see if this makes a difference. Stop medication if she were to become pregnant. Update xray to r/o any other underlying etiology. No red flags on discussion. Further work-up based on results and clinical progression. -     DG Chest 2 View; Future   I discussed the assessment and treatment plan with the patient. The patient was provided an opportunity to ask questions and all were  answered. The patient agreed with the plan and demonstrated an understanding of the instructions.   The patient was advised to call back or seek an in-person evaluation if the symptoms worsen or if the condition fails to improve as anticipated.   CMA or LPN served as scribe during this visit. History,  Physical, and Plan performed by medical provider. The above documentation has been reviewed and is accurate and complete.  [place], [place] 02/22/2020

## 2020-02-22 NOTE — Addendum Note (Signed)
Addended by: Haynes Bast on: 02/22/2020 01:29 PM   Modules accepted: Orders

## 2020-02-23 ENCOUNTER — Ambulatory Visit (INDEPENDENT_AMBULATORY_CARE_PROVIDER_SITE_OTHER)
Admission: RE | Admit: 2020-02-23 | Discharge: 2020-02-23 | Disposition: A | Discharge status: Home / Self Care | Payer: Managed Care, Other (non HMO) | Source: Ambulatory Visit | Attending: Physician Assistant | Admitting: Physician Assistant

## 2020-02-23 DIAGNOSIS — R05 Cough: Secondary | ICD-10-CM | POA: Diagnosis not present

## 2020-02-23 DIAGNOSIS — R053 Chronic cough: Secondary | ICD-10-CM

## 2020-02-25 ENCOUNTER — Telehealth: Payer: Managed Care, Other (non HMO) | Admitting: Physician Assistant

## 2020-03-06 ENCOUNTER — Ambulatory Visit
Admission: RE | Admit: 2020-03-06 | Discharge: 2020-03-06 | Disposition: A | Discharge status: Home / Self Care | Payer: Managed Care, Other (non HMO) | Source: Ambulatory Visit | Attending: Physician Assistant | Admitting: Physician Assistant

## 2020-03-06 ENCOUNTER — Ambulatory Visit: Payer: Managed Care, Other (non HMO)

## 2020-03-06 ENCOUNTER — Other Ambulatory Visit: Payer: Self-pay

## 2020-03-06 DIAGNOSIS — N644 Mastodynia: Secondary | ICD-10-CM

## 2020-03-10 ENCOUNTER — Telehealth (INDEPENDENT_AMBULATORY_CARE_PROVIDER_SITE_OTHER): Payer: Managed Care, Other (non HMO) | Admitting: Physician Assistant

## 2020-03-10 ENCOUNTER — Encounter: Payer: Self-pay | Admitting: Physician Assistant

## 2020-03-10 ENCOUNTER — Other Ambulatory Visit: Payer: Self-pay

## 2020-03-10 VITALS — Ht 68.0 in | Wt 125.0 lb

## 2020-03-10 DIAGNOSIS — R053 Chronic cough: Secondary | ICD-10-CM

## 2020-03-10 MED ORDER — IPRATROPIUM BROMIDE 0.03 % NA SOLN: 2.0000 | Freq: Two times a day (BID) | NASAL | 2 refills | Status: DC

## 2020-03-10 NOTE — Progress Notes (Signed)
Virtual Visit via Video   I connected with Cassie Petersen on 03/10/20 at 12:30 PM EDT by a video enabled telemedicine application and verified that I am speaking with the correct person using two identifiers. Location patient: Home Location provider: [facility], Office Persons participating in the virtual visit: Cassie Petersen, [place] PA-C,Cassie Lennon Alstrom, LPN   I discussed the limitations of evaluation and management by telemedicine and the availability of in person appointments. The patient expressed understanding and agreed to proceed.  I acted as a Neurosurgeon for [name], [name], LPN  Subjective:   HPI:   Cough Pt c/o chronic cough x 10 weeks, occasionally coughs up yellow sputum. Denies fever of chills, headaches, recent URI or nasal congestion. Pt is fully vaccinated.   We did a video visit on 02/22/20 and obtained CXR which was normal. We also had her trial prilosec and zyrtec, she felt like this did not impact her symptoms at all.  She is concerned that she is having PND causing her cough.  ROS: See pertinent positives and negatives per HPI.  Patient Active Problem List   Diagnosis Date Noted  . Pap smear of cervix with ASCUS, cannot exclude HGSIL 12/16/2019  . Abnormal Pap smear of cervix 12/02/2019  . Encounter for counseling 10/30/2018  . Bicornuate uterus     Social History   Tobacco Use  . Smoking status: Never Smoker  . Smokeless tobacco: Never Used  Substance Use Topics  . Alcohol use: Not Currently    Comment: social    Current Outpatient Medications:  .  Prenatal Vit-Fe Fumarate-FA (PRENATAL MULTIVITAMIN) TABS tablet, Take 1 tablet by mouth daily at 12 noon., Disp: , Rfl:  .  ipratropium (ATROVENT) 0.03 % nasal spray, Place 2 sprays into both nostrils every 12 (twelve) hours., Disp: 30 mL, Rfl: 2  No Known Allergies  Objective:   VITALS: Per patient if applicable, see vitals. GENERAL: Alert, appears well and in no  acute distress. HEENT: Atraumatic, conjunctiva clear, no obvious abnormalities on inspection of external nose and ears. NECK: Normal movements of the head and neck. CARDIOPULMONARY: No increased WOB. Speaking in clear sentences. I:E ratio WNL.  MS: Moves all visible extremities without noticeable abnormality. PSYCH: Pleasant and cooperative, well-groomed. Speech normal rate and rhythm. Affect is appropriate. Insight and judgement are appropriate. Attention is focused, linear, and appropriate.  NEURO: CN grossly intact. Oriented as arrived to appointment on time with no prompting. Moves both UE equally.  SKIN: No obvious lesions, wounds, erythema, or cyanosis noted on face or hands.  Assessment and Plan:   Amoy was seen today for cough.  Diagnoses and all orders for this visit:  Persistent cough  Other orders -     ipratropium (ATROVENT) 0.03 % nasal spray; Place 2 sprays into both nostrils every 12 (twelve) hours.   Persistent. Will trial atrovent nasal spray to see if this helps with PND. Referral to [facility] for further evaluation and mgmt.  I discussed the assessment and treatment plan with the patient. The patient was provided an opportunity to ask questions and all were answered. The patient agreed with the plan and demonstrated an understanding of the instructions.   The patient was advised to call back or seek an in-person evaluation if the symptoms worsen or if the condition fails to improve as anticipated.   CMA or LPN served as scribe during this visit. History, Physical, and Plan performed by medical provider. The above documentation has been  reviewed and is accurate and complete.   [place], [place] 03/10/2020

## 2020-03-29 ENCOUNTER — Encounter: Payer: Self-pay | Admitting: Pulmonary Disease

## 2020-03-29 ENCOUNTER — Ambulatory Visit (INDEPENDENT_AMBULATORY_CARE_PROVIDER_SITE_OTHER): Payer: Managed Care, Other (non HMO) | Admitting: Pulmonary Disease

## 2020-03-29 ENCOUNTER — Other Ambulatory Visit: Payer: Self-pay

## 2020-03-29 VITALS — BP 120/72 | HR 80 | Temp 97.7°F | Ht 67.0 in | Wt 124.0 lb

## 2020-03-29 DIAGNOSIS — R053 Chronic cough: Secondary | ICD-10-CM

## 2020-03-29 NOTE — Patient Instructions (Addendum)
Chronic cough --Will arrange Pulmonary Function Test  Follow-up after PFTs to discuss results    Cough, Adult Coughing is a reflex that clears your throat and your airways (respiratory system). Coughing helps to heal and protect your lungs. It is normal to cough occasionally, but a cough that happens with other symptoms or lasts a long time may be a sign of a condition that needs treatment. An acute cough may only last 2-3 weeks, while a chronic cough may last 8 or more weeks. Coughing is commonly caused by:  Infection of the respiratory systemby viruses or bacteria.  Breathing in substances that irritate your lungs.  Allergies.  Asthma.  Mucus that runs down the back of your throat (postnasal drip).  Smoking.  Acid backing up from the stomach into the esophagus (gastroesophageal reflux).  Certain medicines.  Chronic lung problems.  Other medical conditions such as heart failure or a blood clot in the lung (pulmonary embolism). Follow these instructions at home: Medicines  Take over-the-counter and prescription medicines only as told by your health care provider.  Talk with your health care provider before you take a cough suppressant medicine. Lifestyle   Avoid cigarette smoke. Do not use any products that contain nicotine or tobacco, such as cigarettes, e-cigarettes, and chewing tobacco. If you need help quitting, ask your health care provider.  Drink enough fluid to keep your urine pale yellow.  Avoid caffeine.  Do not drink alcohol if your health care provider tells you not to drink. General instructions   Pay close attention to changes in your cough. Tell your health care provider about them.  Always cover your mouth when you cough.  Avoid things that make you cough, such as perfume, candles, cleaning products, or campfire or tobacco smoke.  If the air is dry, use a cool mist vaporizer or humidifier in your bedroom or your home to help loosen  secretions.  If your cough is worse at night, try to sleep in a semi-upright position.  Rest as needed.  Keep all follow-up visits as told by your health care provider. This is important. Contact a health care provider if you:  Have new symptoms.  Cough up pus.  Have a cough that does not get better after 2-3 weeks or gets worse.  Cannot control your cough with cough suppressant medicines and you are losing sleep.  Have pain that gets worse or pain that is not helped with medicine.  Have a fever.  Have unexplained weight loss.  Have night sweats. Get help right away if:  You cough up blood.  You have difficulty breathing.  Your heartbeat is very fast. These symptoms may represent a serious problem that is an emergency. Do not wait to see if the symptoms will go away. Get medical help right away. Call your local emergency services (911 in the [place] Do not drive yourself to the hospital. Summary  Coughing is a reflex that clears your throat and your airways. It is normal to cough occasionally, but a cough that happens with other symptoms or lasts a long time may be a sign of a condition that needs treatment.  Take over-the-counter and prescription medicines only as told by your health care provider.  Always cover your mouth when you cough.  Contact a health care provider if you have new symptoms or a cough that does not get better after 2-3 weeks or gets worse. This information is not intended to replace advice given to you by your health care  provider. Make sure you discuss any questions you have with your health care provider. Document Revised: 06/08/2018 Document Reviewed: 06/08/2018 Elsevier Patient Education  2020 [employer].

## 2020-03-29 NOTE — Progress Notes (Signed)
Subjective:   PATIENT ID: Cassie Petersen GENDER: female DOB: Jul 10, 1985, MRN: [id number]   HPI  Chief Complaint  Patient presents with  . Consult    c/o cough x several months     Reason for Visit: New consult for chronic cough  Cassie Petersen is a never smoker 34 year old female never smoker with no history of childhood asthma who presents for chronic cough.  She was seen by [facility] for chronic cough. Per 03/10/20 note by PA Bufford Buttner she has had cough. Prilosec and zyrtec have been tried and did not have an effect. Referred to Pulmonary for further evaluation.  In our office she reports sudden onset of a mostly dry, nonproductive cough that began 3-4 months ago. Occasionally cough is associated with minimal sputum. The quality and timing of the cough is unchanged during the day. Does not wake up to coughing. Denies any preceding respiratory infection or illness. She does state that in the past whenever she is sick, she will "hang on to a cough" longer than others. Reports associated post-nasal drainage. Denies shortness of breath and wheezing.Tried zyertec and atrovent x 2 weeks without improvement however she admits to nonadherence. Since her pregnancy two years ago she reports heartburn and belching. Denies chest pain.  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   Environmental exposures: Recent mold in the middle of the house. Denies any work exposures or hobbies involcing raw materials, irritants or chemicals  I have personally reviewed patient's past medical/family/social history, allergies, current medications.  Past Medical History:  Diagnosis Date  . Bicornuate uterus   . HPV in female   . PONV (postoperative nausea and vomiting)      Family History  Problem Relation Age of Onset  . Vision loss Mother   . Breast cancer Maternal Grandmother 50  . Cancer  Paternal Grandmother   . Healthy Father   . Healthy Son      Social History   Occupational History  . Not on file  Tobacco Use  . Smoking status: Never Smoker  . Smokeless tobacco: Never Used  Substance and Sexual Activity  . Alcohol use: Not Currently    Comment: social  . Drug use: Never  . Sexual activity: Yes    No Known Allergies   Outpatient Medications Prior to Visit  Medication Sig Dispense Refill  . ipratropium (ATROVENT) 0.03 % nasal spray Place 2 sprays into both nostrils every 12 (twelve) hours. 30 mL 2  . Prenatal Vit-Fe Fumarate-FA (PRENATAL MULTIVITAMIN) TABS tablet Take 1 tablet by mouth daily at 12 noon.     No facility-administered medications prior to visit.    Review of Systems  Constitutional: Negative for chills, diaphoresis, fever, malaise/fatigue and weight loss.  HENT: Negative for congestion, ear pain and sore throat.   Respiratory: Positive for cough and sputum production. Negative for hemoptysis, shortness of breath and wheezing.   Cardiovascular: Negative for chest pain, palpitations and leg swelling.  Gastrointestinal: Negative for abdominal pain, heartburn and nausea.  Genitourinary: Negative for frequency.  Musculoskeletal: Negative for joint pain and myalgias.  Skin: Negative for itching and rash.  Neurological: Negative for dizziness, weakness and headaches.  Endo/Heme/Allergies: Does not bruise/bleed easily.  Psychiatric/Behavioral: Negative for depression. The patient is not nervous/anxious.      Objective:   Vitals:   03/29/20 1625  BP: 120/72  Pulse: 80  Temp: 97.7 F (  36.5 C)  SpO2: 99%  Weight: 124 lb (56.2 kg)  Height: 5\' 7"  (1.702 m)   SpO2: 99 % O2 Device: None (Room air)  Physical Exam: General: Well-appearing, no acute distress HENT: [place], AT Eyes: EOMI, no scleral icterus Respiratory: Clear to auscultation bilaterally.  No crackles, wheezing or rales Cardiovascular: RRR, -M/R/G, no JVD GI: BS+, soft,  nontender Extremities:-Edema,-tenderness Neuro: AAO x4, CNII-XII grossly intact Skin: Intact, no rashes or bruising Psych: Normal mood, normal affect  Data Reviewed:  Imaging: CXR 02/23/20 - mild hyperinflation of lung fields, no infiltrate/effusion/edema  PFT: None on file  Labs: CBC    Component Value Date/Time   WBC 8.3 11/17/2019 0715   RBC 4.41 11/17/2019 0715   HGB 13.6 11/17/2019 0715   HCT 40.4 11/17/2019 0715   PLT 248 11/17/2019 0715   MCV 91.6 11/17/2019 0715   MCH 30.8 11/17/2019 0715   MCHC 33.7 11/17/2019 0715   RDW 12.0 11/17/2019 0715   Imaging, labs and test noted above have been reviewed independently by me.    Assessment & Plan:   Discussion: 34 year old female with no significant respiratory history who presents with chronic cough x 4 months. Multiple causes for cough are possible including uncontrolled asthma, acid reflux and post-nasal drainage being the highest on the differential. No medications on her current list to cause cough. Will order PFTs to rule out underlying obstructive or restrictive lung defect. We may not have optimized control of her reflux or nasal drainage so if PFTs are normal, would need to address this first. Will also consider chest imaging if cough persists or worsens. I addressed questions and concerns to patient's satisfaction.  Arrange for pulmonary function test   Health Maintenance Immunization History  Administered Date(s) Administered  . Influenza,inj,Quad PF,6+ Mos 02/12/2018  . PFIZER SARS-COV-2 Vaccination 09/25/2019, 10/16/2019  . Tdap 10/29/2017   CT Lung Screen - not indicated  Orders Placed This Encounter  Procedures  . Pulmonary function test    Standing Status:   Future    Standing Expiration Date:   03/29/2021    Scheduling Instructions:     Next available and before follow up appointment    Order Specific Question:   Where should this test be performed?    Answer:   [facility]    Order  Specific Question:   Full PFT: includes the following: basic spirometry, spirometry pre & post bronchodilator, diffusion capacity (DLCO), lung volumes    Answer:   Full PFT  No orders of the defined types were placed in this encounter.   Return for after PFTs.  I have spent a total time of 31-minutes on the day of the appointment reviewing prior documentation, coordinating care and discussing medical diagnosis and plan with the patient/family. Imaging, labs and tests included in this note have been reviewed and interpreted independently by me.  Lakeia Bradshaw 03/31/2021, MD [facility] 03/29/2020 4:47 PM  Office Number [phone]

## 2020-03-30 ENCOUNTER — Ambulatory Visit (INDEPENDENT_AMBULATORY_CARE_PROVIDER_SITE_OTHER): Payer: Managed Care, Other (non HMO) | Admitting: Pulmonary Disease

## 2020-03-30 ENCOUNTER — Encounter: Payer: Self-pay | Admitting: Pulmonary Disease

## 2020-03-30 VITALS — BP 120/70 | HR 109 | Temp 98.0°F | Ht 67.0 in | Wt 124.1 lb

## 2020-03-30 DIAGNOSIS — R053 Chronic cough: Secondary | ICD-10-CM

## 2020-03-30 LAB — PULMONARY FUNCTION TEST
DL/VA % pred: 91 %
DL/VA: 4.05 ml/min/mmHg/L
DLCO cor % pred: 105 %
DLCO cor: 25.98 ml/min/mmHg
DLCO unc % pred: 105 %
DLCO unc: 25.98 ml/min/mmHg
FEF 25-75 Post: 4.67 L/sec
FEF 25-75 Pre: 4.82 L/sec
FEF2575-%Change-Post: -3 %
FEF2575-%Pred-Post: 133 %
FEF2575-%Pred-Pre: 137 %
FEV1-%Change-Post: 0 %
FEV1-%Pred-Post: 120 %
FEV1-%Pred-Pre: 120 %
FEV1-Post: 4.11 L
FEV1-Pre: 4.12 L
FEV1FVC-%Change-Post: -4 %
FEV1FVC-%Pred-Pre: 103 %
FEV6-%Change-Post: 0 %
FEV6-%Pred-Post: 117 %
FEV6-%Pred-Pre: 117 %
FEV6-Post: 4.8 L
FEV6-Pre: 4.79 L
FEV6FVC-%Change-Post: 0 %
FEV6FVC-%Pred-Post: 101 %
FEV6FVC-%Pred-Pre: 101 %
FVC-%Change-Post: 4 %
FVC-%Pred-Post: 121 %
FVC-%Pred-Pre: 115 %
FVC-Post: 5.01 L
FVC-Pre: 4.79 L
Post FEV1/FVC ratio: 82 %
Post FEV6/FVC ratio: 100 %
Pre FEV1/FVC ratio: 86 %
Pre FEV6/FVC Ratio: 100 %
RV % pred: 107 %
RV: 1.74 L
TLC % pred: 122 %
TLC: 6.72 L

## 2020-03-30 NOTE — Progress Notes (Signed)
Subjective:   PATIENT ID: Cassie Petersen GENDER: female DOB: May 24, 1986, MRN: [id number]   HPI  Chief Complaint  Patient presents with   Follow-up    Chronic cough    Reason for Visit: PFT follow-up  Ms. Cassie Petersen is a 34 year old never smoker who presents for follow-up for chronic cough  On our last visit on 03/29/20, she reported sudden onset of a mostly dry, nonproductive cough that began 3-4 months ago. Occasionally cough is associated with minimal sputum. The quality and timing of the cough is unchanged during the day. Does not wake up to coughing. Denies any preceding respiratory infection or illness.   She presents to today after PFTs to review results. Symptom of chronic dry cough is unchanged. Cough seems improved at night when laying down. Will worsen in the mornings after being in the house. Reports she had tried atrovent x 10 days without impreovement. She does feel like her reflux uncontrolled. Sometimes she can taste gastric contents. Reports she overall eats very healthy. Does not eat large or late night meals. She states she avoids caffeine and chocolates but does drink tea regularly.  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   Environmental exposures: Recent mold in the middle of the house. Denies any work exposures or hobbies involcing raw materials, irritants or chemicals  I have personally reviewed patient's past medical/family/social history/allergies/current medications.  Past Medical History:  Diagnosis Date   Bicornuate uterus    HPV in female    PONV (postoperative nausea and vomiting)    No Known Allergies   Outpatient Medications Prior to Visit  Medication Sig Dispense Refill   ipratropium (ATROVENT) 0.03 % nasal spray Place 2 sprays into both nostrils every 12 (twelve) hours. 30 mL 2   Prenatal Vit-Fe Fumarate-FA (PRENATAL  MULTIVITAMIN) TABS tablet Take 1 tablet by mouth daily at 12 noon.     No facility-administered medications prior to visit.    Review of Systems  Constitutional: Negative for chills, diaphoresis, fever, malaise/fatigue and weight loss.  HENT: Negative for congestion.   Respiratory: Positive for cough and sputum production. Negative for hemoptysis, shortness of breath and wheezing.   Cardiovascular: Negative for chest pain, palpitations and leg swelling.     Objective:   Vitals:   03/30/20 1459  BP: 120/70  Pulse: (!) 109  Temp: 98 F (36.7 C)  TempSrc: Oral  SpO2: 97%  Weight: 124 lb 1.6 oz (56.3 kg)  Height: 5\' 7"  (1.702 m)      Physical Exam: General: Well-appearing, no acute distress HENT: [place], AT Eyes: EOMI, no scleral icterus Respiratory: Clear to auscultation bilaterally.  No crackles, wheezing or rales Cardiovascular: RRR, -M/R/G, no JVD Extremities:-Edema,-tenderness Neuro: AAO x4, CNII-XII grossly intact Skin: Intact, no rashes or bruising Psych: Normal mood, normal affect  Data Reviewed:  Imaging: CXR 02/23/20 - mild hyperinflation of lung fields, no infiltrate/effusion/edema  PFT: 03/30/20 FVC 5.01 (121%) FEV1 4.11 (120%) Ratio 86  TLC 122% DLCO 105% Interpretation: Normal spirometry and flow-volume curves  Labs: CBC    Component Value Date/Time   WBC 8.3 11/17/2019 0715   RBC 4.41 11/17/2019 0715   HGB 13.6 11/17/2019 0715   HCT 40.4 11/17/2019 0715   PLT 248 11/17/2019 0715   MCV 91.6 11/17/2019 0715   MCH 30.8 11/17/2019 0715   MCHC 33.7 11/17/2019 0715   RDW 12.0 11/17/2019 0715   Imaging, labs  and test noted above have been reviewed independently by me.    Assessment & Plan:   Discussion: 34 year old female with chronic cough x 4 months. We discussed common causes for cough including uncontrolled asthma, acid reflux and post-nasal drainage. Reviewed PFTs which showed normal spirometry and flow-volume curves. We discussed GERD diet  restrictions. We also discussed management of post-nasal drip. Offered PPI but will defer to H2 blocker per patient preference in the setting of family planning.  Chronic Cough  --START Flonase one spray per nare twice a day for symptoms  --Discuss with your OB/GYN regarding famotidine for reflux        Recommend 20 mg daily for two weeks, followed by 10 mg daily   If your cough continues for >2 months will need to consider GI consult to reflux evaluation/management. PCP can refer or I can if she wants to message me  Follow-up as needed   Health Maintenance Immunization History  Administered Date(s) Administered   Influenza,inj,Quad PF,6+ Mos 02/12/2018   PFIZER SARS-COV-2 Vaccination 09/25/2019, 10/16/2019   Tdap 10/29/2017   CT Lung Screen - not indicated  No orders of the defined types were placed in this encounter. No orders of the defined types were placed in this encounter.   Return if symptoms worsen or fail to improve.  I have spent a total time of 32-minutes on the day of the appointment reviewing prior documentation, coordinating care and discussing medical diagnosis and plan with the patient/family. Imaging, labs and tests included in this note have been reviewed and interpreted independently by me.  Cassie Espin Mechele Collin, MD [facility] 03/30/2020 2:51 PM  Office Number [phone]

## 2020-03-30 NOTE — Patient Instructions (Addendum)
Chronic Cough  --START Flonase one spray per nare twice a day for symptoms  --Discuss with your OB/GYN regarding famotidine for reflux        Recommend 20 mg daily for two weeks, followed by 10 mg daily   If your cough continues for >2 months will need to consider GI consult to reflux evaluation/management. PCP can refer.  Follow-up as needed   Food Choices for Gastroesophageal Reflux Disease, Adult When you have gastroesophageal reflux disease (GERD), the foods you eat and your eating habits are very important. Choosing the right foods can help ease your discomfort. Think about working with a nutrition specialist (dietitian) to help you make good choices. What are tips for following this plan?  Meals  Choose healthy foods that are low in fat, such as fruits, vegetables, whole grains, low-fat dairy products, and lean meat, fish, and poultry.  Eat small meals often instead of 3 large meals a day. Eat your meals slowly, and in a place where you are relaxed. Avoid bending over or lying down until 2-3 hours after eating.  Avoid eating meals 2-3 hours before bed.  Avoid drinking a lot of liquid with meals.  Cook foods using methods other than frying. Bake, grill, or broil food instead.  Avoid or limit: ? Chocolate. ? Peppermint or spearmint. ? Alcohol. ? Pepper. ? Black and decaffeinated coffee. ? Black and decaffeinated tea. ? Bubbly (carbonated) soft drinks. ? Caffeinated energy drinks and soft drinks.  Limit high-fat foods such as: ? Fatty meat or fried foods. ? Whole milk, cream, butter, or ice cream. ? Nuts and nut butters. ? Pastries, donuts, and sweets made with butter or shortening.  Avoid foods that cause symptoms. These foods may be different for everyone. Common foods that cause symptoms include: ? Tomatoes. ? Oranges, lemons, and limes. ? Peppers. ? Spicy food. ? Onions and garlic. ? Vinegar. Lifestyle  Maintain a healthy weight. Ask your doctor what weight  is healthy for you. If you need to lose weight, work with your doctor to do so safely.  Exercise for at least 30 minutes for 5 or more days each week, or as told by your doctor.  Wear loose-fitting clothes.  Do not smoke. If you need help quitting, ask your doctor.  Sleep with the head of your bed higher than your feet. Use a wedge under the mattress or blocks under the bed frame to raise the head of the bed. Summary  When you have gastroesophageal reflux disease (GERD), food and lifestyle choices are very important in easing your symptoms.  Eat small meals often instead of 3 large meals a day. Eat your meals slowly, and in a place where you are relaxed.  Limit high-fat foods such as fatty meat or fried foods.  Avoid bending over or lying down until 2-3 hours after eating.  Avoid peppermint and spearmint, caffeine, alcohol, and chocolate. This information is not intended to replace advice given to you by your health care provider. Make sure you discuss any questions you have with your health care provider. Document Revised: 09/10/2018 Document Reviewed: 06/25/2016 Elsevier Patient Education  2020 [employer].

## 2020-03-30 NOTE — Progress Notes (Signed)
Full PFT performed today. °

## 2020-04-03 ENCOUNTER — Telehealth: Payer: Self-pay | Admitting: *Deleted

## 2020-04-03 ENCOUNTER — Other Ambulatory Visit: Payer: Self-pay | Admitting: Obstetrics & Gynecology

## 2020-04-03 MED ORDER — FAMOTIDINE 10 MG PO TABS: ORAL_TABLET | ORAL | 0 refills | Status: DC

## 2020-04-03 MED ORDER — FAMOTIDINE 10 MG PO TABS: ORAL_TABLET | ORAL | 3 refills | Status: DC

## 2020-04-03 NOTE — Telephone Encounter (Signed)
Pt called stating that she has been seeing her PCP for several weeks for a persistant cough of 2 months.  She has had various workups that are all remarkable.  Her PCP wishes for her to start Pepcid [facility] but is referring her back to her OB/GYN because pt is wishing to be pregnant.  PCP is not comfortable with prescribing the med if she is attempting pregnancy.  Pepcid AC is cat B drug.  Discussed with Dr Penne Lash who has Ok'd the RX.  This was sent to [facility]

## 2020-04-03 NOTE — Progress Notes (Signed)
Famotidine safe in pregnancy.  Rx prescribed.

## 2020-04-05 ENCOUNTER — Telehealth: Payer: Self-pay | Admitting: Pulmonary Disease

## 2020-04-05 NOTE — Telephone Encounter (Signed)
Called and spoke with patient who states that her cough is either worse or the same since she was seen and she does not feel it is better. She stated that she was prescribed medication for gerd but is not sure that this is what it is. She states her cough does not happen when she is laying down and does not have many gerd symptoms. She is wondering if this could be LPR instead of gerd may need she needs a PCI instead. Also wanted to know if there was any other imaging that needs to be done. She states that her out of pocket max has been met and everything is covered right now and states if she waits till her follow up it will be the new year. She states she has lost weight over last 2 months could this be related?  Dr. Ellison please advise 

## 2020-04-08 NOTE — Telephone Encounter (Addendum)
Called and spoke with patient on 04/07/20. We rediscussed PPI which is the medication she expressed concerns about on our last clinic visit. Will continue with current plan with famotidine. If this remains ineffective, can change/add PPI and refer to GI. Her PCP can place referral or if she messages me, I can place GI referral.

## 2020-05-01 ENCOUNTER — Other Ambulatory Visit: Payer: Self-pay | Admitting: Obstetrics & Gynecology

## 2020-05-01 DIAGNOSIS — N96 Recurrent pregnancy loss: Secondary | ICD-10-CM

## 2020-05-01 DIAGNOSIS — N912 Amenorrhea, unspecified: Secondary | ICD-10-CM

## 2020-05-01 MED ORDER — HEPARIN SODIUM (PORCINE) 5000 UNIT/ML IJ SOLN: 5000.0000 [IU] | Freq: Two times a day (BID) | INTRAMUSCULAR | 3 refills | Status: DC

## 2020-05-01 NOTE — Progress Notes (Signed)
LMP 03/23/20.  Hx of 2 miscarriages.  Viability [place] ordered.

## 2020-05-01 NOTE — Progress Notes (Signed)
Cassie Petersen is early pregnant approximately 5 weeks and 3 days.  She has the plasminogen activator inhibitor 4G/5G polymorphism which can cause recurrent miscarriages and IUGR.  I spoke with Dr. April Manson who concurs with heparin in the first trimester 5000 units twice a day with transitioning to Lovenox in the second and third trimester.  I spoke with Mardella Layman by phone and she understands the diagnosis and reason for treatment.  She understands there is a small risk of bleeding and heparin-induced thrombocytopenia.  Patient will pick up medication today and start injections.  [place] scheduled.

## 2020-05-02 ENCOUNTER — Other Ambulatory Visit: Payer: Self-pay | Admitting: *Deleted

## 2020-05-02 MED ORDER — HEPARIN SODIUM (PORCINE) 5000 UNIT/ML IJ SOLN: 5000.0000 [IU] | Freq: Two times a day (BID) | INTRAMUSCULAR | 3 refills | Status: DC

## 2020-05-12 ENCOUNTER — Ambulatory Visit
Admission: RE | Admit: 2020-05-12 | Discharge: 2020-05-12 | Disposition: A | Discharge status: Home / Self Care | Payer: Managed Care, Other (non HMO) | Source: Ambulatory Visit | Attending: Obstetrics & Gynecology | Admitting: Obstetrics & Gynecology

## 2020-05-12 ENCOUNTER — Other Ambulatory Visit: Payer: Self-pay

## 2020-05-12 DIAGNOSIS — N912 Amenorrhea, unspecified: Secondary | ICD-10-CM

## 2020-05-23 ENCOUNTER — Other Ambulatory Visit: Payer: Self-pay | Admitting: Obstetrics & Gynecology

## 2020-06-03 NOTE — L&D Delivery Note (Addendum)
Delivery Note At 1405 a viable female infant was delivered via SVD, presentation: ROA with loose nuchal, reduced. APGAR: 9, 9; weight pending.   Placenta status: undelivered after 35 minutes of gentle cord traction and Pitocin therefore manual extraction was performed. Small fragment of placenta felt to be still adherent to fundus therefore Dr. Charlotta Newton was called to bedisde. Bleeding was minimal throughout.    Anesthesia: epidural and local Lacerations: bilateral labial, right repaired, left hemostatic. Small ~1cm 1st degree vaginal-hemostatic. Suture used for repair: 3-0 Vicryl rapide Est. Blood Loss (mL): 87 Placenta to path Complications retained placenta Cord ph n/a   Mom to postpartum. Baby to Couplet care / Skin to Skin.    Donette Larry, CNM 12/20/2020 3:11 PM

## 2020-06-07 ENCOUNTER — Encounter: Payer: Self-pay | Admitting: *Deleted

## 2020-06-12 ENCOUNTER — Other Ambulatory Visit (HOSPITAL_COMMUNITY)
Admission: RE | Admit: 2020-06-12 | Discharge: 2020-06-12 | Disposition: A | Discharge status: Home / Self Care | Payer: Managed Care, Other (non HMO) | Source: Ambulatory Visit | Attending: Obstetrics & Gynecology | Admitting: Obstetrics & Gynecology

## 2020-06-12 ENCOUNTER — Ambulatory Visit (INDEPENDENT_AMBULATORY_CARE_PROVIDER_SITE_OTHER): Payer: Managed Care, Other (non HMO) | Admitting: Obstetrics & Gynecology

## 2020-06-12 ENCOUNTER — Other Ambulatory Visit: Payer: Self-pay

## 2020-06-12 ENCOUNTER — Encounter: Payer: Self-pay | Admitting: Obstetrics & Gynecology

## 2020-06-12 ENCOUNTER — Ambulatory Visit: Payer: Managed Care, Other (non HMO)

## 2020-06-12 ENCOUNTER — Encounter: Payer: Self-pay | Admitting: *Deleted

## 2020-06-12 VITALS — BP 103/70 | HR 100 | Wt 125.0 lb

## 2020-06-12 DIAGNOSIS — O0990 Supervision of high risk pregnancy, unspecified, unspecified trimester: Secondary | ICD-10-CM

## 2020-06-12 DIAGNOSIS — Z1589 Genetic susceptibility to other disease: Secondary | ICD-10-CM | POA: Insufficient documentation

## 2020-06-12 DIAGNOSIS — Q513 Bicornate uterus: Secondary | ICD-10-CM

## 2020-06-12 MED ORDER — ENOXAPARIN SODIUM 40 MG/0.4ML ~~LOC~~ SOLN: 40.0000 mg | SUBCUTANEOUS | 6 refills | Status: DC

## 2020-06-12 NOTE — Progress Notes (Signed)
Subjective:    Cassie Petersen is a [id number] [redacted]w[redacted]d being seen today for her first obstetrical visit.  Her obstetrical history is significant for underweight, bicornuate uterus. Patient does intend to breast feed. Pregnancy history fully reviewed.  Patient reports nausea and vomiting.  Vitals:   06/12/20 1506  BP: 103/70  Pulse: 100  Weight: 125 lb (56.7 kg)    HISTORY: OB History  Gravida Para Term Preterm AB Living  4 1 1  0 1 1  SAB IAB Ectopic Multiple Live Births  1 0 0   1    # Outcome Date GA Lbr Len/2nd Weight Sex Delivery Anes PTL Lv  4 Current           3 Term 01/13/18 [redacted]w[redacted]d 25:57 / 01:46 8 lb 5.2 oz (3.776 kg) M Vag-Spont EPI  LIV     Birth Comments: moulding  2 Gravida           1 SAB            Past Medical History:  Diagnosis Date  . Bicornuate uterus   . HPV in female   . PONV (postoperative nausea and vomiting)   . Vaginal Pap smear, abnormal    ASCUS   Past Surgical History:  Procedure Laterality Date  . APPENDECTOMY    . BREAST ENHANCEMENT SURGERY    . BREAST SURGERY Bilateral   . DILATION AND EVACUATION N/A 11/17/2019   Procedure: DILATATION AND EVACUATION;  Surgeon: 11/19/2019, MD;  Location: [facility] OR;  Service: Gynecology;  Laterality: N/A;  . MULTIPLE TOOTH EXTRACTIONS     Family History  Problem Relation Age of Onset  . Vision loss Mother   . Breast cancer Paternal Grandmother 38  . Healthy Father   . Healthy Son      Exam    Uterus:     Pelvic Exam:    Perineum: No Hemorrhoids   Vulva: normal   Vagina:  normal mucosa, normal discharge   pH: N/a   Cervix: cervical motion tenderness and closed   Adnexa: normal adnexa   Bony Pelvis: average  System: Breast:  normal appearance, no masses or tenderness   Skin: normal coloration and turgor, no rashes    Neurologic: oriented, normal mood   Extremities: no deformities   HEENT sclera clear, anicteric, neck supple with midline trachea, thyroid without masses and trachea midline    Mouth/Teeth mucous membranes moist, pharynx normal without lesions   Neck supple and no masses   Cardiovascular: regular rate and rhythm   Respiratory:  appears well, vitals normal, no respiratory distress, acyanotic, normal RR, chest clear, no wheezing, crepitations, rhonchi, normal symmetric air entry   Abdomen: soft, non-tender; bowel sounds normal; no masses,  no organomegaly   Urinary: urethral meatus normal      Assessment:    Pregnancy: 52 Patient Active Problem List   Diagnosis Date Noted  . Supervision of high risk pregnancy, antepartum 06/12/2020  . Chronic cough 03/30/2020  . Pap smear of cervix with ASCUS, cannot exclude HGSIL 12/16/2019  . Abnormal Pap smear of cervix 12/02/2019  . Encounter for counseling 10/30/2018  . Bicornuate uterus         Plan:     Initial labs drawn. Prenatal vitamins. Problem list reviewed and updated. Genetic Screening discussed:  NIPS and AFP Anatomy 11/01/2018 at 18-20 Weeks Lovenox in 2nd trimester (40 mg/day) Covid vaccine up to date CIN last pap; ASCCP guidelines is for rpt pap in 1 year.  Bicornuate uterus--discussed risk of cervical incompetence, PTL, breech.  Baby Rx with BP RTC 16 weeks  [name] 06/12/2020

## 2020-06-13 LAB — OBSTETRIC PANEL
Absolute Monocytes: 523 {cells}/uL (ref 200–950)
Antibody Screen: NOT DETECTED
Basophils Absolute: 48 {cells}/uL (ref 0–200)
Basophils Relative: 0.5 %
Eosinophils Absolute: 57 {cells}/uL (ref 15–500)
Eosinophils Relative: 0.6 %
HCT: 38.9 % (ref 35.0–45.0)
Hemoglobin: 13.3 g/dL (ref 11.7–15.5)
Hepatitis B Surface Ag: NONREACTIVE
Lymphs Abs: 2119 {cells}/uL (ref 850–3900)
MCH: 31.1 pg (ref 27.0–33.0)
MCHC: 34.2 g/dL (ref 32.0–36.0)
MCV: 90.9 fL (ref 80.0–100.0)
MPV: 11.6 fL (ref 7.5–12.5)
Monocytes Relative: 5.5 %
Neutro Abs: 6755 {cells}/uL (ref 1500–7800)
Neutrophils Relative %: 71.1 %
Platelets: 266 Thousand/uL (ref 140–400)
RBC: 4.28 Million/uL (ref 3.80–5.10)
RDW: 12.1 % (ref 11.0–15.0)
RPR Ser Ql: NONREACTIVE
Rubella: 4.5 {index}
Total Lymphocyte: 22.3 %
WBC: 9.5 Thousand/uL (ref 3.8–10.8)

## 2020-06-13 LAB — TSH: TSH: 0.53 mIU/L

## 2020-06-13 LAB — HEPATITIS C ANTIBODY
Hepatitis C Ab: NONREACTIVE
SIGNAL TO CUT-OFF: 0.01 (ref <1.00)

## 2020-06-13 LAB — CERVICOVAGINAL ANCILLARY ONLY
Chlamydia: NEGATIVE
Comment: NEGATIVE
Comment: NORMAL
Neisseria Gonorrhea: NEGATIVE

## 2020-06-13 LAB — VITAMIN D 25 HYDROXY (VIT D DEFICIENCY, FRACTURES): Vit D, 25-Hydroxy: 28 ng/mL — ABNORMAL LOW (ref 30–100)

## 2020-06-13 LAB — HIV ANTIBODY (ROUTINE TESTING W REFLEX): HIV 1&2 Ab, 4th Generation: NONREACTIVE

## 2020-06-14 ENCOUNTER — Encounter: Payer: Self-pay | Admitting: Obstetrics & Gynecology

## 2020-06-14 DIAGNOSIS — Z8639 Personal history of other endocrine, nutritional and metabolic disease: Secondary | ICD-10-CM | POA: Insufficient documentation

## 2020-06-15 LAB — CULTURE, OB URINE

## 2020-06-15 LAB — URINE CULTURE, OB REFLEX

## 2020-06-19 ENCOUNTER — Other Ambulatory Visit: Payer: Self-pay | Admitting: Obstetrics & Gynecology

## 2020-06-27 ENCOUNTER — Encounter: Payer: Self-pay | Admitting: *Deleted

## 2020-06-27 DIAGNOSIS — O0990 Supervision of high risk pregnancy, unspecified, unspecified trimester: Secondary | ICD-10-CM

## 2020-07-10 ENCOUNTER — Ambulatory Visit (INDEPENDENT_AMBULATORY_CARE_PROVIDER_SITE_OTHER): Payer: Managed Care, Other (non HMO) | Admitting: Obstetrics & Gynecology

## 2020-07-10 ENCOUNTER — Other Ambulatory Visit: Payer: Self-pay

## 2020-07-10 VITALS — BP 94/53 | HR 83 | Wt 129.0 lb

## 2020-07-10 DIAGNOSIS — Q513 Bicornate uterus: Secondary | ICD-10-CM

## 2020-07-10 DIAGNOSIS — O0990 Supervision of high risk pregnancy, unspecified, unspecified trimester: Secondary | ICD-10-CM

## 2020-07-10 DIAGNOSIS — Z1589 Genetic susceptibility to other disease: Secondary | ICD-10-CM

## 2020-07-10 NOTE — Progress Notes (Signed)
    PRENATAL VISIT NOTE  Subjective:  Cassie Petersen is a 35 y.o. G4P1011 at [redacted]w[redacted]d being seen today for ongoing prenatal care.  She is currently monitored for the following issues for this high-risk pregnancy and has Bicornuate uterus; Abnormal Pap smear of cervix; Supervision of high risk pregnancy, antepartum; PAI-1 4G/4G genotype; and History of vitamin D deficiency on their problem list.  Patient reports no complaints.   .  .   . Denies leaking of fluid.   The following portions of the patient's history were reviewed and updated as appropriate: allergies, current medications, past family history, past medical history, past social history, past surgical history and problem list.   Objective:  There were no vitals filed for this visit.  Fetal Status:           General:  Alert, oriented and cooperative. Patient is in no acute distress.  Skin: Skin is warm and dry. No rash noted.   Cardiovascular: Normal heart rate noted  Respiratory: Normal respiratory effort, no problems with respiration noted  Abdomen: Soft, gravid, appropriate for gestational age.        Pelvic: Cervical exam deferred        Extremities: Normal range of motion.     Mental Status: Normal mood and affect. Normal behavior. Normal judgment and thought content.   Assessment and Plan:  Pregnancy: G4P1011 at [redacted]w[redacted]d 1. PAI-1 4G/4G genotype Continue Lovenox  2. Supervision of high risk pregnancy, antepartum Anatomy US ordered AFP today Baby Rx not crossing  3. Bicornuate uterus Monitor for signs and symptoms of PTL  Preterm labor symptoms and general obstetric precautions including but not limited to vaginal bleeding, contractions, leaking of fluid and fetal movement were reviewed in detail with the patient. Please refer to After Visit Summary for other counseling recommendations.   RTC 4 weeks Pt to call Baby Rx  [name], MD

## 2020-07-11 LAB — ALPHA FETOPROTEIN, MATERNAL
AFP MoM: 1.19
AFP, Serum: 46.1 ng/mL
Calc'd Gestational Age: 16.1 weeks
Maternal Wt: 126 [lb_av]
Risk for ONTD: <1
Twins-AFP: 1

## 2020-08-01 ENCOUNTER — Ambulatory Visit: Payer: Managed Care, Other (non HMO)

## 2020-08-01 ENCOUNTER — Other Ambulatory Visit: Payer: Managed Care, Other (non HMO)

## 2020-08-02 ENCOUNTER — Ambulatory Visit: Payer: Managed Care, Other (non HMO) | Attending: Obstetrics & Gynecology | Admitting: *Deleted

## 2020-08-02 ENCOUNTER — Other Ambulatory Visit: Payer: Self-pay

## 2020-08-02 ENCOUNTER — Other Ambulatory Visit: Payer: Self-pay | Admitting: Obstetrics

## 2020-08-02 ENCOUNTER — Ambulatory Visit (HOSPITAL_BASED_OUTPATIENT_CLINIC_OR_DEPARTMENT_OTHER): Payer: Managed Care, Other (non HMO)

## 2020-08-02 DIAGNOSIS — O99112 Other diseases of the blood and blood-forming organs and certain disorders involving the immune mechanism complicating pregnancy, second trimester: Secondary | ICD-10-CM | POA: Diagnosis not present

## 2020-08-02 DIAGNOSIS — Z362 Encounter for other antenatal screening follow-up: Secondary | ICD-10-CM

## 2020-08-02 DIAGNOSIS — Z363 Encounter for antenatal screening for malformations: Secondary | ICD-10-CM | POA: Insufficient documentation

## 2020-08-02 DIAGNOSIS — O0990 Supervision of high risk pregnancy, unspecified, unspecified trimester: Secondary | ICD-10-CM | POA: Diagnosis not present

## 2020-08-02 DIAGNOSIS — Q513 Bicornate uterus: Secondary | ICD-10-CM | POA: Diagnosis not present

## 2020-08-02 DIAGNOSIS — Z87798 Personal history of other (corrected) congenital malformations: Secondary | ICD-10-CM | POA: Insufficient documentation

## 2020-08-02 DIAGNOSIS — Z3A19 19 weeks gestation of pregnancy: Secondary | ICD-10-CM | POA: Diagnosis not present

## 2020-08-02 DIAGNOSIS — D6859 Other primary thrombophilia: Secondary | ICD-10-CM | POA: Diagnosis not present

## 2020-08-02 DIAGNOSIS — O3402 Maternal care for unspecified congenital malformation of uterus, second trimester: Secondary | ICD-10-CM | POA: Insufficient documentation

## 2020-08-02 DIAGNOSIS — O34592 Maternal care for other abnormalities of gravid uterus, second trimester: Secondary | ICD-10-CM

## 2020-08-07 ENCOUNTER — Other Ambulatory Visit: Payer: Self-pay

## 2020-08-07 ENCOUNTER — Encounter: Payer: Self-pay | Admitting: Obstetrics and Gynecology

## 2020-08-07 ENCOUNTER — Ambulatory Visit (INDEPENDENT_AMBULATORY_CARE_PROVIDER_SITE_OTHER): Payer: Managed Care, Other (non HMO) | Admitting: Obstetrics and Gynecology

## 2020-08-07 VITALS — BP 100/56 | HR 80 | Wt 129.0 lb

## 2020-08-07 DIAGNOSIS — Z3A20 20 weeks gestation of pregnancy: Secondary | ICD-10-CM

## 2020-08-07 DIAGNOSIS — Q513 Bicornate uterus: Secondary | ICD-10-CM

## 2020-08-07 DIAGNOSIS — Z1589 Genetic susceptibility to other disease: Secondary | ICD-10-CM

## 2020-08-07 DIAGNOSIS — O0990 Supervision of high risk pregnancy, unspecified, unspecified trimester: Secondary | ICD-10-CM

## 2020-08-07 NOTE — Progress Notes (Signed)
   PRENATAL VISIT NOTE  Subjective:  Cassie Petersen is a 35 y.o. [phone] at [redacted]w[redacted]d being seen today for ongoing prenatal care.  She is currently monitored for the following issues for this high-risk pregnancy and has Bicornuate uterus; Abnormal Pap smear of cervix; Supervision of high risk pregnancy, antepartum; PAI-1 4G/4G genotype; and History of vitamin D deficiency on their problem list.  Patient reports no complaints.  Contractions: Not present. Vag. Bleeding: None.  Movement: Present. Denies leaking of fluid.   The following portions of the patient's history were reviewed and updated as appropriate: allergies, current medications, past family history, past medical history, past social history, past surgical history and problem list.   Objective:   Vitals:   08/07/20 1517  BP: (!) 100/56  Pulse: 80  Weight: 129 lb (58.5 kg)    Fetal Status: Fetal Heart Rate (bpm): 146   Movement: Present     General:  Alert, oriented and cooperative. Patient is in no acute distress.  Skin: Skin is warm and dry. No rash noted.   Cardiovascular: Normal heart rate noted  Respiratory: Normal respiratory effort, no problems with respiration noted  Abdomen: Soft, gravid, appropriate for gestational age.  Pain/Pressure: Absent     Pelvic: Cervical exam deferred        Extremities: Normal range of motion.  Edema: None  Mental Status: Normal mood and affect. Normal behavior. Normal judgment and thought content.   Assessment and Plan:  Pregnancy: G4P1021 at [redacted]w[redacted]d 1. Supervision of high risk pregnancy, antepartum  2. Bicornuate uterus  3. PAI-1 4G/4G genotype Cont prophylactic lovenox  4. [redacted] weeks gestation of pregnancy   Preterm labor symptoms and general obstetric precautions including but not limited to vaginal bleeding, contractions, leaking of fluid and fetal movement were reviewed in detail with the patient. Please refer to After Visit Summary for other counseling recommendations.    Return in about 4 weeks (around 09/04/2020) for high OB, in person.  Future Appointments  Date Time Provider Department Center  08/31/2020  8:30 AM [facility] NURSE [facility]  08/31/2020  8:45 AM [facility] US4 [facility]  09/04/2020  3:15 PM Leggett, Fredrich Romans, MD [facility]    Conan Bowens, MD

## 2020-08-14 ENCOUNTER — Encounter: Payer: Managed Care, Other (non HMO) | Admitting: Obstetrics & Gynecology

## 2020-08-31 ENCOUNTER — Encounter: Payer: Self-pay | Admitting: *Deleted

## 2020-08-31 ENCOUNTER — Ambulatory Visit: Payer: Managed Care, Other (non HMO) | Admitting: *Deleted

## 2020-08-31 ENCOUNTER — Other Ambulatory Visit: Payer: Self-pay | Admitting: *Deleted

## 2020-08-31 ENCOUNTER — Ambulatory Visit: Payer: Managed Care, Other (non HMO) | Attending: Obstetrics

## 2020-08-31 ENCOUNTER — Other Ambulatory Visit: Payer: Self-pay

## 2020-08-31 DIAGNOSIS — Z3A23 23 weeks gestation of pregnancy: Secondary | ICD-10-CM

## 2020-08-31 DIAGNOSIS — Z362 Encounter for other antenatal screening follow-up: Secondary | ICD-10-CM | POA: Diagnosis not present

## 2020-08-31 DIAGNOSIS — O3402 Maternal care for unspecified congenital malformation of uterus, second trimester: Secondary | ICD-10-CM | POA: Diagnosis not present

## 2020-08-31 DIAGNOSIS — Z87798 Personal history of other (corrected) congenital malformations: Secondary | ICD-10-CM | POA: Insufficient documentation

## 2020-08-31 DIAGNOSIS — O09523 Supervision of elderly multigravida, third trimester: Secondary | ICD-10-CM

## 2020-08-31 DIAGNOSIS — Z363 Encounter for antenatal screening for malformations: Secondary | ICD-10-CM

## 2020-08-31 DIAGNOSIS — D6859 Other primary thrombophilia: Secondary | ICD-10-CM

## 2020-08-31 DIAGNOSIS — O0990 Supervision of high risk pregnancy, unspecified, unspecified trimester: Secondary | ICD-10-CM | POA: Diagnosis present

## 2020-08-31 DIAGNOSIS — Q513 Bicornate uterus: Secondary | ICD-10-CM | POA: Diagnosis not present

## 2020-08-31 DIAGNOSIS — O34592 Maternal care for other abnormalities of gravid uterus, second trimester: Secondary | ICD-10-CM

## 2020-08-31 DIAGNOSIS — O99112 Other diseases of the blood and blood-forming organs and certain disorders involving the immune mechanism complicating pregnancy, second trimester: Secondary | ICD-10-CM | POA: Diagnosis not present

## 2020-09-04 ENCOUNTER — Encounter: Payer: Self-pay | Admitting: Obstetrics & Gynecology

## 2020-09-04 ENCOUNTER — Telehealth (INDEPENDENT_AMBULATORY_CARE_PROVIDER_SITE_OTHER): Payer: Managed Care, Other (non HMO) | Admitting: Obstetrics & Gynecology

## 2020-09-04 VITALS — BP 100/59 | HR 73

## 2020-09-04 DIAGNOSIS — Z3A24 24 weeks gestation of pregnancy: Secondary | ICD-10-CM

## 2020-09-04 DIAGNOSIS — Z1589 Genetic susceptibility to other disease: Secondary | ICD-10-CM

## 2020-09-04 DIAGNOSIS — O0990 Supervision of high risk pregnancy, unspecified, unspecified trimester: Secondary | ICD-10-CM

## 2020-09-04 DIAGNOSIS — Q513 Bicornate uterus: Secondary | ICD-10-CM

## 2020-09-04 DIAGNOSIS — Z8639 Personal history of other endocrine, nutritional and metabolic disease: Secondary | ICD-10-CM

## 2020-09-04 DIAGNOSIS — O3402 Maternal care for unspecified congenital malformation of uterus, second trimester: Secondary | ICD-10-CM

## 2020-09-04 NOTE — Progress Notes (Signed)
No weight to document  I connected with Cassie Petersen 09/04/20 at  3:15 PM EDT by: MyChart video and verified that I am speaking with the correct person using two identifiers.  Patient is located at home and provider is located at [place].     The purpose of this virtual visit is to provide medical care while limiting exposure to the novel coronavirus. I discussed the limitations, risks, security and privacy concerns of performing an evaluation and management service by MyChart video and the availability of in person appointments. I also discussed with the patient that there may be a patient responsible charge related to this service. By engaging in this virtual visit, you consent to the provision of healthcare.  Additionally, you authorize for your insurance to be billed for the services provided during this visit.  The patient expressed understanding and agreed to proceed.  The following staff members participated in the virtual visit:  Chestine Spore, RN    PRENATAL VISIT NOTE  Subjective:  Cassie Petersen is a 35 y.o. [phone] at 109w1d  for phone visit for ongoing prenatal care.  She is currently monitored for the following issues for this low-risk pregnancy and has Bicornuate uterus; Abnormal Pap smear of cervix; Supervision of high risk pregnancy, antepartum; PAI-1 4G/4G genotype; and History of vitamin D deficiency on their problem list.  Patient reports no complaints.  Contractions: Not present. Vag. Bleeding: None.  Movement: Present. Denies leaking of fluid.   The following portions of the patient's history were reviewed and updated as appropriate: allergies, current medications, past family history, past medical history, past social history, past surgical history and problem list.   Objective:   Vitals:   09/04/20 1359  BP: (!) 100/59  Pulse: 73   Self-Obtained  Fetal Status:     Movement: Present     Assessment and Plan:  Pregnancy: G4P1021 at [redacted]w[redacted]d 1. Supervision of high risk  pregnancy, antepartum Cont Lovenox; Would like to stop Lovenox at 37 weeks and go into natural labor.  Will consult with MFM.    Preterm labor symptoms and general obstetric precautions including but not limited to vaginal bleeding, contractions, leaking of fluid and fetal movement were reviewed in detail with the patient.  No follow-ups on file.  Future Appointments  Date Time Provider Department Center  09/04/2020  3:15 PM Lesly Dukes, MD [facility]  10/26/2020  9:30 AM [facility] NURSE [facility]  10/26/2020  9:45 AM [facility] US5 [facility]     Time spent on virtual visit: 12 minutes  [name], MD

## 2020-09-27 IMAGING — DX DG CHEST 2V
2 series · 2 of 2 positions shown · non-contrast
Comparison: None.

CLINICAL DATA: Cough for several weeks, fully vaccinated

EXAM:
CHEST - 2 VIEW

[chest pa]
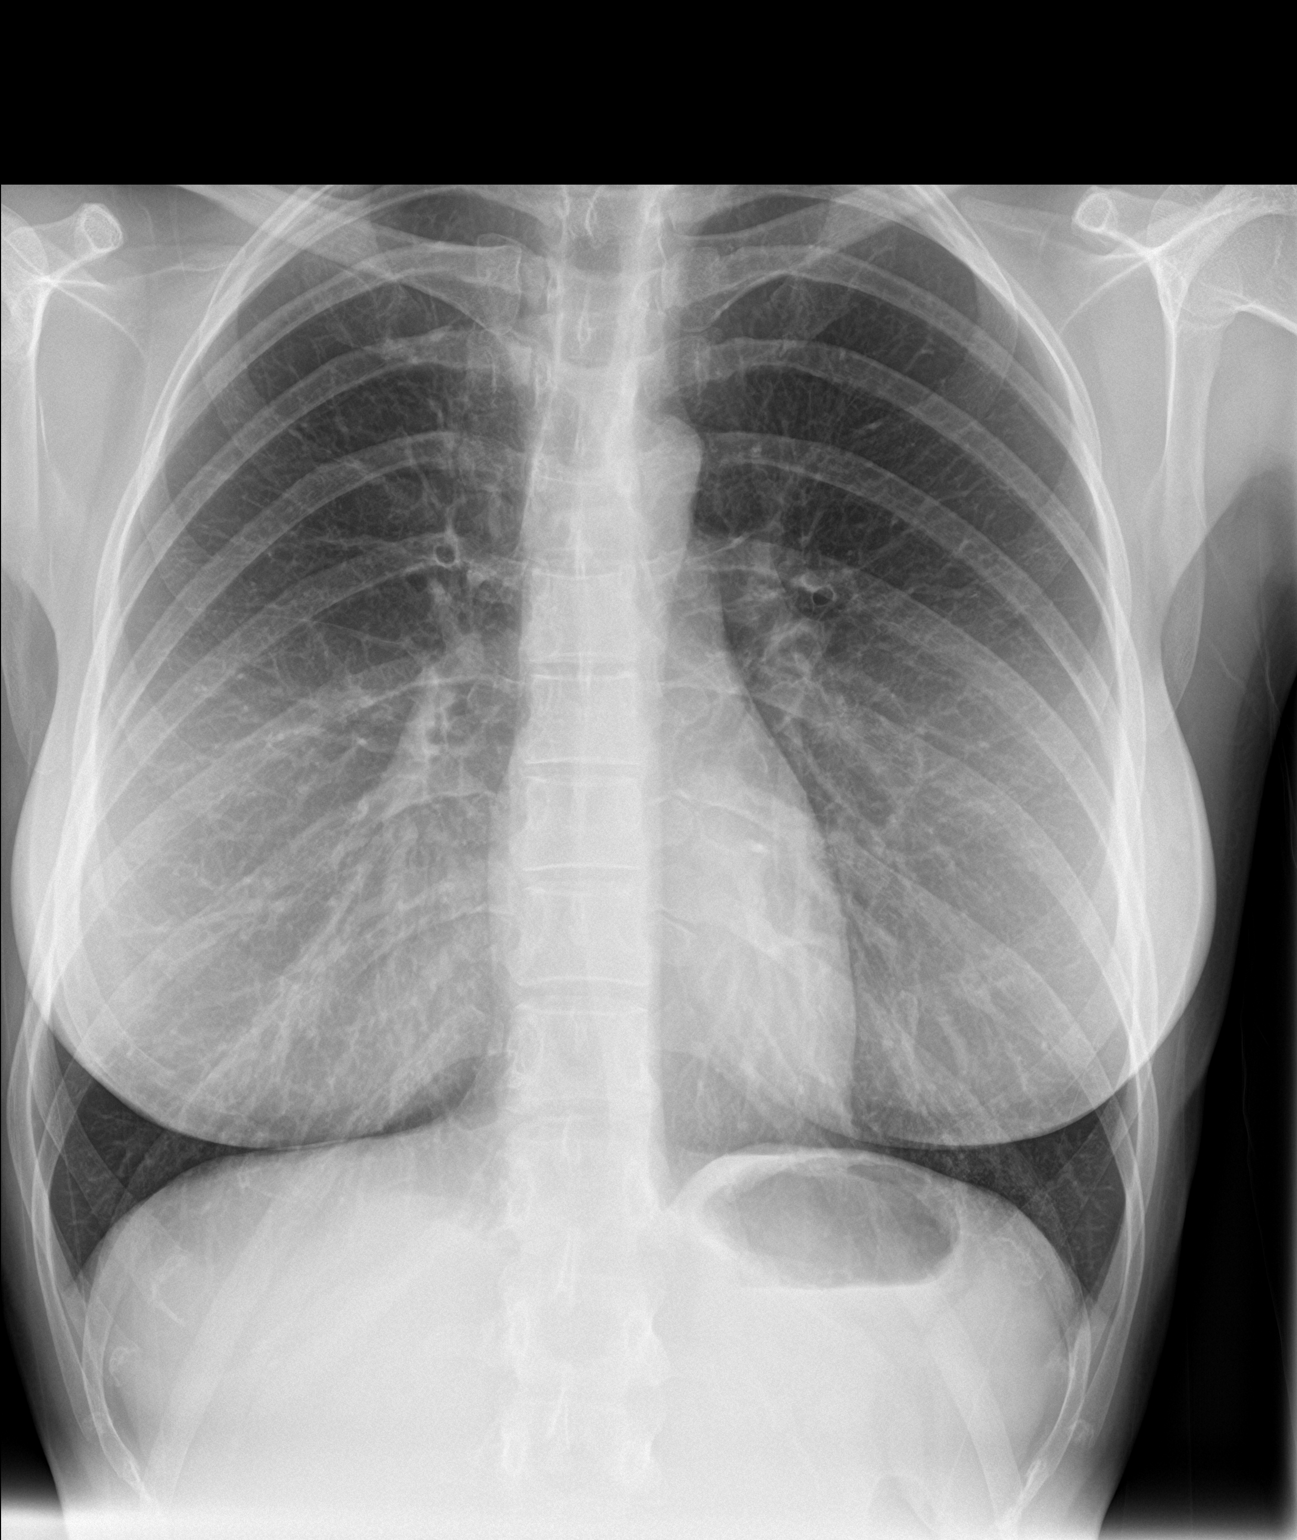

[chest lat]
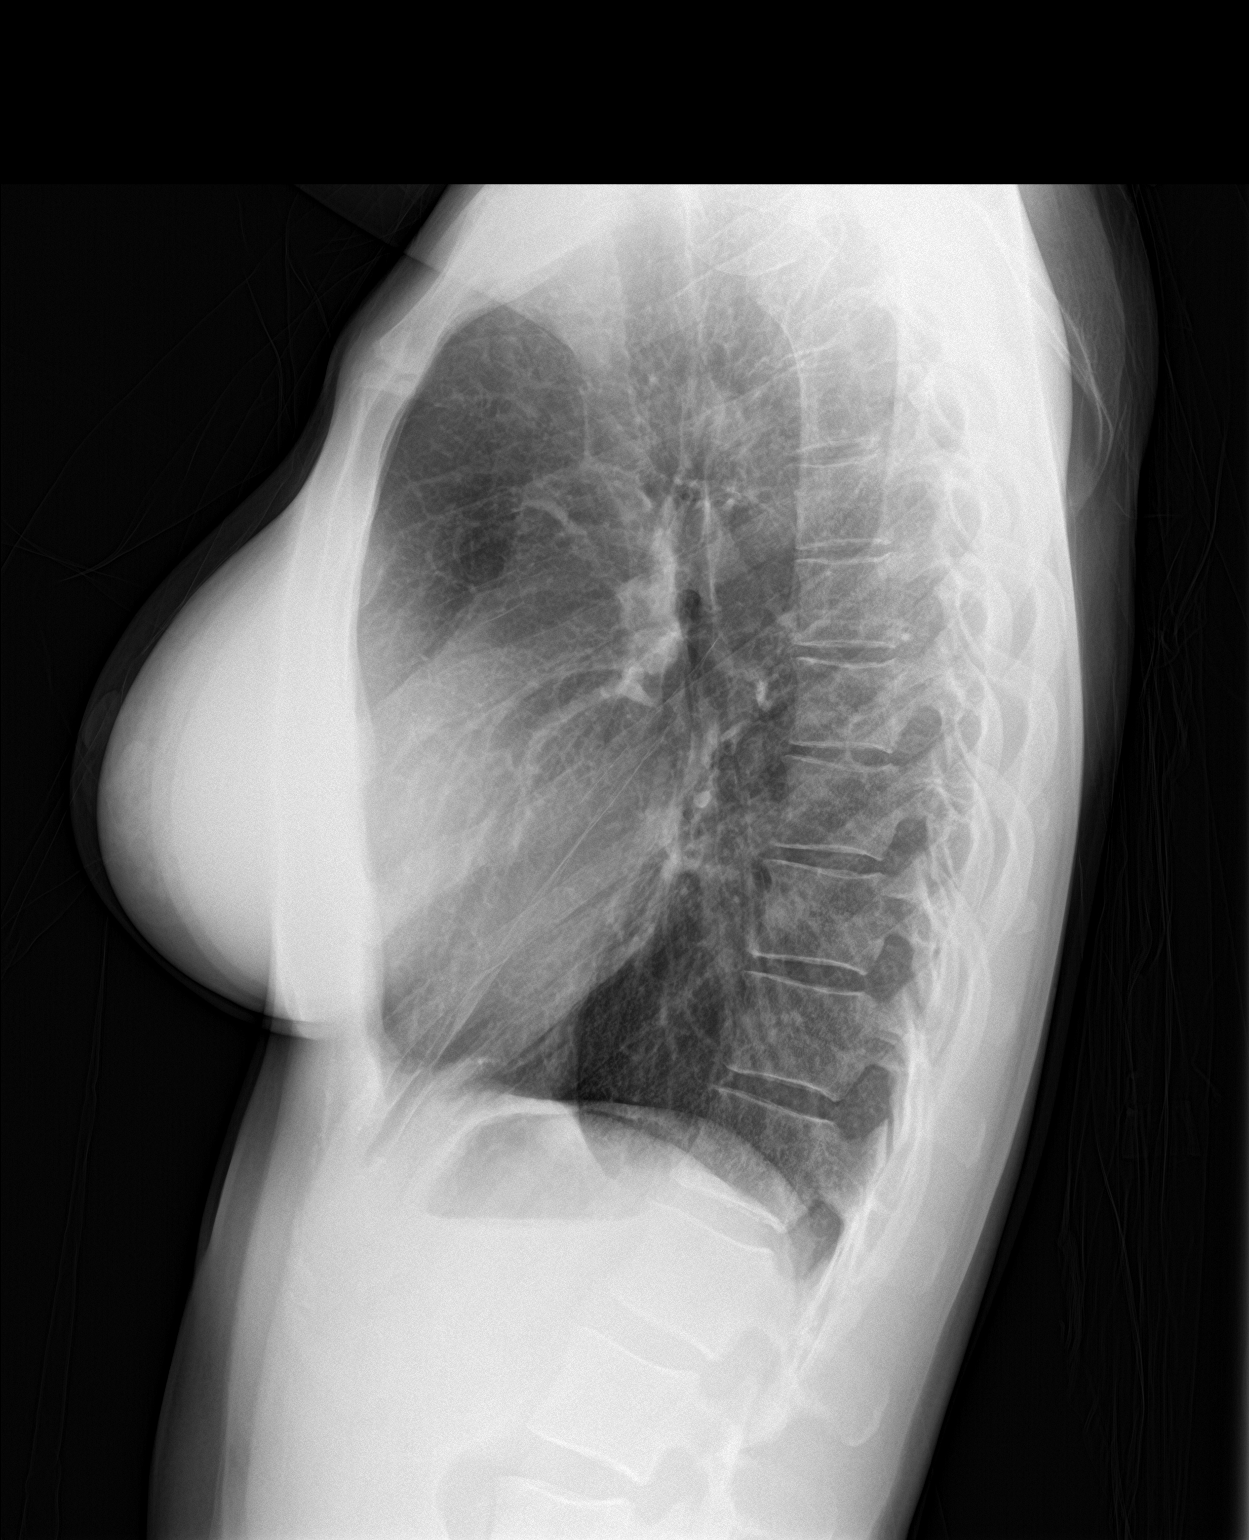

[2 of 2 positions shown; findings below may reference images not displayed]

FINDINGS: The heart size and mediastinal contours are within normal limits.
Both lungs are clear. The visualized skeletal structures are
unremarkable. Bilateral breast implants are noted.
IMPRESSION: No active cardiopulmonary disease.

## 2020-10-02 ENCOUNTER — Encounter: Payer: Managed Care, Other (non HMO) | Admitting: Obstetrics & Gynecology

## 2020-10-04 ENCOUNTER — Other Ambulatory Visit: Payer: Self-pay

## 2020-10-04 ENCOUNTER — Ambulatory Visit (INDEPENDENT_AMBULATORY_CARE_PROVIDER_SITE_OTHER): Payer: Managed Care, Other (non HMO) | Admitting: Obstetrics & Gynecology

## 2020-10-04 VITALS — BP 108/62 | HR 99 | Wt 139.0 lb

## 2020-10-04 DIAGNOSIS — Z23 Encounter for immunization: Secondary | ICD-10-CM | POA: Diagnosis not present

## 2020-10-04 DIAGNOSIS — Z3A28 28 weeks gestation of pregnancy: Secondary | ICD-10-CM

## 2020-10-04 DIAGNOSIS — O0990 Supervision of high risk pregnancy, unspecified, unspecified trimester: Secondary | ICD-10-CM

## 2020-10-04 DIAGNOSIS — O0993 Supervision of high risk pregnancy, unspecified, third trimester: Secondary | ICD-10-CM

## 2020-10-04 NOTE — Progress Notes (Signed)
   PRENATAL VISIT NOTE  Subjective:  Cassie Petersen is a 35 y.o. [phone] at [redacted]w[redacted]d being seen today for ongoing prenatal care.  She is currently monitored for the following issues for this high-risk pregnancy and has Bicornuate uterus; Abnormal Pap smear of cervix; Supervision of high risk pregnancy, antepartum; PAI-1 4G/4G genotype; and History of vitamin D deficiency on their problem list.  Patient reports no complaints.  Contractions: Not present. Vag. Bleeding: None.  Movement: Present. Denies leaking of fluid.   The following portions of the patient's history were reviewed and updated as appropriate: allergies, current medications, past family history, past medical history, past social history, past surgical history and problem list.   Objective:   Vitals:   10/04/20 0847  BP: 108/62  Pulse: 99  Weight: 139 lb (63 kg)    Fetal Status: Fetal Heart Rate (bpm): 150 Fundal Height: 27 cm Movement: Present     General:  Alert, oriented and cooperative. Patient is in no acute distress.  Skin: Skin is warm and dry. No rash noted.   Cardiovascular: Normal heart rate noted  Respiratory: Normal respiratory effort, no problems with respiration noted  Abdomen: Soft, gravid, appropriate for gestational age.  Pain/Pressure: Absent     Pelvic: Cervical exam deferred        Extremities: Normal range of motion.  Edema: None  Mental Status: Normal mood and affect. Normal behavior. Normal judgment and thought content.   Assessment and Plan:  Pregnancy: G4P1021 at [redacted]w[redacted]d 1. [redacted] weeks gestation of pregnancy - 2Hr GTT w/ 1 Hr Carpenter 75 g - HIV antibody (with reflex) - CBC - RPR  2. Supervision of high risk pregnancy, antepartum - TDAP - 2Hr GTT w/ 1 Hr Carpenter 75 g - HIV antibody (with reflex) - CBC - RPR - Continue Lovenox for PAI 4/5 with plan to stop at 36 weeks. There is not a risk of DVT with this mutation.  Preterm labor symptoms and general obstetric precautions including but  not limited to vaginal bleeding, contractions, leaking of fluid and fetal movement were reviewed in detail with the patient. Please refer to After Visit Summary for other counseling recommendations.   No follow-ups on file.  Future Appointments  Date Time Provider Department Center  10/26/2020  9:30 AM [facility] NURSE [facility]  10/26/2020  9:45 AM [facility] US5 [facility]    [name], MD

## 2020-10-05 LAB — 2HR GTT W 1 HR, CARPENTER, 75 G
Glucose, 1 Hr, Gest: 107 mg/dL (ref 65–179)
Glucose, 2 Hr, Gest: 78 mg/dL (ref 65–152)
Glucose, Fasting, Gest: 73 mg/dL (ref 65–91)

## 2020-10-05 LAB — CBC
HCT: 37.1 % (ref 35.0–45.0)
Hemoglobin: 12.8 g/dL (ref 11.7–15.5)
MCH: 32.4 pg (ref 27.0–33.0)
MCHC: 34.5 g/dL (ref 32.0–36.0)
MCV: 93.9 fL (ref 80.0–100.0)
MPV: 11.3 fL (ref 7.5–12.5)
Platelets: 158 10*3/uL (ref 140–400)
RBC: 3.95 10*6/uL (ref 3.80–5.10)
RDW: 12.4 % (ref 11.0–15.0)
WBC: 11.1 10*3/uL — ABNORMAL HIGH (ref 3.8–10.8)

## 2020-10-05 LAB — RPR: RPR Ser Ql: NONREACTIVE

## 2020-10-05 LAB — HIV ANTIBODY (ROUTINE TESTING W REFLEX): HIV 1&2 Ab, 4th Generation: NONREACTIVE

## 2020-10-26 ENCOUNTER — Ambulatory Visit: Payer: Managed Care, Other (non HMO)

## 2020-11-03 ENCOUNTER — Other Ambulatory Visit: Payer: Self-pay

## 2020-11-03 ENCOUNTER — Ambulatory Visit (INDEPENDENT_AMBULATORY_CARE_PROVIDER_SITE_OTHER): Payer: Managed Care, Other (non HMO) | Admitting: Certified Nurse Midwife

## 2020-11-03 VITALS — BP 101/63 | HR 97 | Wt 141.0 lb

## 2020-11-03 DIAGNOSIS — Z3A32 32 weeks gestation of pregnancy: Secondary | ICD-10-CM

## 2020-11-03 DIAGNOSIS — Z1589 Genetic susceptibility to other disease: Secondary | ICD-10-CM

## 2020-11-03 DIAGNOSIS — O0990 Supervision of high risk pregnancy, unspecified, unspecified trimester: Secondary | ICD-10-CM

## 2020-11-03 NOTE — Progress Notes (Signed)
Subjective:  Cassie Petersen is a 35 y.o. [phone] at [redacted]w[redacted]d being seen today for ongoing prenatal care.  She is currently monitored for the following issues for this high-risk pregnancy and has Bicornuate uterus; Abnormal Pap smear of cervix; Supervision of high risk pregnancy, antepartum; PAI-1 4G/4G genotype; and History of vitamin D deficiency on their problem list.  Patient reports no complaints.  Contractions: Not present. Vag. Bleeding: None.  Movement: Present. Denies leaking of fluid.   The following portions of the patient's history were reviewed and updated as appropriate: allergies, current medications, past family history, past medical history, past social history, past surgical history and problem list. Problem list updated.  Objective:   Vitals:   11/03/20 0814  BP: 101/63  Pulse: 97  Weight: 141 lb (64 kg)    Fetal Status: Fetal Heart Rate (bpm): 148 Fundal Height: 33 cm Movement: Present  Presentation: Vertex  General:  Alert, oriented and cooperative. Patient is in no acute distress.  Skin: Skin is warm and dry. No rash noted.   Cardiovascular: Normal heart rate noted  Respiratory: Normal respiratory effort, no problems with respiration noted  Abdomen: Soft, gravid, appropriate for gestational age. Pain/Pressure: Absent     Pelvic: Vag. Bleeding: None Vag D/C Character: Thin   Cervical exam deferred        Extremities: Normal range of motion.  Edema: None  Mental Status: Normal mood and affect. Normal behavior. Normal judgment and thought content.   Urinalysis:      Assessment and Plan:  Pregnancy: G4P1021 at [redacted]w[redacted]d  1. Supervision of high risk pregnancy, antepartum  2. PAI-1 4G/4G genotype - daily Lovenox  3. 32 weeks  Preterm labor symptoms and general obstetric precautions including but not limited to vaginal bleeding, contractions, leaking of fluid and fetal movement were reviewed in detail with the patient. Please refer to After Visit Summary for other  counseling recommendations.  Return in about 2 weeks (around 11/17/2020). virtual   Donette Larry, CNM

## 2020-11-06 ENCOUNTER — Ambulatory Visit (HOSPITAL_BASED_OUTPATIENT_CLINIC_OR_DEPARTMENT_OTHER): Payer: Managed Care, Other (non HMO)

## 2020-11-06 ENCOUNTER — Encounter: Payer: Self-pay | Admitting: *Deleted

## 2020-11-06 ENCOUNTER — Other Ambulatory Visit: Payer: Self-pay

## 2020-11-06 ENCOUNTER — Ambulatory Visit: Payer: Managed Care, Other (non HMO) | Attending: Obstetrics and Gynecology | Admitting: *Deleted

## 2020-11-06 DIAGNOSIS — O99113 Other diseases of the blood and blood-forming organs and certain disorders involving the immune mechanism complicating pregnancy, third trimester: Secondary | ICD-10-CM

## 2020-11-06 DIAGNOSIS — O09523 Supervision of elderly multigravida, third trimester: Secondary | ICD-10-CM | POA: Insufficient documentation

## 2020-11-06 DIAGNOSIS — O0990 Supervision of high risk pregnancy, unspecified, unspecified trimester: Secondary | ICD-10-CM

## 2020-11-06 DIAGNOSIS — Z3A33 33 weeks gestation of pregnancy: Secondary | ICD-10-CM

## 2020-11-06 DIAGNOSIS — D6859 Other primary thrombophilia: Secondary | ICD-10-CM

## 2020-11-06 DIAGNOSIS — O34593 Maternal care for other abnormalities of gravid uterus, third trimester: Secondary | ICD-10-CM | POA: Insufficient documentation

## 2020-11-06 DIAGNOSIS — Z87798 Personal history of other (corrected) congenital malformations: Secondary | ICD-10-CM | POA: Insufficient documentation

## 2020-11-06 DIAGNOSIS — Z7901 Long term (current) use of anticoagulants: Secondary | ICD-10-CM | POA: Insufficient documentation

## 2020-11-06 DIAGNOSIS — Q513 Bicornate uterus: Secondary | ICD-10-CM | POA: Diagnosis not present

## 2020-11-27 ENCOUNTER — Other Ambulatory Visit: Payer: Self-pay

## 2020-11-27 ENCOUNTER — Ambulatory Visit (INDEPENDENT_AMBULATORY_CARE_PROVIDER_SITE_OTHER): Payer: Managed Care, Other (non HMO) | Admitting: Obstetrics & Gynecology

## 2020-11-27 ENCOUNTER — Other Ambulatory Visit (HOSPITAL_COMMUNITY)
Admission: RE | Admit: 2020-11-27 | Discharge: 2020-11-27 | Disposition: A | Discharge status: Home / Self Care | Payer: Managed Care, Other (non HMO) | Source: Ambulatory Visit | Attending: Obstetrics & Gynecology | Admitting: Obstetrics & Gynecology

## 2020-11-27 DIAGNOSIS — O0993 Supervision of high risk pregnancy, unspecified, third trimester: Secondary | ICD-10-CM

## 2020-11-27 DIAGNOSIS — O0990 Supervision of high risk pregnancy, unspecified, unspecified trimester: Secondary | ICD-10-CM | POA: Diagnosis present

## 2020-11-27 DIAGNOSIS — Z3A36 36 weeks gestation of pregnancy: Secondary | ICD-10-CM

## 2020-11-27 LAB — OB RESULTS CONSOLE GBS: GBS: NEGATIVE

## 2020-11-27 MED ORDER — FLUCONAZOLE 150 MG PO TABS: 150.0000 mg | ORAL_TABLET | Freq: Once | ORAL | 0 refills | Status: AC

## 2020-11-27 NOTE — Progress Notes (Addendum)
PHQ 9: Score 0 GAD: Score 0    PRENATAL VISIT NOTE  Subjective:  Cassie Petersen is a 35 y.o. [phone] at [redacted]w[redacted]d being seen today for ongoing prenatal care.  She is currently monitored for the following issues for this low-risk pregnancy and has Bicornuate uterus; Abnormal Pap smear of cervix; Supervision of high risk pregnancy, antepartum; PAI-1 4G/4G genotype; and History of vitamin D deficiency on their problem list.  Patient reports no complaints.  Contractions: Not present. Vag. Bleeding: None.  Movement: Present. Denies leaking of fluid.   The following portions of the patient's history were reviewed and updated as appropriate: allergies, current medications, past family history, past medical history, past social history, past surgical history and problem list.   Objective:   Vitals:   11/27/20 0823  BP: 104/73  Pulse: (!) 104  Weight: 144 lb (65.3 kg)    Fetal Status: Fetal Heart Rate (bpm): 152   Movement: Present     General:  Alert, oriented and cooperative. Patient is in no acute distress.  Skin: Skin is warm and dry. No rash noted.   Cardiovascular: Normal heart rate noted  Respiratory: Normal respiratory effort, no problems with respiration noted  Abdomen: Soft, gravid, appropriate for gestational age.  Pain/Pressure: Absent     Pelvic: Cervical exam performed in the presence of a chaperone        Extremities: Normal range of motion.  Edema: None  Mental Status: Normal mood and affect. Normal behavior. Normal judgment and thought content.   Assessment and Plan:  Pregnancy: G4P1021 at [redacted]w[redacted]d 1. Supervision of high risk pregnancy, antepartum Bedside US shows vertex - Culture, beta strep (group b only) - Cervicovaginal ancillary only( Qulin) - Pt stopped Lovenox  2.  Patient had severe yeast infection post partum and was unable to gethelp over the weekend.  Pt requests one Diflucan to be prescribed so that she can have on hand in case that occurs again post  partum.   Term labor symptoms and general obstetric precautions including but not limited to vaginal bleeding, contractions, leaking of fluid and fetal movement were reviewed in detail with the patient. Please refer to After Visit Summary for other counseling recommendations.   No follow-ups on file.  Future Appointments  Date Time Provider Department Center  12/06/2020  8:15 AM Lesly Dukes, MD [facility]    [name], MD

## 2020-11-28 LAB — CERVICOVAGINAL ANCILLARY ONLY
Chlamydia: NEGATIVE
Comment: NEGATIVE
Comment: NORMAL
Neisseria Gonorrhea: NEGATIVE

## 2020-11-30 LAB — CULTURE, BETA STREP (GROUP B ONLY)
MICRO NUMBER:: 12057211
SPECIMEN QUALITY:: ADEQUATE

## 2020-12-06 ENCOUNTER — Other Ambulatory Visit: Payer: Self-pay

## 2020-12-06 ENCOUNTER — Ambulatory Visit (INDEPENDENT_AMBULATORY_CARE_PROVIDER_SITE_OTHER): Payer: Managed Care, Other (non HMO) | Admitting: Obstetrics & Gynecology

## 2020-12-06 VITALS — BP 99/70 | HR 111 | Wt 144.0 lb

## 2020-12-06 DIAGNOSIS — O0990 Supervision of high risk pregnancy, unspecified, unspecified trimester: Secondary | ICD-10-CM

## 2020-12-06 DIAGNOSIS — Z3A37 37 weeks gestation of pregnancy: Secondary | ICD-10-CM

## 2020-12-06 NOTE — Progress Notes (Signed)
Baby is head down and facing toward pt's back .    PRENATAL VISIT NOTE  Subjective:  Cassie Petersen is a 35 y.o. [phone] at [redacted]w[redacted]d being seen today for ongoing prenatal care.  She is currently monitored for the following issues for this low-risk pregnancy and has Bicornuate uterus; Abnormal Pap smear of cervix; Supervision of high risk pregnancy, antepartum; PAI-1 4G/4G genotype; and History of vitamin D deficiency on their problem list.  Patient reports no complaints.  Contractions: Not present. Vag. Bleeding: None.  Movement: Present. Denies leaking of fluid.   The following portions of the patient's history were reviewed and updated as appropriate: allergies, current medications, past family history, past medical history, past social history, past surgical history and problem list.   Objective:   Vitals:   12/06/20 0829  BP: 99/70  Pulse: (!) 111  Weight: 144 lb (65.3 kg)    Fetal Status: Fetal Heart Rate (bpm): 155   Movement: Present     General:  Alert, oriented and cooperative. Patient is in no acute distress.  Skin: Skin is warm and dry. No rash noted.   Cardiovascular: Normal heart rate noted  Respiratory: Normal respiratory effort, no problems with respiration noted  Abdomen: Soft, gravid, appropriate for gestational age.  Pain/Pressure: Absent     Pelvic: Cervical exam deferred        Extremities: Normal range of motion.  Edema: None  Mental Status: Normal mood and affect. Normal behavior. Normal judgment and thought content.   Assessment and Plan:  Pregnancy: G4P1021 at [redacted]w[redacted]d 1. Supervision of high risk pregnancy, antepartum Vertex, measuring normal.  BP normal   Term labor symptoms and general obstetric precautions including but not limited to vaginal bleeding, contractions, leaking of fluid and fetal movement were reviewed in detail with the patient. Please refer to After Visit Summary for other counseling recommendations.   Return in about 1 year (around  12/06/2021).  No future appointments.  [name], MD

## 2020-12-11 ENCOUNTER — Ambulatory Visit (INDEPENDENT_AMBULATORY_CARE_PROVIDER_SITE_OTHER): Payer: Managed Care, Other (non HMO) | Admitting: Obstetrics & Gynecology

## 2020-12-11 ENCOUNTER — Other Ambulatory Visit: Payer: Self-pay

## 2020-12-11 VITALS — BP 111/61 | HR 83 | Wt 144.0 lb

## 2020-12-11 DIAGNOSIS — O0990 Supervision of high risk pregnancy, unspecified, unspecified trimester: Secondary | ICD-10-CM

## 2020-12-11 NOTE — Progress Notes (Signed)
   PRENATAL VISIT NOTE  Subjective:  Cassie Petersen is a 35 y.o. [phone] at [redacted]w[redacted]d being seen today for ongoing prenatal care.  She is currently monitored for the following issues for this low-risk pregnancy and has Bicornuate uterus; Abnormal Pap smear of cervix; Supervision of high risk pregnancy, antepartum; PAI-1 4G/4G genotype; and History of vitamin D deficiency on their problem list.  Patient reports no complaints.  Contractions: Not present. Vag. Bleeding: None.  Movement: Present. Denies leaking of fluid.   The following portions of the patient's history were reviewed and updated as appropriate: allergies, current medications, past family history, past medical history, past social history, past surgical history and problem list.   Objective:   Vitals:   12/11/20 1304  BP: 111/61  Pulse: 83  Weight: 144 lb (65.3 kg)    Fetal Status: Fetal Heart Rate (bpm): 134   Movement: Present     General:  Alert, oriented and cooperative. Patient is in no acute distress.  Skin: Skin is warm and dry. No rash noted.   Cardiovascular: Normal heart rate noted  Respiratory: Normal respiratory effort, no problems with respiration noted  Abdomen: Soft, gravid, appropriate for gestational age.  Pain/Pressure: Absent     Pelvic: Cervical exam deferred      Vtx by [place], ceclines cervical exam  Extremities: Normal range of motion.  Edema: None  Mental Status: Normal mood and affect. Normal behavior. Normal judgment and thought content.   Assessment and Plan:  Pregnancy: G4P1021 at [redacted]w[redacted]d 1. Supervision of high risk pregnancy, antepartum Discussed ARRIVE trial and induction.  Pt wants to discuss with husband.  May choose elective delivery around 40 weeks.   Term labor symptoms and general obstetric precautions including but not limited to vaginal bleeding, contractions, leaking of fluid and fetal movement were reviewed in detail with the patient. Please refer to After Visit Summary for other  counseling recommendations.   Return in about 1 week (around 12/18/2020).  No future appointments.  [name], MD

## 2020-12-15 ENCOUNTER — Telehealth (HOSPITAL_COMMUNITY): Payer: Self-pay | Admitting: *Deleted

## 2020-12-15 ENCOUNTER — Encounter (HOSPITAL_COMMUNITY): Payer: Self-pay | Admitting: *Deleted

## 2020-12-15 IMAGING — US US OB COMP LESS 14 WK
1 series · 15 of 28 positions shown · non-contrast
Comparison: 11/17/2019

CLINICAL DATA: First-trimester pregnancy, viability assessment.

EXAM:
OBSTETRIC <14 WK ULTRASOUND
TECHNIQUE: Transabdominal ultrasound was performed for evaluation of the
gestation as well as the maternal uterus and adnexal regions.

[Series 1: us ob comp less 14 wk · 15 of 45 slices shown]
[im 1/45]
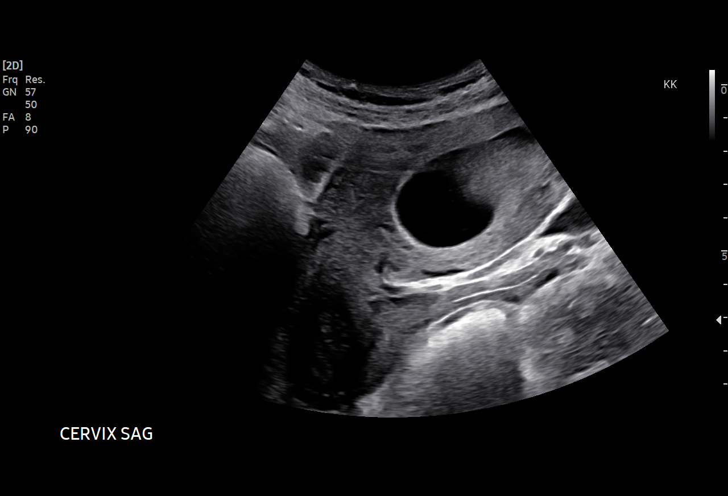
[im 4/45]
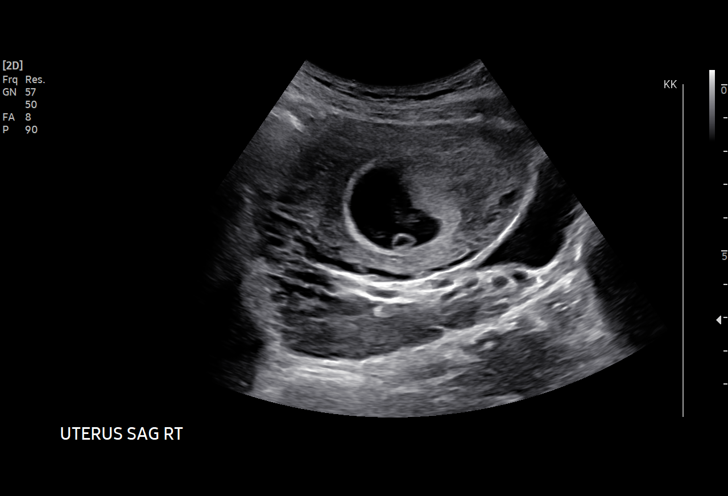
[im 7/45]
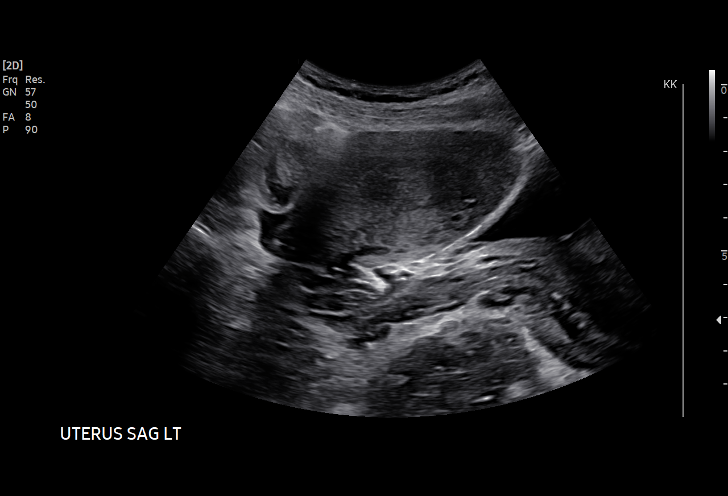
[im 10/45]
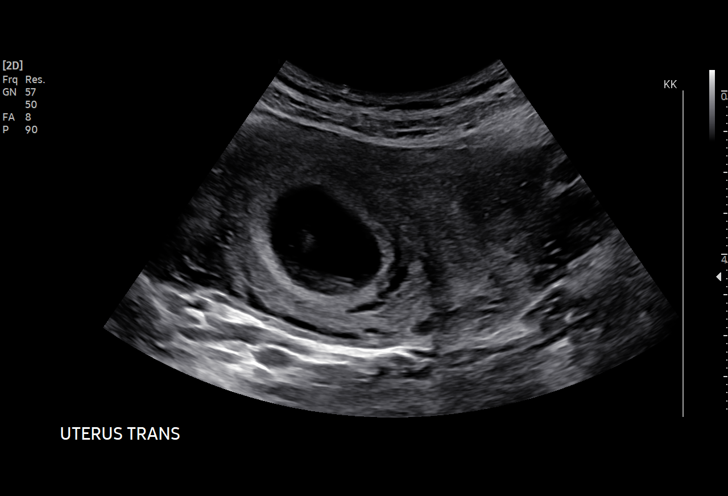
[im 14/45]
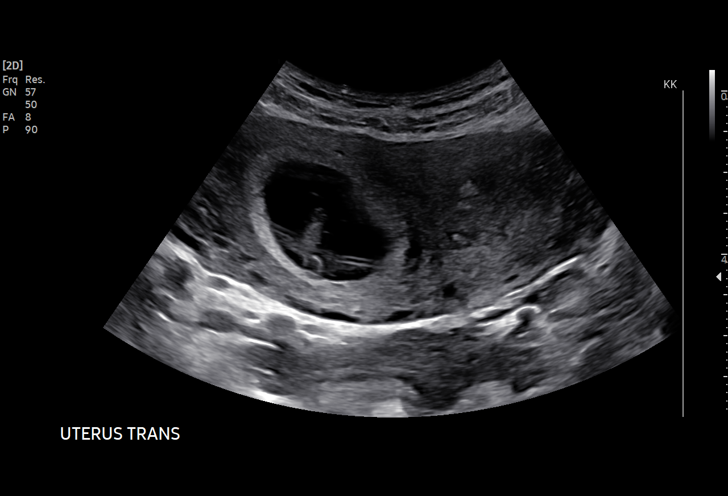
[im 17/45]
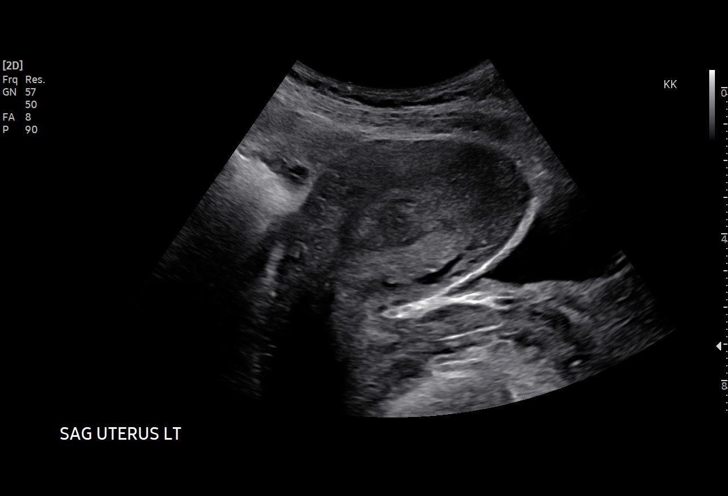
[im 20/45]
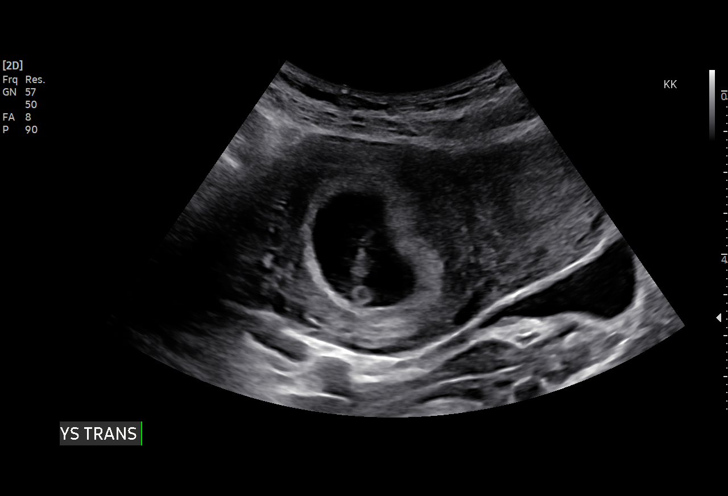
[im 23/45]
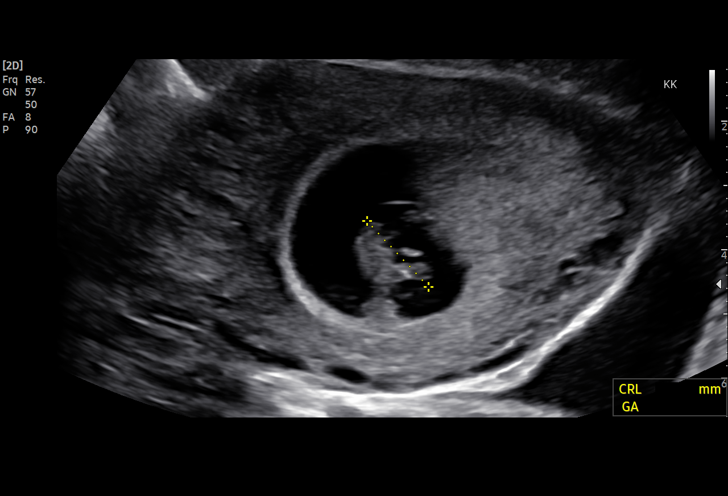
[im 25/45]
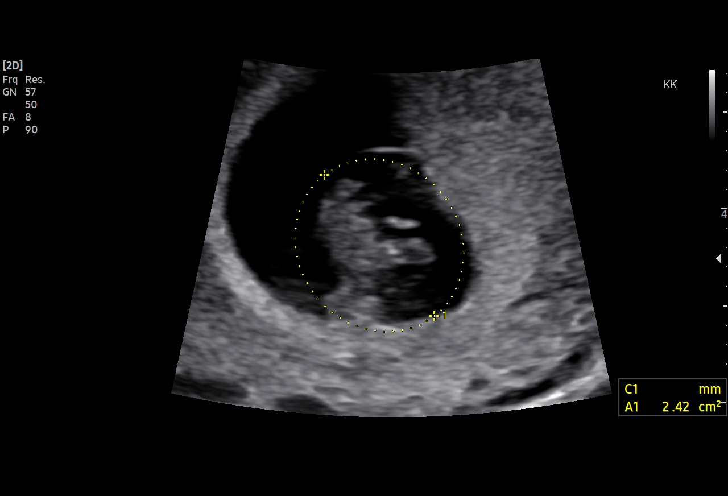
[im 28/45]
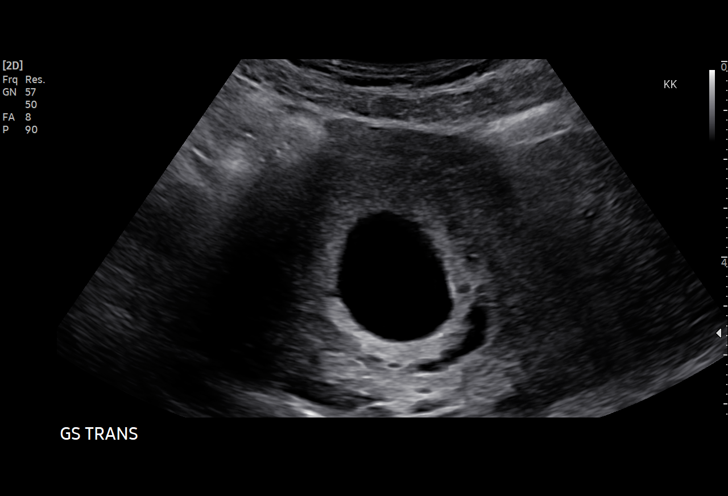
[im 31/45]
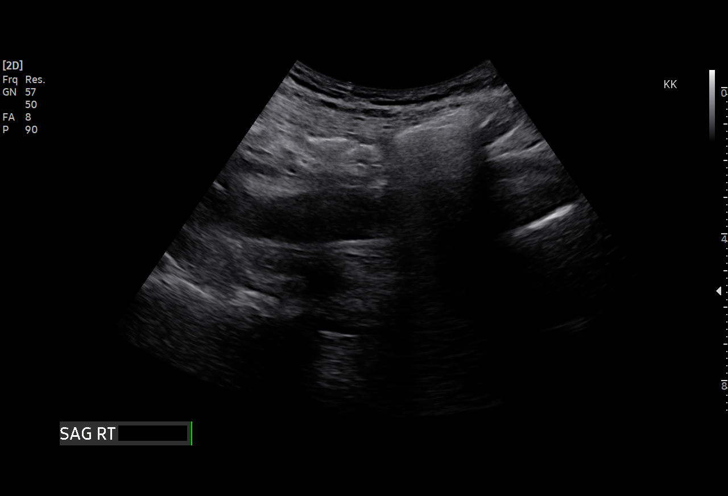
[im 35/45]
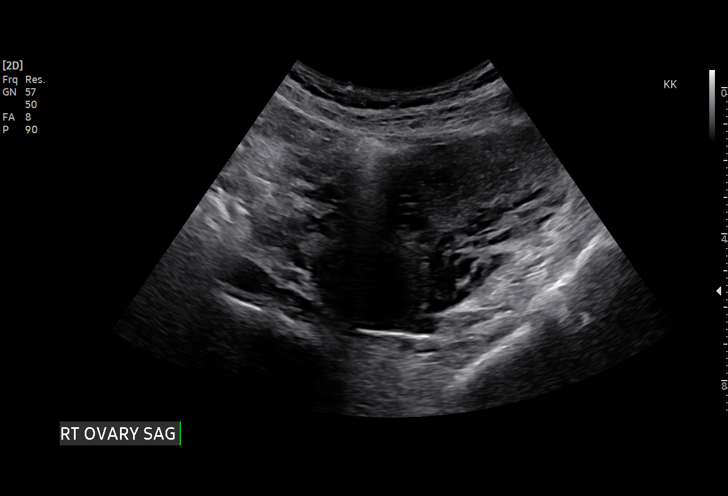
[im 38/45]
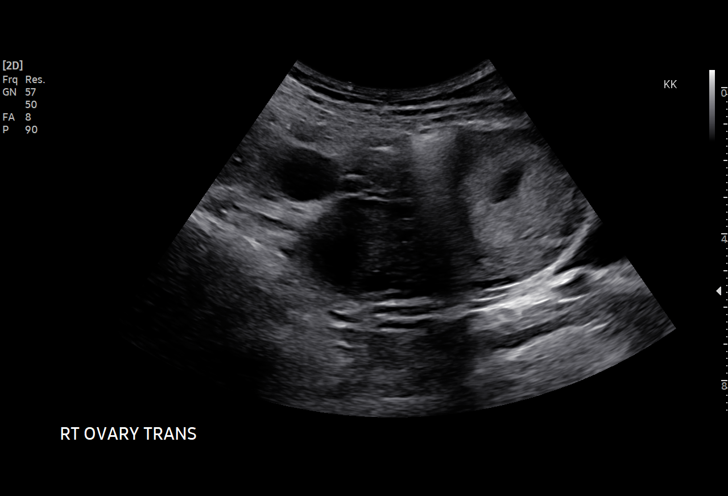
[im 41/45]
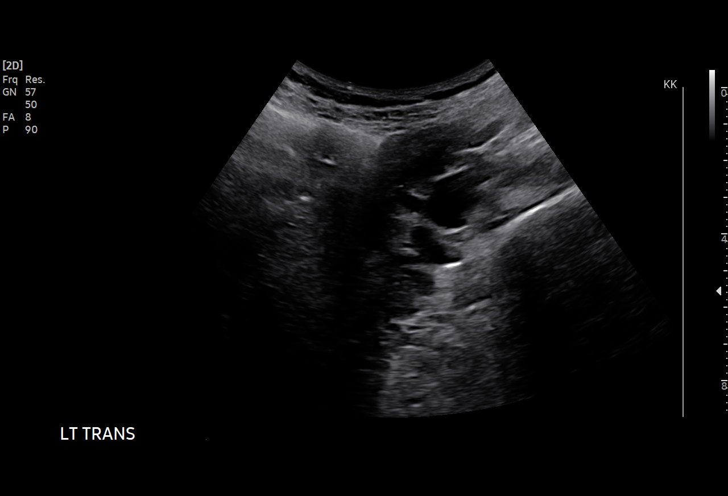
[im 45/45]
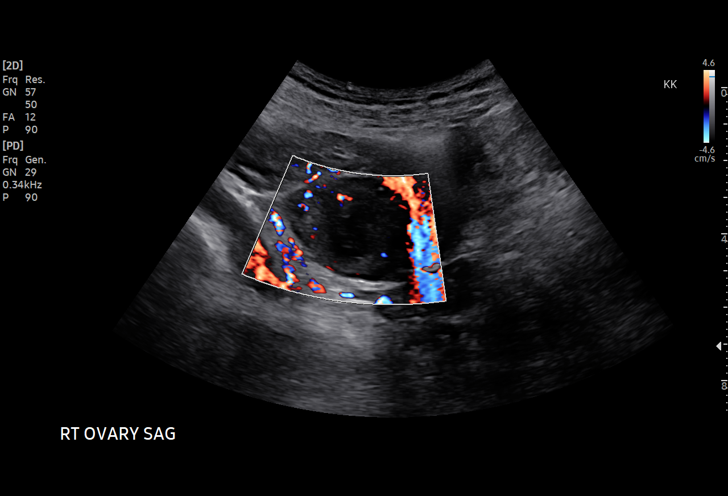

[15 of 28 positions shown; findings below may reference images not displayed]

FINDINGS: Intrauterine gestational sac: Single

Yolk sac:  Visualized.

Embryo:  Visualized.

Cardiac Activity: Visualized.

Heart Rate: 164 bpm

CRL:   14.2 mm   7 w 5 d                  US EDC: 12/24/2020

Subchorionic hemorrhage:  None convincing

Maternal uterus/adnexae: Divergent uterine horns with smooth
appearing fundus with pregnancy in the right horn. The left ovary
was not visualized. Negative right ovary.
IMPRESSION: 1. Single living intrauterine pregnancy measuring 7 weeks 5 days.
2. Divergent uterine horns with pregnancy in the right horn.

## 2020-12-15 NOTE — Telephone Encounter (Signed)
Preadmission screen  

## 2020-12-16 ENCOUNTER — Other Ambulatory Visit: Payer: Self-pay | Admitting: Advanced Practice Midwife

## 2020-12-18 ENCOUNTER — Other Ambulatory Visit: Payer: Self-pay

## 2020-12-18 ENCOUNTER — Ambulatory Visit (INDEPENDENT_AMBULATORY_CARE_PROVIDER_SITE_OTHER): Payer: Managed Care, Other (non HMO) | Admitting: Obstetrics & Gynecology

## 2020-12-18 VITALS — BP 107/67 | HR 90 | Wt 146.0 lb

## 2020-12-18 DIAGNOSIS — Z3A39 39 weeks gestation of pregnancy: Secondary | ICD-10-CM

## 2020-12-18 DIAGNOSIS — O0990 Supervision of high risk pregnancy, unspecified, unspecified trimester: Secondary | ICD-10-CM | POA: Diagnosis not present

## 2020-12-18 NOTE — Progress Notes (Signed)
   PRENATAL VISIT NOTE  Subjective:  Cassie Petersen is a 35 y.o. [phone] at [redacted]w[redacted]d being seen today for ongoing prenatal care.  She is currently monitored for the following issues for this low-risk pregnancy and has Bicornuate uterus; Abnormal Pap smear of cervix; Supervision of high risk pregnancy, antepartum; PAI-1 4G/4G genotype; and History of vitamin D deficiency on their problem list.  Patient reports no complaints.  Contractions: Not present. Vag. Bleeding: None.  Movement: Present. Denies leaking of fluid.   The following portions of the patient's history were reviewed and updated as appropriate: allergies, current medications, past family history, past medical history, past social history, past surgical history and problem list.   Objective:   Vitals:   12/18/20 1407  BP: 107/67  Pulse: 90  Weight: 146 lb (66.2 kg)    Fetal Status: Fetal Heart Rate (bpm): 135   Movement: Present     General:  Alert, oriented and cooperative. Patient is in no acute distress.  Skin: Skin is warm and dry. No rash noted.   Cardiovascular: Normal heart rate noted  Respiratory: Normal respiratory effort, no problems with respiration noted  Abdomen: Soft, gravid, appropriate for gestational age.  Pain/Pressure: Absent     Pelvic: Cervical exam deferred        Extremities: Normal range of motion.  Edema: None  Mental Status: Normal mood and affect. Normal behavior. Normal judgment and thought content.   Assessment and Plan:  Pregnancy: G4P1021 at [redacted]w[redacted]d 1. Supervision of high risk pregnancy, antepartum Baby moving but less. NST Baseline:  130 Accelerations: present Decelerations:  absent Variability: moderate Interpretation:  Reactive NST  Baby has always moved less than her first child.  She is much busier and less stressed this pregnancy and thinks that is why she is noticing hte baby move as much. Prefers not be induced; will do NST x2 and AFI next week.   Declines cervical exam.  Term  labor symptoms and general obstetric precautions including but not limited to vaginal bleeding, contractions, leaking of fluid and fetal movement were reviewed in detail with the patient. Please refer to After Visit Summary for other counseling recommendations.   No follow-ups on file.  Future Appointments  Date Time Provider Department Center  12/20/2020 10:10 AM [facility] None  12/22/2020  9:15 AM [facility] SCHED ROOM [facility] None    [name], MD

## 2020-12-20 ENCOUNTER — Inpatient Hospital Stay (HOSPITAL_COMMUNITY): Payer: Managed Care, Other (non HMO) | Admitting: Anesthesiology

## 2020-12-20 ENCOUNTER — Inpatient Hospital Stay (HOSPITAL_COMMUNITY)
Admission: AD | Admit: 2020-12-20 | Discharge: 2020-12-21 | DRG: 798 | Disposition: A | Discharge status: Home / Self Care | Payer: Managed Care, Other (non HMO) | Attending: Obstetrics & Gynecology | Admitting: Obstetrics & Gynecology

## 2020-12-20 ENCOUNTER — Other Ambulatory Visit: Payer: Self-pay

## 2020-12-20 ENCOUNTER — Encounter (HOSPITAL_COMMUNITY): Payer: Self-pay | Admitting: Obstetrics and Gynecology

## 2020-12-20 ENCOUNTER — Other Ambulatory Visit (HOSPITAL_COMMUNITY): Payer: Managed Care, Other (non HMO)

## 2020-12-20 DIAGNOSIS — O3403 Maternal care for unspecified congenital malformation of uterus, third trimester: Secondary | ICD-10-CM | POA: Diagnosis present

## 2020-12-20 DIAGNOSIS — Z20822 Contact with and (suspected) exposure to covid-19: Secondary | ICD-10-CM | POA: Diagnosis present

## 2020-12-20 DIAGNOSIS — Z719 Counseling, unspecified: Secondary | ICD-10-CM | POA: Diagnosis present

## 2020-12-20 DIAGNOSIS — O0990 Supervision of high risk pregnancy, unspecified, unspecified trimester: Secondary | ICD-10-CM

## 2020-12-20 DIAGNOSIS — Q513 Bicornate uterus: Secondary | ICD-10-CM

## 2020-12-20 DIAGNOSIS — O26893 Other specified pregnancy related conditions, third trimester: Secondary | ICD-10-CM | POA: Diagnosis present

## 2020-12-20 DIAGNOSIS — Z3A39 39 weeks gestation of pregnancy: Secondary | ICD-10-CM | POA: Diagnosis not present

## 2020-12-20 DIAGNOSIS — Z3490 Encounter for supervision of normal pregnancy, unspecified, unspecified trimester: Secondary | ICD-10-CM | POA: Diagnosis present

## 2020-12-20 DIAGNOSIS — R87619 Unspecified abnormal cytological findings in specimens from cervix uteri: Secondary | ICD-10-CM | POA: Diagnosis present

## 2020-12-20 DIAGNOSIS — Z1589 Genetic susceptibility to other disease: Secondary | ICD-10-CM

## 2020-12-20 LAB — RPR: RPR Ser Ql: NONREACTIVE

## 2020-12-20 LAB — CBC
HCT: 38.7 % (ref 36.0–46.0)
Hemoglobin: 13.3 g/dL (ref 12.0–15.0)
MCH: 31.5 pg (ref 26.0–34.0)
MCHC: 34.4 g/dL (ref 30.0–36.0)
MCV: 91.7 fL (ref 80.0–100.0)
Platelets: 157 10*3/uL (ref 150–400)
RBC: 4.22 MIL/uL (ref 3.87–5.11)
RDW: 13.4 % (ref 11.5–15.5)
WBC: 13.9 10*3/uL — ABNORMAL HIGH (ref 4.0–10.5)
nRBC: 0 % (ref 0.0–0.2)

## 2020-12-20 LAB — POCT FERN TEST: POCT Fern Test: POSITIVE

## 2020-12-20 LAB — TYPE AND SCREEN
ABO/RH(D): O POS
Antibody Screen: NEGATIVE

## 2020-12-20 LAB — RESP PANEL BY RT-PCR (FLU A&B, COVID) ARPGX2
Influenza A by PCR: NEGATIVE
Influenza B by PCR: NEGATIVE
SARS Coronavirus 2 by RT PCR: NEGATIVE

## 2020-12-20 MED ORDER — FENTANYL CITRATE (PF) 100 MCG/2ML IJ SOLN
INTRAMUSCULAR | Status: DC | PRN
  Administered 2020-12-20: 100 ug via EPIDURAL

## 2020-12-20 MED ORDER — ONDANSETRON HCL 4 MG PO TABS: 4.0000 mg | ORAL_TABLET | ORAL | Status: DC | PRN

## 2020-12-20 MED ORDER — LACTATED RINGERS AMNIOINFUSION: INTRAVENOUS | Status: DC

## 2020-12-20 MED ORDER — MEASLES, MUMPS & RUBELLA VAC IJ SOLR: 0.5000 mL | Freq: Once | INTRAMUSCULAR | Status: DC

## 2020-12-20 MED ORDER — COCONUT OIL OIL: 1.0000 "application " | TOPICAL_OIL | Status: DC | PRN

## 2020-12-20 MED ORDER — LACTATED RINGERS IV SOLN: 500.0000 mL | Freq: Once | INTRAVENOUS | Status: DC

## 2020-12-20 MED ORDER — TERBUTALINE SULFATE 1 MG/ML IJ SOLN
0.2500 mg | Freq: Once | INTRAMUSCULAR | Status: DC | PRN
Start: 2020-12-20 — End: 2020-12-20

## 2020-12-20 MED ORDER — ONDANSETRON HCL 4 MG/2ML IJ SOLN: 4.0000 mg | Freq: Four times a day (QID) | INTRAMUSCULAR | Status: DC | PRN

## 2020-12-20 MED ORDER — SENNOSIDES-DOCUSATE SODIUM 8.6-50 MG PO TABS
2.0000 | ORAL_TABLET | ORAL | Status: DC
  Administered 2020-12-20: 2 via ORAL
  Filled 2020-12-20: qty 2

## 2020-12-20 MED ORDER — OXYTOCIN BOLUS FROM INFUSION: 333.0000 mL | Freq: Once | INTRAVENOUS | Status: DC

## 2020-12-20 MED ORDER — TETANUS-DIPHTH-ACELL PERTUSSIS 5-2.5-18.5 LF-MCG/0.5 IM SUSY
0.5000 mL | PREFILLED_SYRINGE | Freq: Once | INTRAMUSCULAR | Status: DC
Start: 2020-12-21 — End: 2020-12-21

## 2020-12-20 MED ORDER — LACTATED RINGERS IV SOLN
500.0000 mL | Freq: Once | INTRAVENOUS | Status: AC
  Administered 2020-12-20: 500 mL via INTRAVENOUS

## 2020-12-20 MED ORDER — EPHEDRINE 5 MG/ML INJ: 10.0000 mg | INTRAVENOUS | Status: DC | PRN

## 2020-12-20 MED ORDER — CEFAZOLIN SODIUM-DEXTROSE 2-4 GM/100ML-% IV SOLN: 2.0000 g | Freq: Once | INTRAVENOUS | Status: DC

## 2020-12-20 MED ORDER — OXYCODONE HCL 5 MG PO TABS: 5.0000 mg | ORAL_TABLET | ORAL | Status: DC | PRN

## 2020-12-20 MED ORDER — BENZOCAINE-MENTHOL 20-0.5 % EX AERO: 1.0000 "application " | INHALATION_SPRAY | CUTANEOUS | Status: DC | PRN

## 2020-12-20 MED ORDER — MISOPROSTOL 25 MCG QUARTER TABLET: 25.0000 ug | ORAL_TABLET | ORAL | Status: DC | PRN

## 2020-12-20 MED ORDER — ACETAMINOPHEN 325 MG PO TABS
650.0000 mg | ORAL_TABLET | ORAL | Status: DC | PRN
  Administered 2020-12-20: 650 mg via ORAL
  Filled 2020-12-20: qty 2

## 2020-12-20 MED ORDER — PRENATAL MULTIVITAMIN CH
1.0000 | ORAL_TABLET | Freq: Every day | ORAL | Status: DC
  Administered 2020-12-21: 1 via ORAL
  Filled 2020-12-20: qty 1

## 2020-12-20 MED ORDER — DIPHENHYDRAMINE HCL 25 MG PO CAPS
25.0000 mg | ORAL_CAPSULE | Freq: Four times a day (QID) | ORAL | Status: DC | PRN
Start: 2020-12-20 — End: 2020-12-21

## 2020-12-20 MED ORDER — LIDOCAINE HCL (PF) 1 % IJ SOLN
INTRAMUSCULAR | Status: DC | PRN
  Administered 2020-12-20: 3 mL via EPIDURAL
  Administered 2020-12-20: 5 mL via EPIDURAL
  Administered 2020-12-20: 2 mL via EPIDURAL

## 2020-12-20 MED ORDER — LIDOCAINE HCL (PF) 1 % IJ SOLN
30.0000 mL | INTRAMUSCULAR | Status: AC | PRN
  Administered 2020-12-20: 30 mL via SUBCUTANEOUS
  Filled 2020-12-20: qty 30

## 2020-12-20 MED ORDER — PHENYLEPHRINE 40 MCG/ML (10ML) SYRINGE FOR IV PUSH (FOR BLOOD PRESSURE SUPPORT)
80.0000 ug | PREFILLED_SYRINGE | INTRAVENOUS | Status: DC | PRN
  Filled 2020-12-20: qty 10

## 2020-12-20 MED ORDER — ACETAMINOPHEN 325 MG PO TABS
650.0000 mg | ORAL_TABLET | ORAL | Status: DC | PRN
Start: 2020-12-20 — End: 2020-12-20

## 2020-12-20 MED ORDER — DIBUCAINE (PERIANAL) 1 % EX OINT: 1.0000 "application " | TOPICAL_OINTMENT | CUTANEOUS | Status: DC | PRN

## 2020-12-20 MED ORDER — SOD CITRATE-CITRIC ACID 500-334 MG/5ML PO SOLN: 30.0000 mL | ORAL | Status: DC | PRN

## 2020-12-20 MED ORDER — FENTANYL-BUPIVACAINE-NACL 0.5-0.125-0.9 MG/250ML-% EP SOLN
12.0000 mL/h | EPIDURAL | Status: DC | PRN
  Administered 2020-12-20: 12 mL/h via EPIDURAL

## 2020-12-20 MED ORDER — BUPIVACAINE HCL (PF) 0.25 % IJ SOLN
INTRAMUSCULAR | Status: DC | PRN
  Administered 2020-12-20: 5 mL via EPIDURAL

## 2020-12-20 MED ORDER — METHYLERGONOVINE MALEATE 0.2 MG/ML IJ SOLN: 0.2000 mg | Freq: Once | INTRAMUSCULAR | Status: DC

## 2020-12-20 MED ORDER — WITCH HAZEL-GLYCERIN EX PADS: 1.0000 "application " | MEDICATED_PAD | CUTANEOUS | Status: DC | PRN

## 2020-12-20 MED ORDER — OXYTOCIN-SODIUM CHLORIDE 30-0.9 UT/500ML-% IV SOLN
2.5000 [IU]/h | INTRAVENOUS | Status: DC
Start: 2020-12-20 — End: 2020-12-20
  Filled 2020-12-20: qty 500

## 2020-12-20 MED ORDER — LACTATED RINGERS IV SOLN
INTRAVENOUS | Status: DC
Start: 2020-12-20 — End: 2020-12-20

## 2020-12-20 MED ORDER — LACTATED RINGERS IV SOLN
500.0000 mL | INTRAVENOUS | Status: DC | PRN
  Administered 2020-12-20: 1000 mL via INTRAVENOUS

## 2020-12-20 MED ORDER — MISOPROSTOL 50MCG HALF TABLET
50.0000 ug | ORAL_TABLET | ORAL | Status: DC | PRN
  Administered 2020-12-20: 50 ug via ORAL
  Filled 2020-12-20: qty 1

## 2020-12-20 MED ORDER — OXYCODONE-ACETAMINOPHEN 5-325 MG PO TABS: 1.0000 | ORAL_TABLET | ORAL | Status: DC | PRN

## 2020-12-20 MED ORDER — FENTANYL-BUPIVACAINE-NACL 0.5-0.125-0.9 MG/250ML-% EP SOLN
EPIDURAL | Status: AC
  Filled 2020-12-20: qty 250

## 2020-12-20 MED ORDER — PHENYLEPHRINE 40 MCG/ML (10ML) SYRINGE FOR IV PUSH (FOR BLOOD PRESSURE SUPPORT): 80.0000 ug | PREFILLED_SYRINGE | INTRAVENOUS | Status: DC | PRN

## 2020-12-20 MED ORDER — IBUPROFEN 600 MG PO TABS
600.0000 mg | ORAL_TABLET | Freq: Four times a day (QID) | ORAL | Status: DC
Start: 2020-12-20 — End: 2020-12-21
  Administered 2020-12-20 – 2020-12-21 (×3): 600 mg via ORAL
  Filled 2020-12-20 (×3): qty 1

## 2020-12-20 MED ORDER — CEFAZOLIN SODIUM-DEXTROSE 2-4 GM/100ML-% IV SOLN
2.0000 g | Freq: Once | INTRAVENOUS | Status: AC
  Administered 2020-12-20: 2 g via INTRAVENOUS
  Filled 2020-12-20: qty 100

## 2020-12-20 MED ORDER — OXYCODONE-ACETAMINOPHEN 5-325 MG PO TABS: 2.0000 | ORAL_TABLET | ORAL | Status: DC | PRN

## 2020-12-20 MED ORDER — FENTANYL CITRATE (PF) 100 MCG/2ML IJ SOLN
100.0000 ug | INTRAMUSCULAR | Status: DC | PRN
  Filled 2020-12-20: qty 2

## 2020-12-20 MED ORDER — LIDOCAINE-EPINEPHRINE (PF) 2 %-1:200000 IJ SOLN
INTRAMUSCULAR | Status: DC | PRN
  Administered 2020-12-20: 3 mL via EPIDURAL

## 2020-12-20 MED ORDER — DIPHENHYDRAMINE HCL 50 MG/ML IJ SOLN
12.5000 mg | INTRAMUSCULAR | Status: DC | PRN
Start: 2020-12-20 — End: 2020-12-20

## 2020-12-20 MED ORDER — SIMETHICONE 80 MG PO CHEW: 80.0000 mg | CHEWABLE_TABLET | ORAL | Status: DC | PRN

## 2020-12-20 MED ORDER — ONDANSETRON HCL 4 MG/2ML IJ SOLN: 4.0000 mg | INTRAMUSCULAR | Status: DC | PRN

## 2020-12-20 NOTE — Progress Notes (Addendum)
Unable to urinate when brought to bathroom, 525 mL on Bladder scanner, refused in and out cath, brought back to bathroom, urinated , however possibly the cervix slipped down. Marvell Fuller RN present and assessed patient after getting patient back in bed and cervix returned to normal position.

## 2020-12-20 NOTE — MAU Note (Signed)
Pt presents to MAU c/o poss ROM at around 0115 this morning (clear fluid).  Pt endorses +fm, but states movement has been decreased overall since ROM.  Denies any vaginal bleeding, CTX  Vitals: BP: 116/70 HR: 107 SpO2: 98 Temp: 98.2 RR: 14 Pain: 0/10

## 2020-12-20 NOTE — Discharge Summary (Addendum)
Postpartum Discharge Summary  Date of Service updated: 12/21/2020     Patient Name: Cassie Petersen DOB: Sep 05, 1985 MRN: [id number]  Date of admission: 12/20/2020 Delivery date:12/20/2020  Delivering provider: Julianne Handler  Date of discharge: 12/21/2020  Admitting diagnosis: Encounter for elective induction of labor [Z34.90] Intrauterine pregnancy: [redacted]w[redacted]d    Secondary diagnosis:  Active Problems:   Bicornuate uterus   Abnormal Pap smear of cervix   Supervision of high risk pregnancy, antepartum   PAI-1 4G/4G genotype   Encounter for elective induction of labor   SVD (spontaneous vaginal delivery)   Retained placenta  Additional problems: none    Discharge diagnosis: Term Pregnancy Delivered                                              Post partum procedures:curettage  Augmentation: Cytotec Complications: None  Hospital course: Onset of Labor With Vaginal Delivery      35y.o. yo [id number] admitted in Latent Labor on 12/20/2020. Patient had an labor course as follows:  Membrane Rupture Time/Date: 1:15 AM ,12/20/2020   Delivery Method:Vaginal, Spontaneous  Episiotomy: None  Lacerations:  Labial;Vaginal  Retained placenta with manual extraction. S/P methergine and ancef. Patient had an uncomplicated postpartum course.  She is ambulating, tolerating a regular diet, passing flatus, and urinating well. Patient is discharged home in stable condition on 12/21/20.  Newborn Data: Birth date:12/20/2020  Birth time:2:05 PM  Gender:Female  Living status:Living  Apgars:9 ,9 N/A Weight:3419 g N/A  Magnesium Sulfate received: No BMZ received: No Rhophylac:N/A MMR:N/A T-DaP:Given prenatally Flu: Yes Transfusion:No  Physical exam  Vitals:   12/20/20 1730 12/20/20 1828 12/20/20 2230 12/21/20 0630  BP: 117/75 113/74 104/69 100/68  Pulse: 87 82 80 78  Resp: 18 18 17 17   Temp: 98.6 F (37 C) 98.2 F (36.8 C) 98.8 F (37.1 C) 98.9 F (37.2 C)  TempSrc: Oral  Oral Oral Oral  SpO2: 99% 100% 97% 97%  Weight:      Height:       General: alert, cooperative, and no distress Lochia: appropriate Uterine Fundus: firm Incision: N/A DVT Evaluation: No evidence of DVT seen on physical exam. Negative Homan's sign. No cords or calf tenderness. Labs: Lab Results  Component Value Date   WBC 13.9 (H) 12/20/2020   HGB 13.3 12/20/2020   HCT 38.7 12/20/2020   MCV 91.7 12/20/2020   PLT 157 12/20/2020   No flowsheet data found. Edinburgh Score: Edinburgh Postnatal Depression Scale Screening Tool 12/20/2020  I have been able to laugh and see the funny side of things. (No Data)  I have looked forward with enjoyment to things. -  I have blamed myself unnecessarily when things went wrong. -  I have been anxious or worried for no good reason. -  I have felt scared or panicky for no good reason. -  Things have been getting on top of me. -  I have been so unhappy that I have had difficulty sleeping. -  I have felt sad or miserable. -  I have been so unhappy that I have been crying. -  The thought of harming myself has occurred to me. - Flavia Shipperostnatal Depression Scale Total -     After visit meds:  Allergies as of 12/21/2020   No Known Allergies      Medication  List     TAKE these medications    acetaminophen 325 MG tablet Commonly known as: Tylenol Take 2 tablets (650 mg total) by mouth every 4 (four) hours as needed (for pain scale < 4).   benzocaine-Menthol 20-0.5 % Aero Commonly known as: DERMOPLAST Apply 1 application topically as needed for irritation (perineal discomfort).   coconut oil Oil Apply 1 application topically as needed.   ibuprofen 600 MG tablet Commonly known as: ADVIL Take 1 tablet (600 mg total) by mouth every 6 (six) hours.   prenatal multivitamin Tabs tablet Take 1 tablet by mouth daily at 12 noon.   traMADol 50 MG tablet Commonly known as: Ultram Take 1 tablet (50 mg total) by mouth every 6 (six) hours  as needed.         Discharge home in stable condition Infant Feeding: Breast Infant Disposition:home with mother Discharge instruction: per After Visit Summary and Postpartum booklet. Activity: Advance as tolerated. Pelvic rest for 6 weeks.  Diet: routine diet Future Appointments: Future Appointments  Date Time Provider [place]  12/25/2020  2:15 PM Anyanwu, Sallyanne Havers, MD [facility]  12/28/2020 10:00 AM [place] [facility]   Follow up Visit:   Please schedule this patient for a In person postpartum visit in 6 weeks with the following provider: Any provider. Additional Postpartum F/U:BP check 1 week and     Low risk pregnancy complicated by:  none Delivery mode:  Vaginal, Spontaneous  Anticipated Birth Control:  POPs, rx sent to pharmacy at discharge.   12/21/2020 Erskine Emery, MD  GME ATTESTATION:  I saw and evaluated the patient. I agree with the findings and the plan of care as documented in the resident's note.  Arrie Senate, MD OB Fellow, [place] for [place] 12/21/2020 9:05 AM

## 2020-12-20 NOTE — Anesthesia Procedure Notes (Signed)
Epidural Patient location during procedure: OB Start time: 12/20/2020 8:04 AM End time: 12/20/2020 8:11 AM  Staffing Anesthesiologist: Cecile Hearing, MD Performed: anesthesiologist   Preanesthetic Checklist Completed: patient identified, IV checked, risks and benefits discussed, monitors and equipment checked, pre-op evaluation and timeout performed  Epidural Patient position: sitting Prep: DuraPrep Patient monitoring: blood pressure and continuous pulse ox Approach: midline Location: L3-L4 Injection technique: LOR air  Needle:  Needle type: Tuohy  Needle gauge: 17 G Needle length: 9 cm Catheter size: 19 Gauge Test dose: negative and Other (1% Lidocaine)  Additional Notes Patient identified.  Risk benefits discussed including failed block, incomplete pain control, headache, nerve damage, paralysis, blood pressure changes, nausea, vomiting, reactions to medication both toxic or allergic, and postpartum back pain.  Patient expressed understanding and wished to proceed.  All questions were answered.  Sterile technique used throughout procedure and epidural site dressed with sterile barrier dressing. No paresthesia or other complications noted. The patient did not experience any signs of intravascular injection such as tinnitus or metallic taste in mouth nor signs of intrathecal spread such as rapid motor block. Please see nursing notes for vital signs. Reason for block:procedure for pain

## 2020-12-20 NOTE — H&P (Addendum)
OBSTETRIC ADMISSION HISTORY AND PHYSICAL  Cassie Petersen is a 35 y.o. female [phone] with IUP at [redacted]w[redacted]d by [redacted]w[redacted]d U/S presenting for ROM with clear fluid @0115  this morning. She reports +FMs, no VB, no blurry vision, headaches or peripheral edema, or RUQ pain. She plans on both breast feeding. She is unsure about birth control.  She [facility] her [facility] care at  [facility]    Dating: By [redacted]w[redacted]d U/S --->  Estimated Date of Delivery: 12/24/20  Sono:   11/06/20@[redacted]w[redacted]d , CWD, normal anatomy, cephalic presentation, anterior placental lie, 2389g, 76% EFW  Prenatal History/Complications:  Bicornuate uterus PAI-1 mutation (was on lovenox up to 36wk-does not need pp)  PSHx: Appendectomy   Past Medical History: Past Medical History:  Diagnosis Date   Bicornuate uterus    HPV in female    PONV (postoperative nausea and vomiting)    Vaginal Pap smear, abnormal    ASCUS    Past Surgical History: Past Surgical History:  Procedure Laterality Date   APPENDECTOMY     BREAST ENHANCEMENT SURGERY     BREAST SURGERY Bilateral    DILATION AND EVACUATION N/A 11/17/2019   Procedure: DILATATION AND EVACUATION;  Surgeon: 11/19/2019, MD;  Location: [facility] OR;  Service: Gynecology;  Laterality: N/A;   MULTIPLE TOOTH EXTRACTIONS      Obstetrical History: OB History     Gravida  4   Para  1   Term  1   Preterm  0   AB  2   Living  1      SAB  2   IAB  0   Ectopic  0   Multiple      Live Births  1           Social History Social History   Socioeconomic History   Marital status: Married    Spouse name: Not on file   Number of children: 1   Years of education: Not on file   Highest education level: Not on file  Occupational History   Not on file  Tobacco Use   Smoking status: Never   Smokeless tobacco: Never  Vaping Use   Vaping Use: Never used  Substance and Sexual Activity   Alcohol use: Not Currently    Comment: social   Drug use: Never   Sexual activity: Yes    Birth  control/protection: None  Other Topics Concern   Not on file  Social History Narrative   ** Merged History Encounter **       Social Determinants of Health   Financial Resource Strain: Not on file  Food Insecurity: Not on file  Transportation Needs: Not on file  Physical Activity: Not on file  Stress: Not on file  Social Connections: Not on file    Family History: Family History  Problem Relation Age of Onset   Vision loss Mother    Breast cancer Paternal Grandmother 28   Healthy Father    Healthy Son     Allergies: No Known Allergies  Medications Prior to Admission  Medication Sig Dispense Refill Last Dose   Prenatal Vit-Fe Fumarate-FA (PRENATAL MULTIVITAMIN) TABS tablet Take 1 tablet by mouth daily at 12 noon.   12/19/2020     Review of Systems   All systems reviewed and negative except as stated in HPI  Blood pressure 117/71, pulse 100, temperature (!) 97.2 F (36.2 C), temperature source Axillary, resp. rate 16, height 5\' 8"  (1.727 m), weight 66.7 kg, last menstrual period  02/23/2020, SpO2 98 %. General appearance: alert, cooperative, and appears stated age Lungs: clear to auscultation bilaterally Heart: regular rate and rhythm Abdomen: soft, non-tender; bowel sounds normal Pelvic: As stated below Extremities: Homans sign is negative, no sign of DVT Presentation: cephalic by cervical exam Fetal monitoringBaseline: 130 bpm, Variability: Good {> 6 bpm), Accelerations: Reactive, and Decelerations: Absent Uterine activity: contractions q4-77min Dilation: 1.5 Effacement (%): 50 Station: -2 Exam by:: Dr. Germaine Pomfret  Prenatal labs: ABO, Rh: --/--/O POS (07/20 6295) Antibody: NEG (07/20 0450) Rubella: 4.50 (01/10 1532) RPR: NON-REACTIVE (05/04 0000)  HBsAg: NON-REACTIVE (01/10 1532)  HIV: NON-REACTIVE (05/04 0000)  GBS:   neg 2 hr Glucola passed Genetic screening  nml Anatomy US nml  Prenatal Transfer Tool  Maternal Diabetes: No Genetic Screening:  Normal Maternal Ultrasounds/Referrals: Normal Fetal Ultrasounds or other Referrals:  None Maternal Substance Abuse:  No Significant Maternal Medications:  None Significant Maternal Lab Results: Group B Strep negative  Results for orders placed or performed during the hospital encounter of 12/20/20 (from the past 24 hour(s))  POCT fern test   Collection Time: 12/20/20  4:27 AM  Result Value Ref Range   POCT Fern Test Positive = ruptured amniotic membanes   CBC   Collection Time: 12/20/20  4:36 AM  Result Value Ref Range   WBC 13.9 (H) 4.0 - 10.5 K/uL   RBC 4.22 3.87 - 5.11 MIL/uL   Hemoglobin 13.3 12.0 - 15.0 g/dL   HCT 28.4 13.2 - 44.0 %   MCV 91.7 80.0 - 100.0 fL   MCH 31.5 26.0 - 34.0 pg   MCHC 34.4 30.0 - 36.0 g/dL   RDW 10.2 72.5 - 36.6 %   Platelets 157 150 - 400 K/uL   nRBC 0.0 0.0 - 0.2 %  Type and screen   Collection Time: 12/20/20  4:50 AM  Result Value Ref Range   ABO/RH(D) O POS    Antibody Screen NEG    Sample Expiration      12/23/2020,2359 Performed at [facility], [address], [place], [place] [address]     Patient Active Problem List   Diagnosis Date Noted   Encounter for elective induction of labor 12/20/2020   History of vitamin D deficiency 06/14/2020   Supervision of high risk pregnancy, antepartum 06/12/2020   PAI-1 4G/4G genotype 06/12/2020   Abnormal Pap smear of cervix 12/02/2019   Bicornuate uterus     Assessment/Plan:  Cassie Petersen is a 35 y.o. [id number] at [redacted]w[redacted]d here for ROM@0115 .  #ROM: Given cervical exam will dose cytotec x1-ordered. Discussed IOL process with patient, patient comfortable and not in labor currently. #Pain: Prn-wants epidural #FWB: Cat 1 #ID: GBS neg #MOF: breast #MOC: unsure #Circ:  Yes #PAI-1 mutation (was on lovenox up to 36wk-does not need pp)  Alfredo Martinez, MD  12/20/2020, 6:20 AM  GME ATTESTATION:  I saw and evaluated the patient. I agree with the findings and the plan of care as documented  in the resident's note.  Alric Seton, MD OB Fellow, Faculty [facility], Center for [facility] 12/20/2020 6:48 AM

## 2020-12-20 NOTE — Anesthesia Preprocedure Evaluation (Signed)
Anesthesia Evaluation  Patient identified by MRN, date of birth, ID band Patient awake    Reviewed: Allergy & Precautions, NPO status , Patient's Chart, lab work & pertinent test results  History of Anesthesia Complications (+) PONV and history of anesthetic complications  Airway Mallampati: I  TM Distance: >3 FB Neck ROM: Full    Dental  (+) Teeth Intact, Dental Advisory Given   Pulmonary neg pulmonary ROS,    Pulmonary exam normal breath sounds clear to auscultation       Cardiovascular negative cardio ROS Normal cardiovascular exam Rhythm:Regular Rate:Normal     Neuro/Psych negative neurological ROS     GI/Hepatic negative GI ROS, Neg liver ROS,   Endo/Other  negative endocrine ROS  Renal/GU negative Renal ROS     Musculoskeletal negative musculoskeletal ROS (+)   Abdominal   Peds  Hematology Plt 157k   Anesthesia Other Findings Day of surgery medications reviewed with the patient.  Reproductive/Obstetrics (+) Pregnancy                             Anesthesia Physical Anesthesia Plan  ASA: 2  Anesthesia Plan: Epidural   Post-op Pain Management:    Induction:   PONV Risk Score and Plan: 3 and Treatment may vary due to age or medical condition  Airway Management Planned: Natural Airway  Additional Equipment:   Intra-op Plan:   Post-operative Plan:   Informed Consent: I have reviewed the patients History and Physical, chart, labs and discussed the procedure including the risks, benefits and alternatives for the proposed anesthesia with the patient or authorized representative who has indicated his/her understanding and acceptance.     Dental advisory given  Plan Discussed with:   Anesthesia Plan Comments: (Patient identified. Risks/Benefits/Options discussed with patient including but not limited to bleeding, infection, nerve damage, paralysis, failed block, incomplete  pain control, headache, blood pressure changes, nausea, vomiting, reactions to medication both or allergic, itching and postpartum back pain. Confirmed with bedside nurse the patient's most recent platelet count. Confirmed with patient that they are not currently taking any anticoagulation, have any bleeding history or any family history of bleeding disorders. Patient expressed understanding and wished to proceed. All questions were answered. )        Anesthesia Quick Evaluation

## 2020-12-20 NOTE — Progress Notes (Signed)
Labor Progress Note Cassie Petersen is a 35 y.o. [phone] at [redacted]w[redacted]d presented for PROM at term.   S:  Comfortable with epidural. No c/o.  O:  BP 121/73   Pulse (!) 106   Temp 98.4 F (36.9 C) (Axillary)   Resp 15   Ht 5\' 8"  (1.727 m)   Wt 66.7 kg   LMP 02/23/2020   SpO2 98%   BMI 22.35 kg/m  EFM: baseline 115 bpm/ mod variability/ no accels/ variable decels  Toco/IUPC: irregular SVE: Dilation: 6 Effacement (%): 90 Cervical Position: Middle Station: Plus 1, Plus 2 Presentation: Vertex Exam by:: [email or web address] RN   A/P: 35 y.o. 31 [redacted]w[redacted]d  1. Labor: early active 2. FWB: Cat II 3. Pain: epidural   S/p 1 dose of Cytotec. IUPC placed and will start amnioinfusion for persistent variables along with maternal repositioning. Anticipate labor progress and SVD. Dr. [redacted]w[redacted]d and Dr. Charlotta Newton updated.   Adrian Blackwater, CNM 11:34 AM

## 2020-12-20 NOTE — Plan of Care (Signed)
  Problem: Education: Goal: Knowledge of Childbirth will improve Outcome: Progressing Goal: Ability to make informed decisions regarding treatment and plan of care will improve Outcome: Progressing Goal: Ability to state and carry out methods to decrease the pain will improve Outcome: Progressing   

## 2020-12-20 NOTE — Anesthesia Postprocedure Evaluation (Signed)
Anesthesia Post Note  Patient: Cassie Petersen  Procedure(s) Performed: AN AD HOC LABOR EPIDURAL     Patient location during evaluation: Mother Baby Anesthesia Type: Epidural Level of consciousness: awake and alert Pain management: pain level controlled Vital Signs Assessment: post-procedure vital signs reviewed and stable Respiratory status: spontaneous breathing, nonlabored ventilation and respiratory function stable Cardiovascular status: stable Postop Assessment: no headache, no backache and epidural receding Anesthetic complications: no   No notable events documented.  Last Vitals:  Vitals:   12/20/20 1730 12/20/20 1828  BP: 117/75 113/74  Pulse: 87 82  Resp: 18 18  Temp: 37 C 36.8 C  SpO2: 99% 100%    Last Pain:  Vitals:   12/20/20 1915  TempSrc:   PainSc: 0-No pain   Pain Goal:                   Trellis Paganini

## 2020-12-21 MED ORDER — IBUPROFEN 600 MG PO TABS: 600.0000 mg | ORAL_TABLET | Freq: Four times a day (QID) | ORAL | 0 refills | Status: DC

## 2020-12-21 MED ORDER — BENZOCAINE-MENTHOL 20-0.5 % EX AERO: 1.0000 "application " | INHALATION_SPRAY | CUTANEOUS | Status: DC | PRN

## 2020-12-21 MED ORDER — COCONUT OIL OIL: 1.0000 "application " | TOPICAL_OIL | 0 refills | Status: DC | PRN

## 2020-12-21 MED ORDER — ACETAMINOPHEN 325 MG PO TABS: 650.0000 mg | ORAL_TABLET | ORAL | Status: DC | PRN

## 2020-12-21 MED ORDER — TRAMADOL HCL 50 MG PO TABS: 50.0000 mg | ORAL_TABLET | Freq: Four times a day (QID) | ORAL | 0 refills | Status: DC | PRN

## 2020-12-21 MED ORDER — NORETHINDRONE 0.35 MG PO TABS: 1.0000 | ORAL_TABLET | Freq: Every day | ORAL | 11 refills | Status: DC

## 2020-12-21 NOTE — Progress Notes (Signed)
Patient stated that she declined/refused the 0230 check. Patient stated that she wanted to rest and not be disturbed.

## 2020-12-21 NOTE — Progress Notes (Signed)
Pt  refuses fundal and perineal assessment- Dr Charlotta Newton is aware. I reviewed Abnormal S & S of lochia and cramping. Pt states understanding info.

## 2020-12-22 ENCOUNTER — Inpatient Hospital Stay (HOSPITAL_COMMUNITY)
Admission: AD | Admit: 2020-12-22 | Payer: Managed Care, Other (non HMO) | Source: Home / Self Care | Admitting: Family Medicine

## 2020-12-22 ENCOUNTER — Inpatient Hospital Stay (HOSPITAL_COMMUNITY): Payer: Managed Care, Other (non HMO)

## 2020-12-25 ENCOUNTER — Encounter: Payer: Managed Care, Other (non HMO) | Admitting: Obstetrics & Gynecology

## 2020-12-25 LAB — SURGICAL PATHOLOGY

## 2020-12-26 ENCOUNTER — Telehealth: Payer: Self-pay | Admitting: *Deleted

## 2020-12-26 NOTE — Telephone Encounter (Signed)
-----   Message from Alric Seton, MD sent at 12/21/2020  9:05 AM EDT ----- Regarding: postpartum appt Please schedule patient for 1 week BP check. Thanks!  Alric Seton, MD OB Fellow, Faculty [facility], Center for [facility] 12/21/2020 9:05 AM

## 2020-12-26 NOTE — Telephone Encounter (Signed)
Left patient an urgent message to call and schedule 1 week BP check and 6 week Postpartum appointments for delivery date 12/20/2020.

## 2020-12-28 ENCOUNTER — Other Ambulatory Visit: Payer: Self-pay

## 2020-12-28 ENCOUNTER — Encounter: Payer: Self-pay | Admitting: Obstetrics and Gynecology

## 2020-12-28 ENCOUNTER — Ambulatory Visit (INDEPENDENT_AMBULATORY_CARE_PROVIDER_SITE_OTHER): Payer: Managed Care, Other (non HMO) | Admitting: Obstetrics and Gynecology

## 2020-12-28 ENCOUNTER — Other Ambulatory Visit: Payer: Managed Care, Other (non HMO)

## 2020-12-28 NOTE — Progress Notes (Signed)
    Postpartum BP check  Chief Complaint:  Chief Complaint  Patient presents with   BP check PP    History of Present Illness: Cassie Petersen is a 35 y.o. Caucasian [id number] (Patient's last menstrual period was 02/23/2020.), seen for the above chief complaint. 1 week post partum.   Here for BP check. She is feeling well. Still bleeding which is more than she had last pregnancy. Occasional cramping that lasts up to an hour. She is currently pumping but trying to decrease breast milk supply as she does not intent to breastfeed. Having discomfort with engorgement but pumping just for relief. Overall doing well and feeling happy.  Review of Systems: Positive for n/a.   Her 12 point review of systems is negative or as noted in the History of Present Illness.  Patient Active Problem List   Diagnosis Date Noted   Encounter for elective induction of labor 12/20/2020   SVD (spontaneous vaginal delivery) 12/20/2020   Retained placenta 12/20/2020   History of vitamin D deficiency 06/14/2020   Supervision of high risk pregnancy, antepartum 06/12/2020   PAI-1 4G/4G genotype 06/12/2020   Abnormal Pap smear of cervix 12/02/2019   Bicornuate uterus     Medications [name] had no medications administered during this visit. Current Outpatient Medications  Medication Sig Dispense Refill   acetaminophen (TYLENOL) 325 MG tablet Take 2 tablets (650 mg total) by mouth every 4 (four) hours as needed (for pain scale < 4).     benzocaine-Menthol (DERMOPLAST) 20-0.5 % AERO Apply 1 application topically as needed for irritation (perineal discomfort).     ibuprofen (ADVIL) 600 MG tablet Take 1 tablet (600 mg total) by mouth every 6 (six) hours. 30 tablet 0   norethindrone (ORTHO MICRONOR) 0.35 MG tablet Take 1 tablet (0.35 mg total) by mouth daily. 28 tablet 11   Prenatal Vit-Fe Fumarate-FA (PRENATAL MULTIVITAMIN) TABS tablet Take 1 tablet by mouth daily at 12 noon.     No current  facility-administered medications for this visit.    Allergies Patient has no known allergies.  Physical Exam:  BP 106/69   Pulse 85   Resp 16   Ht 5\' 10"  (1.778 m)   Wt 134 lb (60.8 kg)   LMP 02/23/2020   Breastfeeding Yes   BMI 19.23 kg/m  Body mass index is 19.23 kg/m. General appearance: Well nourished, well developed female in no acute distress.  Cardiovascular: regular rate and rhythm Respiratory:   Normal respiratory effort Neuro/Psych:  Normal mood and affect.  Skin:  Warm and dry.    Assessment: Patient is a 35 y.o. 31 who is 1 weeks post partum from a SVD. She is doing well.   Plan:  - Cont pumping for relief from engorgement prn, reviewed supply/demand, as she pumps less, breasts will not continue milk production - BP normotensive - return for routine postpartum visit   K. [id number], MD, [facility] Attending Center for [facility])

## 2020-12-30 ENCOUNTER — Telehealth (HOSPITAL_COMMUNITY): Payer: Self-pay

## 2020-12-30 NOTE — Telephone Encounter (Signed)
"  I'm doing pretty good. I went to my 1 week appointment with my doctor and I'm going again in a few weeks. So I'm doing ok." Patient declines questions or concerns about her healing.  "He is great. He is eating and gaining weight. He sleeps on his back in a bassinet." RN reviewed ABC's of safe sleep. Patient declines questions or concerns about baby.  EPDS score is 2.  Marcelino Duster [facility] 12/30/2020,1254

## 2021-01-12 ENCOUNTER — Telehealth (INDEPENDENT_AMBULATORY_CARE_PROVIDER_SITE_OTHER): Payer: Managed Care, Other (non HMO) | Admitting: Physician Assistant

## 2021-01-12 ENCOUNTER — Encounter: Payer: Self-pay | Admitting: Physician Assistant

## 2021-01-12 VITALS — Ht 70.0 in | Wt 130.0 lb

## 2021-01-12 DIAGNOSIS — R053 Chronic cough: Secondary | ICD-10-CM

## 2021-01-12 MED ORDER — PREDNISONE 20 MG PO TABS: 40.0000 mg | ORAL_TABLET | Freq: Every day | ORAL | 0 refills | Status: DC

## 2021-01-12 NOTE — Progress Notes (Signed)
Virtual Visit via Video   I connected with Cassie Petersen on 01/12/21 at  8:00 AM EDT by a video enabled telemedicine application and verified that I am speaking with the correct person using two identifiers. Location patient: Home Location provider: [facility], Office Persons participating in the virtual visit: [place], [place] PA-C, Corky Mull, LPN   I discussed the limitations of evaluation and management by telemedicine and the availability of in person appointments. The patient expressed understanding and agreed to proceed.  I acted as a Neurosurgeon for [name], [name], LPN   Subjective:   HPI:   Cough Dealt with a cough last fall that required pulmonary follow-up. Had work-up that was overall negative. Suspected reflux and PND contributing. She had a humidifier in her home and stopped using this and that helped her cough for a bit. Didn't have any significant issues at the beginning and middle of this year until she developed a cold in June. Negative COVID tests. Cough persists since then. Has tried claritin. Had baby 3 weeks ago. Denies: nasal congestion, fever, chills, SOB, LE swelling, chest pain, issues with BP.  She is not breastfeeding  ROS: See pertinent positives and negatives per HPI.  Patient Active Problem List   Diagnosis Date Noted   Encounter for elective induction of labor 12/20/2020   SVD (spontaneous vaginal delivery) 12/20/2020   Retained placenta 12/20/2020   History of vitamin D deficiency 06/14/2020   Supervision of high risk pregnancy, antepartum 06/12/2020   PAI-1 4G/4G genotype 06/12/2020   Abnormal Pap smear of cervix 12/02/2019   Bicornuate uterus     Social History   Tobacco Use   Smoking status: Never   Smokeless tobacco: Never  Substance Use Topics   Alcohol use: Not Currently    Comment: social    Current Outpatient Medications:    acetaminophen (TYLENOL) 325 MG tablet, Take 2 tablets (650  mg total) by mouth every 4 (four) hours as needed (for pain scale < 4)., Disp: , Rfl:    norethindrone (ORTHO MICRONOR) 0.35 MG tablet, Take 1 tablet (0.35 mg total) by mouth daily., Disp: 28 tablet, Rfl: 11   predniSONE (DELTASONE) 20 MG tablet, Take 2 tablets (40 mg total) by mouth daily., Disp: 10 tablet, Rfl: 0   Prenatal Vit-Fe Fumarate-FA (PRENATAL MULTIVITAMIN) TABS tablet, Take 1 tablet by mouth daily at 12 noon., Disp: , Rfl:   No Known Allergies  Objective:   VITALS: Per patient if applicable, see vitals. GENERAL: Alert, appears well and in no acute distress. HEENT: Atraumatic, conjunctiva clear, no obvious abnormalities on inspection of external nose and ears. NECK: Normal movements of the head and neck. CARDIOPULMONARY: No increased WOB. Speaking in clear sentences. I:E ratio WNL.  MS: Moves all visible extremities without noticeable abnormality. PSYCH: Pleasant and cooperative, well-groomed. Speech normal rate and rhythm. Affect is appropriate. Insight and judgement are appropriate. Attention is focused, linear, and appropriate.  NEURO: CN grossly intact. Oriented as arrived to appointment on time with no prompting. Moves both UE equally.  SKIN: No obvious lesions, wounds, erythema, or cyanosis noted on face or hands.  Assessment and Plan:   Andraya was seen today for cough.  Diagnoses and all orders for this visit:  Persistent cough  Other orders -     predniSONE (DELTASONE) 20 MG tablet; Take 2 tablets (40 mg total) by mouth daily.  No red flags on discussion. Suspect possible post-viral cough. Start oral prednisone, continue antihistamine,  and had reflux medication. Recommended scheduling appt with pulm in case symptoms do not improve. Worsening precautions advised    I discussed the assessment and treatment plan with the patient. The patient was provided an opportunity to ask questions and all were answered. The patient agreed with the plan and demonstrated an  understanding of the instructions.   The patient was advised to call back or seek an in-person evaluation if the symptoms worsen or if the condition fails to improve as anticipated.   CMA or LPN served as scribe during this visit. History, Physical, and Plan performed by medical provider. The above documentation has been reviewed and is accurate and complete.   [place], [place] 01/12/2021

## 2021-01-15 ENCOUNTER — Ambulatory Visit (INDEPENDENT_AMBULATORY_CARE_PROVIDER_SITE_OTHER): Payer: Managed Care, Other (non HMO)

## 2021-01-15 DIAGNOSIS — R300 Dysuria: Secondary | ICD-10-CM

## 2021-01-15 LAB — POCT URINE QUALITATIVE DIPSTICK BLOOD: Blood, UA: NEGATIVE

## 2021-01-15 NOTE — Progress Notes (Signed)
SUBJECTIVE: Cassie Petersen is a 35 y.o. female who complains of urinary frequency, urgency and dysuria x 2-3 days, without flank pain, fever, chills, or abnormal vaginal discharge or bleeding.   OBJECTIVE: Appears well, in no apparent distress.  Vital signs are normal. Urine dipstick shows done and negative for all components.    ASSESSMENT: Dysuria  PLAN: Treatment per orders.  Will send for urine culture.Call or return to clinic prn if these symptoms worsen or fail to improve as anticipated.

## 2021-01-16 NOTE — Progress Notes (Signed)
A user error has taken place: encounter opened in error, closed for administrative reasons.

## 2021-01-17 LAB — URINE CULTURE: Organism ID, Bacteria: NO GROWTH

## 2021-01-23 ENCOUNTER — Encounter: Payer: Self-pay | Admitting: Plastic Surgery

## 2021-01-23 ENCOUNTER — Ambulatory Visit (INDEPENDENT_AMBULATORY_CARE_PROVIDER_SITE_OTHER): Payer: Self-pay | Admitting: Plastic Surgery

## 2021-01-23 ENCOUNTER — Other Ambulatory Visit: Payer: Self-pay

## 2021-01-23 DIAGNOSIS — Z719 Counseling, unspecified: Secondary | ICD-10-CM

## 2021-01-23 NOTE — Progress Notes (Signed)
Botulinum Toxin Injection Procedure Note  Procedure: Cosmetic botulinum toxin  Pre-operative Diagnosis: Dynamic rhytides   Post-operative Diagnosis: Same  Complications:  None  Brief history: The patient desires botulinum toxin injection of her forehead. I discussed with the patient this proposed procedure of botulinum toxin injections, which is customized depending on the particular needs of the patient. It is performed on facial rhytids as a temporary correction. The alternatives were discussed with the patient. The risks were addressed including bleeding, scarring, infection, damage to deeper structures, asymmetry, and chronic pain, which may occur infrequently after a procedure. The individual's choice to undergo a surgical procedure is based on the comparison of risks to potential benefits. Other risks include unsatisfactory results, brow ptosis, eyelid ptosis, allergic reaction, temporary paralysis, which should go away with time, bruising, blurring disturbances and delayed healing. Botulinum toxin injections do not arrest the aging process or produce permanent tightening of the eyelid.  Operative intervention maybe necessary to maintain the results of a blepharoplasty or botulinum toxin. The patient understands and wishes to proceed.  Procedure: The area was prepped with alcohol and dried with a clean gauze. Using a clean technique, the botulinum toxin was diluted with 1.25 cc of preservative-free normal saline which was slowly injected with an 18 gauge needle in a tuberculin syringes.  A 32 gauge needles were then used to inject the botulinum toxin. This mixture allow for an aliquot of 5 units per 0.1 cc in each injection site.    Subsequently the mixture was injected in the glabellar and forehead area with preservation of the temporal branch to the lateral eyebrow as well as into each lateral canthal area beginning from the lateral orbital rim medial to the zygomaticus major in 3 separate  areas. A total of 44 Units of botulinum toxin was used. The forehead and glabellar area was injected with care to inject intramuscular only while holding pressure on the supratrochlear vessels in each area during each injection on either side of the medial corrugators. The injection proceeded vertically superiorly to the medial 2/3 of the frontalis muscle and superior 2/3 of the lateral frontalis, again with preservation of the frontal branch.  No complications were noted. Light pressure was held for 5 minutes. She was instructed explicitly in post-operative care.  Botox LOT:  [id number] EXP:  6/24

## 2021-01-26 NOTE — Progress Notes (Signed)
    Post Partum Visit Note  Cassie Petersen is a 35 y.o. [phone] female who presents for a postpartum visit. She is 6 weeks postpartum following a normal spontaneous vaginal delivery.  I have fully reviewed the prenatal and intrapartum course. The delivery was at 39.3 gestational weeks.  Anesthesia: epidural. Postpartum course has been unremarkable. Baby is doing well. Baby is feeding by bottle - Hepp . Bleeding no bleeding. Bowel function is normal. Bladder function is normal. Patient is not sexually active. Contraception method is oral progesterone-only contraceptive. Postpartum depression screening: negative.   The pregnancy intention screening data noted above was reviewed. Potential methods of contraception were discussed. The patient elected to proceed with No data recorded.    Health Maintenance Due  Topic Date Due   COVID-19 Vaccine (3 - Booster for Pfizer series) 03/17/2020    The following portions of the patient's history were reviewed and updated as appropriate: allergies, current medications, past family history, past medical history, past social history, past surgical history, and problem list.  Review of Systems Pertinent items noted in HPI and remainder of comprehensive ROS otherwise negative.  Objective:  LMP 02/23/2020    General:  alert, cooperative, and no distress   Breasts:  Declined exam  Lungs: Declined exam  Heart:  Declined exam  Abdomen: Declined exam  Wound Declined exam  GU exam: Declined exam       Assessment:   Nml postpartum exam.   Plan:   Essential components of care per ACOG recommendations:  1.  Mood and well being: Patient with negative depression screening today. Reviewed local resources for support.  - Patient tobacco use? No.   - hx of drug use? No.    2. Infant care and feeding:  -Patient currently breastmilk feeding? no -Social determinants of health (SDOH) reviewed in EPIC. No concerns  3. Sexuality, contraception and birth  spacing - Patient does want a pregnancy in the next year.  Desired family size is 3 children.  - Reviewed forms of contraception in tiered fashion. Patient desired oral contraceptives (estrogen/progesterone) today.   - Discussed birth spacing of 18 months  4. Sleep and fatigue -Encouraged family/partner/community support of 4 hrs of uninterrupted sleep to help with mood and fatigue  5. Physical Recovery  - Discussed patients delivery and complications. She describes her labor as good. - Patient had a Vaginal, no problems at delivery. Patient had a 1st degree laceration. Perineal healing reviewed. Patient expressed understanding - Patient has urinary incontinence? No. - Patient is safe to resume physical and sexual activity  6.  Health Maintenance - HM due items addressed Yes - Last pap smear  Diagnosis  Date Value Ref Range Status  11/29/2019 (A)  Final   - Atypical squamous cells, cannot exclude high grade squamous  11/29/2019 intraepithelial lesion (ASC-H) (A)  Final   Pap smear not done at today's visit. Will do at visit in near future.  -Breast Cancer screening indicated? No.   7. Chronic Disease/Pregnancy Condition follow up: None  [name], MD  [facility], [facility]

## 2021-01-29 ENCOUNTER — Ambulatory Visit (INDEPENDENT_AMBULATORY_CARE_PROVIDER_SITE_OTHER): Payer: Managed Care, Other (non HMO) | Admitting: Obstetrics & Gynecology

## 2021-01-29 ENCOUNTER — Other Ambulatory Visit: Payer: Self-pay

## 2021-01-29 ENCOUNTER — Encounter: Payer: Self-pay | Admitting: Obstetrics & Gynecology

## 2021-01-29 DIAGNOSIS — Q513 Bicornate uterus: Secondary | ICD-10-CM

## 2021-01-29 DIAGNOSIS — D069 Carcinoma in situ of cervix, unspecified: Secondary | ICD-10-CM

## 2021-01-29 MED ORDER — NORGESTIMATE-ETH ESTRADIOL 0.25-35 MG-MCG PO TABS: 1.0000 | ORAL_TABLET | Freq: Every day | ORAL | 4 refills | Status: DC

## 2021-04-02 ENCOUNTER — Ambulatory Visit (INDEPENDENT_AMBULATORY_CARE_PROVIDER_SITE_OTHER): Payer: Managed Care, Other (non HMO) | Admitting: Obstetrics & Gynecology

## 2021-04-02 ENCOUNTER — Other Ambulatory Visit: Payer: Self-pay

## 2021-04-02 ENCOUNTER — Other Ambulatory Visit (HOSPITAL_COMMUNITY)
Admission: RE | Admit: 2021-04-02 | Discharge: 2021-04-02 | Disposition: A | Discharge status: Home / Self Care | Payer: Managed Care, Other (non HMO) | Source: Ambulatory Visit | Attending: Obstetrics & Gynecology | Admitting: Obstetrics & Gynecology

## 2021-04-02 ENCOUNTER — Encounter: Payer: Self-pay | Admitting: Obstetrics & Gynecology

## 2021-04-02 VITALS — BP 107/68 | HR 82 | Ht 68.0 in | Wt 131.0 lb

## 2021-04-02 DIAGNOSIS — N870 Mild cervical dysplasia: Secondary | ICD-10-CM

## 2021-04-02 DIAGNOSIS — R87611 Atypical squamous cells cannot exclude high grade squamous intraepithelial lesion on cytologic smear of cervix (ASC-H): Secondary | ICD-10-CM | POA: Insufficient documentation

## 2021-04-02 NOTE — Progress Notes (Signed)
   Subjective:    Patient ID: Cassie Petersen, female    DOB: 1985-12-08, 35 y.o.   MRN: [id number]  HPI  35 yo female presents for pap smear after colpo (CIN 1) one year ago.  Pt delivered healthy baby boy during this time.  No complaints.     Review of Systems  Constitutional: Negative.   Respiratory: Negative.    Cardiovascular: Negative.   Gastrointestinal: Negative.   Genitourinary: Negative.       Objective:   Physical Exam Vitals reviewed.  Constitutional:      General: She is not in acute distress.    Appearance: She is well-developed.  HENT:     Head: Normocephalic and atraumatic.  Eyes:     Conjunctiva/sclera: Conjunctivae normal.  Cardiovascular:     Rate and Rhythm: Normal rate.  Pulmonary:     Effort: Pulmonary effort is normal.  Genitourinary:    Comments: Tanner V Vulva:  No lesion Vagina:  Pink, no lesions, no discharge, no blood Cervix:  No CMT, no lesion   Skin:    General: Skin is warm and dry.  Neurological:     Mental Status: She is alert and oriented to person, place, and time.  Psychiatric:        Mood and Affect: Mood normal.    Assessment & Plan:  35 yo female with CIN 1 on biopsy last year. Rpt pap smear today Will base treatment on results.  If patient needs colposcopy again, she would like Xanax for premedication and to have a little longer time during the exam.  She felt very rushed the last colposcopy that was performed.  20 minutes spent on review of records, counseling, and exam.

## 2021-04-04 LAB — CYTOLOGY - PAP
Comment: NEGATIVE
Diagnosis: UNDETERMINED — AB
High risk HPV: NEGATIVE

## 2021-08-03 ENCOUNTER — Encounter: Payer: Self-pay | Admitting: Plastic Surgery

## 2021-08-03 ENCOUNTER — Ambulatory Visit (INDEPENDENT_AMBULATORY_CARE_PROVIDER_SITE_OTHER): Payer: Self-pay | Admitting: Plastic Surgery

## 2021-08-03 ENCOUNTER — Other Ambulatory Visit: Payer: Self-pay

## 2021-08-03 DIAGNOSIS — Z719 Counseling, unspecified: Secondary | ICD-10-CM

## 2021-08-03 NOTE — Progress Notes (Signed)
Botulinum Toxin Procedure Note ? ?Procedure: Cosmetic botulinum toxin ? ?Pre-operative Diagnosis: Dynamic rhytides  ? ?Post-operative Diagnosis: Same ? ?Complications:  None ? ?Brief history: The patient desires botulinum toxin injection of her forehead. I discussed with the patient this proposed procedure of botulinum toxin injections, which is customized depending on the particular needs of the patient. It is performed on facial rhytids as a temporary correction. The alternatives were discussed with the patient. The risks were addressed including bleeding, scarring, infection, damage to deeper structures, asymmetry, and chronic pain, which may occur infrequently after a procedure. The individual's choice to undergo a surgical procedure is based on the comparison of risks to potential benefits. Other risks include unsatisfactory results, brow ptosis, eyelid ptosis, allergic reaction, temporary paralysis, which should go away with time, bruising, blurring disturbances and delayed healing. Botulinum toxin injections do not arrest the aging process or produce permanent tightening of the eyelid.  Operative intervention maybe necessary to maintain the results of a blepharoplasty or botulinum toxin. The patient understands and wishes to proceed. ? ?Procedure: The area was prepped with alcohol and dried with a clean gauze. Using a clean technique, the botulinum toxin was diluted with 1.25 cc of preservative-free normal saline which was slowly injected with an 18 gauge needle in a tuberculin syringes.  A 32 gauge needles were then used to inject the botulinum toxin. This mixture allow for an aliquot of 4 units per 0.1 cc in each injection site.   ? ?Subsequently the mixture was injected in the glabellar and forehead area with preservation of the temporal branch to the lateral eyebrow as well as into each lateral canthal area beginning from the lateral orbital rim medial to the zygomaticus major in 3 separate areas. A total  of 40 Units of botulinum toxin was used. The forehead and glabellar area was injected with care to inject intramuscular only while holding pressure on the supratrochlear vessels in each area during each injection on either side of the medial corrugators. The injection proceeded vertically superiorly to the medial 2/3 of the frontalis muscle and superior 2/3 of the lateral frontalis, again with preservation of the frontal branch. No complications were noted. Light pressure was held for 5 minutes. She was instructed explicitly in post-operative care. ? ?Botox ?LOT:  [id number] ?EXP:  25-03 ?

## 2021-08-22 ENCOUNTER — Encounter: Payer: Self-pay | Admitting: Physician Assistant

## 2021-08-22 ENCOUNTER — Other Ambulatory Visit: Payer: Self-pay

## 2021-08-22 ENCOUNTER — Telehealth (INDEPENDENT_AMBULATORY_CARE_PROVIDER_SITE_OTHER): Payer: Managed Care, Other (non HMO) | Admitting: Physician Assistant

## 2021-08-22 VITALS — Ht 68.0 in | Wt 125.0 lb

## 2021-08-22 DIAGNOSIS — G43109 Migraine with aura, not intractable, without status migrainosus: Secondary | ICD-10-CM | POA: Diagnosis not present

## 2021-08-22 DIAGNOSIS — Z3041 Encounter for surveillance of contraceptive pills: Secondary | ICD-10-CM | POA: Diagnosis not present

## 2021-08-22 MED ORDER — NORETHINDRONE 0.35 MG PO TABS: 1.0000 | ORAL_TABLET | Freq: Every day | ORAL | 0 refills | Status: DC

## 2021-08-22 MED ORDER — RIZATRIPTAN BENZOATE 5 MG PO TABS: 5.0000 mg | ORAL_TABLET | ORAL | 0 refills | Status: DC | PRN

## 2021-08-22 NOTE — Progress Notes (Signed)
? ?  Virtual Visit via Video Note  ? ?IJarold Motto, connected with  MIEL WISENER  ([id number], November 30, 1985) on 08/22/21 at  8:30 AM EDT by a video-enabled telemedicine application and verified that I am speaking with the correct person using two identifiers. ? ?Location: ?Patient: Home ?Provider: [facility] [place] office ?  ?I discussed the limitations of evaluation and management by telemedicine and the availability of in person appointments. The patient expressed understanding and agreed to proceed.   ? ?History of Present Illness: ?Cassie Petersen is a 36 y.o. who identifies as a female who was assigned female at birth, and is being seen today for migraines. She has hx of migraines a long time ago, has not recurred in years.  ? ?Migraines ?Usually gets a visual aura 1-2 days before any actually pain. Then gets significant pain for 48 hours. Also gets nausea, vomiting, light sensitivity. Will take tylenol or ibuprofen without any relief. Has had two episodes -- Feb 14 and March 6. She reports she is unsure of what is triggering her symptoms. She feels as though she is getting plenty of rest. She is unsure if she is pregnant. ? ?Took Ortho-Cyclen but none in the last two months.  ? ?Denies: worst HA of life, numbness/tingling, slurred speech, confusion, memory loss ? ?Problems:  ?Patient Active Problem List  ? Diagnosis Date Noted  ? Encounter for counseling 12/20/2020  ? History of vitamin D deficiency 06/14/2020  ? PAI-1 4G/4G genotype 06/12/2020  ? Abnormal Pap smear of cervix 12/02/2019  ? Bicornuate uterus   ?  ?Allergies: No Known Allergies ?Medications:  ?Current Outpatient Medications:  ?  norethindrone (ORTHO MICRONOR) 0.35 MG tablet, Take 1 tablet (0.35 mg total) by mouth daily., Disp: 84 tablet, Rfl: 0 ?  rizatriptan (MAXALT) 5 MG tablet, Take 1 tablet (5 mg total) by mouth as needed for migraine. May repeat in 2 hours if needed, Disp: 10 tablet, Rfl: 0 ? ?Observations/Objective: ?Patient  is well-developed, well-nourished in no acute distress.  ?Resting comfortably at home.  ?Head is normocephalic, atraumatic.  ?No labored breathing.  ?Speech is clear and coherent with logical content.  ?Patient is alert and oriented at baseline.  ? ?Assessment and Plan: ?Migraine with aura and without status migrainosus, not intractable; Encounter for surveillance of contraceptive pills ?No red flags on discussion ?Discussed need to stop estrogen-containing OCP due to increased risk of stroke ?Recommend taking home UPT ?If negative, may start POP - micronor sent ?May trial maxalt 5 mg prn for migraines ?Follow-up with PCP in 3 months, sooner if concerns ?Red flags reviewed ? ?Follow Up Instructions: ?I discussed the assessment and treatment plan with the patient. The patient was provided an opportunity to ask questions and all were answered. The patient agreed with the plan and demonstrated an understanding of the instructions.  A copy of instructions were sent to the patient via MyChart unless otherwise noted below.  ? ?The patient was advised to call back or seek an in-person evaluation if the symptoms worsen or if the condition fails to improve as anticipated. ? ?Jarold Motto, PA ?

## 2021-10-18 ENCOUNTER — Other Ambulatory Visit: Payer: Self-pay | Admitting: Physician Assistant

## 2021-10-19 ENCOUNTER — Telehealth: Payer: Self-pay | Admitting: Family Medicine

## 2021-10-19 ENCOUNTER — Telehealth: Payer: Self-pay | Admitting: Physician Assistant

## 2021-10-19 MED ORDER — RIZATRIPTAN BENZOATE 5 MG PO TABS: 5.0000 mg | ORAL_TABLET | ORAL | 0 refills | Status: DC | PRN

## 2021-10-19 NOTE — Telephone Encounter (Signed)
Pt states she received notice that her pharmacy could not fill the medication.  .. Encourage patient to contact the pharmacy for refills or they can request refills through [facility]  LAST APPOINTMENT DATE:   08/22/21 MyChart  NEXT APPOINTMENT DATE: N/a  MEDICATION: rizatriptan (MAXALT) 5 MG tablet [[id number]  Is the patient out of medication?  yes   [id number] [place] [place] - [address]  [address] [place] [address]  Phone:  [phone]  Fax:  [phone]    Let patient know to contact pharmacy at the end of the day to make sure medication is ready.  Please notify patient to allow 48-72 hours to process

## 2021-10-19 NOTE — Telephone Encounter (Signed)
Spoke to pt told her Rx for Maxalt was sent to pharmacy, verified pharmacy. Pt verbalized understanding.

## 2021-10-19 NOTE — Telephone Encounter (Signed)
Pt states she would like her PCP to be switched from Dr. Asencion Partridge to Jarold Motto, PA-C  Pt states she saw Dr. Mardelle Matte at her initial appointment in 2020 and has seen Mrs. Worley for all other appointments. Pt was unaware that Dr. Mardelle Matte was listed as her PCP.    Approve or Decline?

## 2021-10-23 ENCOUNTER — Encounter: Payer: Self-pay | Admitting: Plastic Surgery

## 2021-10-23 ENCOUNTER — Ambulatory Visit (INDEPENDENT_AMBULATORY_CARE_PROVIDER_SITE_OTHER): Payer: Managed Care, Other (non HMO) | Admitting: Plastic Surgery

## 2021-10-23 DIAGNOSIS — Z719 Counseling, unspecified: Secondary | ICD-10-CM

## 2021-10-23 NOTE — Progress Notes (Signed)
Botulinum Toxin Procedure Note ? ?Procedure: Cosmetic botulinum toxin  ? ?Pre-operative Diagnosis: Dynamic rhytides ? ?Post-operative Diagnosis: Same ? ?Complications:  None ? ?Brief history: The patient desires botulinum toxin injection of her forehead. I discussed with the patient this proposed procedure of botulinum toxin injections, which is customized depending on the particular needs of the patient. It is performed on facial rhytids as a temporary correction. The alternatives were discussed with the patient. The risks were addressed including bleeding, scarring, infection, damage to deeper structures, asymmetry, and chronic pain, which may occur infrequently after a procedure. The individual's choice to undergo a surgical procedure is based on the comparison of risks to potential benefits. Other risks include unsatisfactory results, brow ptosis, eyelid ptosis, allergic reaction, temporary paralysis, which should go away with time, bruising, blurring disturbances and delayed healing. Botulinum toxin injections do not arrest the aging process or produce permanent tightening of the eyelid.  Operative intervention maybe necessary to maintain the results of a blepharoplasty or botulinum toxin. The patient understands and wishes to proceed. ? ?Procedure: The area was prepped with alcohol and dried with a clean gauze. Using a clean technique, the botulinum toxin was diluted with 1.25 cc of preservative-free normal saline which was slowly injected with an 18 gauge needle in a tuberculin syringes.  A 32 gauge needles were then used to inject the botulinum toxin. This mixture allow for an aliquot of 4 units per 0.1 cc in each injection site.   ? ?Subsequently the mixture was injected in the glabellar and forehead area with preservation of the temporal branch to the lateral eyebrow as well as into each lateral canthal area beginning from the lateral orbital rim medial to the zygomaticus major in 3 separate areas. A total  of 40 Units of botulinum toxin was used. The forehead and glabellar area was injected with care to inject intramuscular only while holding pressure on the supratrochlear vessels in each area during each injection on either side of the medial corrugators. The injection proceeded vertically superiorly to the medial 2/3 of the frontalis muscle and superior 2/3 of the lateral frontalis, again with preservation of the frontal branch.  No complications were noted. Light pressure was held for 5 minutes. She was instructed explicitly in post-operative care. ? ?Botox ?LOT:  [id number] ?EXP:  2025/4 ? ?

## 2022-01-11 ENCOUNTER — Other Ambulatory Visit: Payer: Self-pay | Admitting: Physician Assistant

## 2022-01-11 NOTE — Telephone Encounter (Signed)
Patient's husband Casimiro Needle called for status of RX request for rizatriptan (MAXALT) 5 MG tablet  Patient's husband Casimiro Needle states that Patient has a severe migraine and is out of the above medication. Requests RX be sent asap to [facility] [id number] [place], [place] - [address] Phone:  [phone]  Fax:  [phone]

## 2022-01-11 NOTE — Telephone Encounter (Signed)
Patient has also phoned in refill request.

## 2022-02-25 ENCOUNTER — Encounter: Payer: Self-pay | Admitting: *Deleted

## 2022-03-08 ENCOUNTER — Ambulatory Visit (INDEPENDENT_AMBULATORY_CARE_PROVIDER_SITE_OTHER): Payer: Self-pay | Admitting: Surgical

## 2022-03-08 DIAGNOSIS — Z719 Counseling, unspecified: Secondary | ICD-10-CM

## 2022-03-08 NOTE — Progress Notes (Signed)
Botulinum Toxin Procedure Note  Procedure: Cosmetic botulinum toxin  Pre-operative Diagnosis: Dynamic rhytides  Post-operative Diagnosis: Same  Complications:  None  Brief history: The patient desires botulinum toxin injection.  She is aware of the risks including bleeding, damage to deeper structures, asymmetry, brow ptosis, eyelid ptosis, bruising. The patient understands and wishes to proceed.  Procedure: The area was prepped with alcohol and dried with a clean gauze.  Using a clean technique the botulinum toxin was diluted with 2.5 mL of bacteriostatic saline per 100 unit vial which resulted in 4 units per 0.1 mL.  Subsequently the mixture was injected in the glabellar, lateral canthal lines, forehead area with preservation of the temporal branch to the lateral eyebrow. A total of 42 Units of botulinum toxin was used. The forehead and glabellar area was injected with care to inject intramuscular only while holding pressure on the supratrochlear vessels in each area during each injection on either side of the medial corrugators. The injection proceeded vertically superiorly to the medial 2/3 of the frontalis muscle and superior 2/3 of the lateral frontalis, again with preservation of the frontal branch.  No complications were noted. Light pressure was held. She was instructed explicitly in post-operative care.  We injected a total of 24 units in the glabella, with two injections in the procerus muscle as patient stated she has had trouble in the past with being "frozen" here.   Botox LOT:  [id number] EXP:  04/2024

## 2022-05-16 ENCOUNTER — Encounter: Payer: Self-pay | Admitting: *Deleted

## 2022-07-25 NOTE — Progress Notes (Signed)
Last Mammogram: n/a Last Pap Smear:  04/02/21- ASCUS Last Colon Screening;  n/a Seat Belts:   yes Sun Screen:   yes Dental Check Up:  yes Brush & Floss:  yes

## 2022-07-29 ENCOUNTER — Other Ambulatory Visit (HOSPITAL_COMMUNITY)
Admission: RE | Admit: 2022-07-29 | Discharge: 2022-07-29 | Disposition: A | Discharge status: Home / Self Care | Payer: Managed Care, Other (non HMO) | Source: Ambulatory Visit | Attending: Obstetrics & Gynecology | Admitting: Obstetrics & Gynecology

## 2022-07-29 ENCOUNTER — Ambulatory Visit: Payer: Managed Care, Other (non HMO) | Admitting: Obstetrics & Gynecology

## 2022-07-29 ENCOUNTER — Encounter: Payer: Self-pay | Admitting: Obstetrics & Gynecology

## 2022-07-29 VITALS — BP 132/76 | HR 94 | Ht 68.0 in | Wt 128.0 lb

## 2022-07-29 DIAGNOSIS — Z01419 Encounter for gynecological examination (general) (routine) without abnormal findings: Secondary | ICD-10-CM

## 2022-07-29 DIAGNOSIS — Z803 Family history of malignant neoplasm of breast: Secondary | ICD-10-CM | POA: Insufficient documentation

## 2022-07-29 DIAGNOSIS — Z319 Encounter for procreative management, unspecified: Secondary | ICD-10-CM

## 2022-07-29 MED ORDER — LETROZOLE 2.5 MG PO TABS: ORAL_TABLET | ORAL | 0 refills | Status: DC

## 2022-07-29 NOTE — Progress Notes (Signed)
Subjective:     Cassie Petersen is a 37 y.o. female here for a routine exam.  Current complaints: stopped POPs to conceive.  Also only recent diagnosis with migraine with aura and she never go on oral contraceptives with estrogen.  Patient had no trouble conceiving her last pregnancies.  She has had several miscarriages until she started heparin for the PAI gene mutation.  She is on her 25-monthwith timed intercourse with ovulation kits.  Gynecologic History Patient's last menstrual period was 07/13/2022. Contraception: none Last Mammogram: n/a Last Pap Smear:  04/02/21- ASCUS Last Colon Screening;  n/a Seat Belts:   yes Sun Screen:   yes Dental Check Up:  yes Brush & Floss:  yes   Obstetric History OB History  Gravida Para Term Preterm AB Living  '4 2 2 '$ 0 2 2  SAB IAB Ectopic Multiple Live Births  2 0 0 0 2    # Outcome Date GA Lbr Len/2nd Weight Sex Delivery Anes PTL Lv  4 Term 12/20/20 38w3d2:44 / 00:36 7 lb 8.6 oz (3.419 kg) M Vag-Spont EPI  LIV     Birth Comments: wnl  3 Term 01/13/18 3949w1d:57 / 01:46 8 lb 5.2 oz (3.776 kg) M Vag-Spont EPI  LIV     Birth Comments: moulding  2 SAB           1 SAB              The following portions of the patient's history were reviewed and updated as appropriate: allergies, current medications, past family history, past medical history, past social history, past surgical history, and problem list.  Review of Systems Pertinent items noted in HPI and remainder of comprehensive ROS otherwise negative.    Objective:   Vitals:   07/29/22 1005  BP: 132/76  Pulse: 94  Weight: 128 lb (58.1 kg)  Height: '5\' 8"'$  (1.727 m)   Vitals:  WNL General appearance: alert, cooperative and no distress  HEENT: Normocephalic, without obvious abnormality, atraumatic Eyes: negative Throat: lips, mucosa, and tongue normal; teeth and gums normal  Respiratory: Clear to auscultation bilaterally  CV: Regular rate and rhythm  Breasts:  Normal  appearance, no masses or tenderness, no nipple retraction or dimpling  GI: Soft, non-tender; bowel sounds normal; no masses,  no organomegaly  GU: External Genitalia:  Tanner V, no lesion Urethra:  No prolapse   Vagina: Pink, normal rugae, no blood or discharge  Cervix: No CMT, no lesion  Uterus:  Normal size and contour, non tender  Adnexa: Normal, no masses, non tender  Musculoskeletal: No edema, redness or tenderness in the calves or thighs  Skin: No lesions or rash  Lymphatic: Axillary adenopathy: none     Psychiatric: Normal mood and behavior      Assessment:    Healthy female exam.   Infertility Plan:   Pap up to date Patient asked her grandmother if she was ever tested for BRCA and other hereditary cancers for pre-menopausal breast cancer Semen analysis for her husband[facility] Dr. YalKerin [facility]--Given that she is already having timed intercourse using over-the-counter ovulation predictor kits, she would like to proceed with Femara.  Instructions reviewed and patient understands.

## 2022-07-30 ENCOUNTER — Ambulatory Visit (INDEPENDENT_AMBULATORY_CARE_PROVIDER_SITE_OTHER): Payer: Self-pay | Admitting: Surgical

## 2022-07-30 DIAGNOSIS — Z719 Counseling, unspecified: Secondary | ICD-10-CM

## 2022-07-30 DIAGNOSIS — Z411 Encounter for cosmetic surgery: Secondary | ICD-10-CM

## 2022-07-30 NOTE — Progress Notes (Signed)
Botulinum Toxin Procedure Note  Procedure: Cosmetic botulinum toxin  Pre-operative Diagnosis: Dynamic rhytides  Post-operative Diagnosis: Same  Complications:  None  Brief history: The patient desires botulinum toxin injection.  She is aware of the risks including bleeding, damage to deeper structures, asymmetry, brow ptosis, eyelid ptosis, bruising. The patient understands and wishes to proceed.  She reports that her last Botox injection here at the office went great and she was very happy with the results.  She would like to replicate that if possible.  At that time she had a total of 42 units, she would like some Botox in the lateral canthal lines, we will plan to inject those today.  We agreed upon 50 units, an additional 8 units in the lateral canthal lines with 2 injection sites on each side  Procedure: The area was prepped with alcohol and dried with a clean gauze.  Using a clean technique the botulinum toxin was diluted with 2.5 mL of bacteriostatic saline per 100 unit vial which resulted in 4 units per 0.1 mL.  Subsequently the mixture was injected in the glabellar, lateral canthal lines, forehead area with preservation of the temporal branch to the lateral eyebrow. A total of 50 Units of botulinum toxin was used. The forehead and glabellar area was injected with care to inject intramuscular only while holding pressure on the supratrochlear vessels in each area during each injection on either side of the medial corrugators. The injection proceeded vertically superiorly to the medial 2/3 of the frontalis muscle and superior 2/3 of the lateral frontalis, again with preservation of the frontal branch.  No complications were noted. Light pressure was held for 5 minutes. She was instructed explicitly in post-operative care.  Pictures were obtained of the patient and placed in the chart with the patient's or guardian's permission.     Botox LOT:  [id number] EXP:  08/2024

## 2022-07-31 LAB — CYTOLOGY - PAP
Comment: NEGATIVE
Diagnosis: UNDETERMINED — AB
High risk HPV: NEGATIVE

## 2022-08-25 ENCOUNTER — Encounter: Payer: Self-pay | Admitting: Obstetrics & Gynecology

## 2022-09-06 ENCOUNTER — Other Ambulatory Visit: Payer: Self-pay | Admitting: Physician Assistant

## 2022-09-06 MED ORDER — RIZATRIPTAN BENZOATE 5 MG PO TABS: 5.0000 mg | ORAL_TABLET | ORAL | 0 refills | Status: DC | PRN

## 2022-09-06 NOTE — Telephone Encounter (Signed)
Patient states: - She can no longer use [facility] due to insurance not covering many meds there; new pharmacy is [facility] on [address].  - She updated this in mychart prior to request being sent but it placed old pharmacy instead.   Patient requests this be sent to [facility].

## 2022-09-06 NOTE — Telephone Encounter (Signed)
Last OV 08/22/21, OK to fill?

## 2022-09-23 ENCOUNTER — Encounter: Payer: Self-pay | Admitting: Obstetrics & Gynecology

## 2022-10-25 ENCOUNTER — Telehealth: Payer: Self-pay | Admitting: Physician Assistant

## 2022-10-25 ENCOUNTER — Other Ambulatory Visit: Payer: Self-pay

## 2022-10-25 NOTE — Telephone Encounter (Signed)
Prescription Request  10/25/2022  LOV: Visit date not found  What is the name of the medication or equipment?  rizatriptan (MAXALT) 5 MG tablet   Have you contacted your pharmacy to request a refill? No   Which pharmacy would you like this sent to? [facility] [address], [place] - [address] [place] [place] [address] AT n/a [address] [place] [address] [place] [address] Phone: [phone] Fax: [phone]   Patient notified that their request is being sent to the clinical staff for review and that they should receive a response within 2 business days.   Please advise at Mobile [phone] (mobile)

## 2022-10-25 NOTE — Telephone Encounter (Signed)
Returned pt call and lvm with cb number advising no more refills until she has an appointment with PCP. Last visit was virtual in 08/2021.

## 2022-10-30 ENCOUNTER — Other Ambulatory Visit: Payer: Self-pay | Admitting: Obstetrics & Gynecology

## 2022-10-31 ENCOUNTER — Other Ambulatory Visit: Payer: Self-pay | Admitting: Obstetrics & Gynecology

## 2022-10-31 MED ORDER — HEPARIN SODIUM (PORCINE) 5000 UNIT/ML IJ SOLN: 5000.0000 [IU] | Freq: Two times a day (BID) | INTRAMUSCULAR | 3 refills | Status: DC

## 2022-12-12 ENCOUNTER — Ambulatory Visit: Payer: Managed Care, Other (non HMO) | Admitting: Obstetrics and Gynecology

## 2022-12-12 ENCOUNTER — Ambulatory Visit: Payer: Managed Care, Other (non HMO)

## 2022-12-12 ENCOUNTER — Encounter: Payer: Self-pay | Admitting: Obstetrics and Gynecology

## 2022-12-12 VITALS — BP 112/77 | HR 93 | Wt 124.0 lb

## 2022-12-12 DIAGNOSIS — O09529 Supervision of elderly multigravida, unspecified trimester: Secondary | ICD-10-CM | POA: Insufficient documentation

## 2022-12-12 DIAGNOSIS — Z3A12 12 weeks gestation of pregnancy: Secondary | ICD-10-CM | POA: Diagnosis not present

## 2022-12-12 DIAGNOSIS — Z1339 Encounter for screening examination for other mental health and behavioral disorders: Secondary | ICD-10-CM

## 2022-12-12 DIAGNOSIS — Z3480 Encounter for supervision of other normal pregnancy, unspecified trimester: Secondary | ICD-10-CM

## 2022-12-12 DIAGNOSIS — Z3A10 10 weeks gestation of pregnancy: Secondary | ICD-10-CM | POA: Diagnosis not present

## 2022-12-12 DIAGNOSIS — O219 Vomiting of pregnancy, unspecified: Secondary | ICD-10-CM

## 2022-12-12 DIAGNOSIS — Z3481 Encounter for supervision of other normal pregnancy, first trimester: Secondary | ICD-10-CM | POA: Diagnosis not present

## 2022-12-12 DIAGNOSIS — Z1589 Genetic susceptibility to other disease: Secondary | ICD-10-CM | POA: Diagnosis not present

## 2022-12-13 MED ORDER — VITAMIN B-6 25 MG PO TABS: 25.0000 mg | ORAL_TABLET | Freq: Three times a day (TID) | ORAL | 3 refills | Status: DC

## 2022-12-13 MED ORDER — DOXYLAMINE SUCCINATE (SLEEP) 25 MG PO TABS: 25.0000 mg | ORAL_TABLET | Freq: Every evening | ORAL | 3 refills | Status: DC

## 2022-12-13 MED ORDER — METOCLOPRAMIDE HCL 10 MG PO TABS: 10.0000 mg | ORAL_TABLET | Freq: Four times a day (QID) | ORAL | 2 refills | Status: DC | PRN

## 2022-12-13 NOTE — Progress Notes (Signed)
Subjective:   Cassie Petersen is a 37 y.o. [id number] at [redacted]w[redacted]d by LMP c/w [place] today being seen today for her first obstetrical visit.  Her obstetrical history is significant for  PAI-1 mutation, bicornuate uterus . Pregnancy history fully reviewed.  Patient reports  nausea .  HISTORY: OB History  Gravida Para Term Preterm AB Living  5 2 2  0 2 2  SAB IAB Ectopic Multiple Live Births  2 0 0 0 2    # Outcome Date GA Lbr Len/2nd Weight Sex Type Anes PTL Lv  5 Current           4 Term 12/20/20 [redacted]w[redacted]d 02:44 / 00:36 7 lb 8.6 oz (3.419 kg) M Vag-Spont EPI  LIV     Birth Comments: wnl     Name: Keturah Barre     Apgar1: 9  Apgar5: 9  3 Term 01/13/18 [redacted]w[redacted]d 25:57 / 01:46 8 lb 5.2 oz (3.776 kg) M Vag-Spont EPI  LIV     Birth Comments: moulding     Name: Herbold,BOY Skai     Apgar1: 9  Apgar5: 9  2 SAB           1 SAB              Last pap smear: Lab Results  Component Value Date   DIAGPAP (A) 07/29/2022    - Atypical squamous cells of undetermined significance (ASC-US)   HPVHIGH Negative 07/29/2022    Past Medical History:  Diagnosis Date   Bicornuate uterus    HPV in female    PONV (postoperative nausea and vomiting)    Vaginal Pap smear, abnormal    ASCUS   Past Surgical History:  Procedure Laterality Date   APPENDECTOMY     BREAST ENHANCEMENT SURGERY     BREAST SURGERY Bilateral    DILATION AND EVACUATION N/A 11/17/2019   Procedure: DILATATION AND EVACUATION;  Surgeon: Tereso Newcomer, MD;  Location: [facility] OR;  Service: Gynecology;  Laterality: N/A;   MULTIPLE TOOTH EXTRACTIONS     Family History  Problem Relation Age of Onset   Vision loss Mother    Breast cancer Paternal Grandmother 57   Healthy Father    Healthy Son    Social History   Tobacco Use   Smoking status: Never   Smokeless tobacco: Never  Vaping Use   Vaping status: Never Used  Substance Use Topics   Alcohol use: Not Currently    Comment: social   Drug use: Never   No Known  Allergies Current Outpatient Medications on File Prior to Visit  Medication Sig Dispense Refill   heparin 5000 UNIT/ML injection Inject 1 mL (5,000 Units total) into the skin every 12 (twelve) hours. 60 mL 3   letrozole (FEMARA) 2.5 MG tablet Take one tablet daily for 5 days starting on day 3 of menstrual cycle (Patient not taking: Reported on 12/12/2022) 5 tablet 0   rizatriptan (MAXALT) 5 MG tablet Take 1 tablet (5 mg total) by mouth as needed for migraine. May repeat in 2 hours if needed. Please schedule follow up with Jarold Motto, PA for further refills [phone] (Patient not taking: Reported on 12/12/2022) 8 tablet 0   No current facility-administered medications on file prior to visit.   Exam   Vitals:   12/12/22 0947  BP: 112/77  Pulse: 93  Weight: 124 lb (56.2 kg)     General:  Alert, oriented and cooperative. Patient is in no acute distress.  Breast: Deferred  Cardiovascular: Normal heart rate noted  Respiratory: Normal respiratory effort, no problems with respiration noted  Abdomen: Soft, gravid, appropriate for gestational age.  Pain/Pressure: Absent     Pelvic: Deferred  Extremities: Normal range of motion.  Edema: None  Mental Status: Normal mood and affect. Normal behavior. Normal judgment and thought content.    Assessment:   Pregnancy: [id number] Patient Active Problem List   Diagnosis Date Noted   Supervision of other normal pregnancy, antepartum 12/12/2022   AMA (advanced maternal age) multigravida 35+ 12/12/2022   Family history of breast cancer 07/29/2022   PAI-1 4G/4G genotype 06/12/2020   Abnormal Pap smear of cervix 12/02/2019   Bicornuate uterus    Plan:  1. Supervision of other normal pregnancy, antepartum 2. [redacted] weeks gestation of pregnancy Initial labs drawn. Continue prenatal vitamins. Genetic Screening discussed & ordered. Ultrasound discussed; fetal anatomic survey: ordered. Problem list reviewed and updated. The nature of [facility] with multiple MDs and other Advanced Practice Providers was explained to patient; also emphasized that residents, students are part of our team. Routine obstetric precautions reviewed. - Pregnancy, Initial Screen - [place] MFM OB COMP + 14 WK; Future - US OB Limited; Future - PANORAMA PRENATAL TEST  2. PAI-1 4G/4G genotype On prophylactic heparin w/ plan to transition to prophylactic lovenox in 2nd trimester Discussed that [facility] ppx may not be necessary w/ limited support for it's use, but pt has done well with it in her last pregnancy and would like to continue   3. Nausea/vomiting in pregnancy -     doxylamine, Sleep, (UNISOM) 25 MG tablet; Take 1 tablet (25 mg total) by mouth at bedtime. -     pyridOXINE (VITAMIN B6) 25 MG tablet; Take 1 tablet (25 mg total) by mouth in the morning, at noon, and at bedtime. -     metoCLOPramide (REGLAN) 10 MG tablet; Take 1 tablet (10 mg total) by mouth every 6 (six) hours as needed for nausea or vomiting.  Return in about 4 weeks (around 01/09/2023) for return OB at 14 weeks.  Harvie Bridge, MD Obstetrician & Gynecologist, [facility] for [employer], [facility]

## 2022-12-16 LAB — PREGNANCY, INITIAL SCREEN
Antibody Screen: NEGATIVE
Basophils Absolute: 0.1 10*3/uL (ref 0.0–0.2)
Basos: 1 %
Bilirubin, UA: NEGATIVE
Chlamydia trachomatis, NAA: NEGATIVE
EOS (ABSOLUTE): 0.1 10*3/uL (ref 0.0–0.4)
Eos: 1 %
Glucose, UA: NEGATIVE
HCV Ab: NONREACTIVE
HIV Screen 4th Generation wRfx: NONREACTIVE
Hematocrit: 39.5 % (ref 34.0–46.6)
Hemoglobin: 13.2 g/dL (ref 11.1–15.9)
Hepatitis B Surface Ag: NEGATIVE
Immature Grans (Abs): 0 10*3/uL (ref 0.0–0.1)
Immature Granulocytes: 0 %
Ketones, UA: NEGATIVE
Leukocytes,UA: NEGATIVE
Lymphocytes Absolute: 2.2 10*3/uL (ref 0.7–3.1)
Lymphs: 24 %
MCH: 30.5 pg (ref 26.6–33.0)
MCHC: 33.4 g/dL (ref 31.5–35.7)
MCV: 91 fL (ref 79–97)
Monocytes Absolute: 0.5 10*3/uL (ref 0.1–0.9)
Monocytes: 6 %
Neisseria Gonorrhoeae by PCR: NEGATIVE
Neutrophils Absolute: 6.4 10*3/uL (ref 1.4–7.0)
Neutrophils: 68 %
Nitrite, UA: NEGATIVE
Platelets: 237 10*3/uL (ref 150–450)
Protein,UA: NEGATIVE
RBC, UA: NEGATIVE
RBC: 4.33 x10E6/uL (ref 3.77–5.28)
RDW: 12.2 % (ref 11.7–15.4)
RPR Ser Ql: NONREACTIVE
Rh Factor: POSITIVE
Rubella Antibodies, IGG: 4.05 index (ref >0.99)
Specific Gravity, UA: 1.021 (ref 1.005–1.030)
Urobilinogen, Ur: 0.2 mg/dL (ref 0.2–1.0)
WBC: 9.3 10*3/uL (ref 3.4–10.8)
pH, UA: 6.5 (ref 5.0–7.5)

## 2022-12-16 LAB — URINE CULTURE, OB REFLEX

## 2022-12-16 LAB — MICROSCOPIC EXAMINATION
Casts: NONE SEEN /lpf
RBC, Urine: NONE SEEN /hpf (ref 0–2)

## 2022-12-16 LAB — HCV INTERPRETATION

## 2022-12-24 LAB — PANORAMA PRENATAL TEST FULL PANEL:PANORAMA TEST PLUS 5 ADDITIONAL MICRODELETIONS: FETAL FRACTION: 9.5

## 2023-01-04 ENCOUNTER — Other Ambulatory Visit: Payer: Self-pay | Admitting: Obstetrics & Gynecology

## 2023-01-07 ENCOUNTER — Other Ambulatory Visit: Payer: Self-pay | Admitting: *Deleted

## 2023-01-07 MED ORDER — ENOXAPARIN SODIUM 40 MG/0.4ML IJ SOSY: PREFILLED_SYRINGE | INTRAMUSCULAR | 6 refills | Status: DC

## 2023-01-07 NOTE — Progress Notes (Signed)
RX sent to [facility] for Lovenox 40 mg daily SQ.per Dr Penne Lash

## 2023-01-10 NOTE — Progress Notes (Unsigned)
   PRENATAL VISIT NOTE  Subjective:  Cassie Petersen is a 37 y.o. [id number] at [redacted]w[redacted]d being seen today for ongoing prenatal care.  She is currently monitored for the following issues for this low-risk pregnancy and has Bicornuate uterus; Abnormal Pap smear of cervix; PAI-1 4G/4G genotype; Family history of breast cancer; Supervision of other normal pregnancy, antepartum; and AMA (advanced maternal age) multigravida 35+ on their problem list.  Patient reports  burning with lovenox injection .  Contractions: Not present. Vag. Bleeding: None.   . Denies leaking of fluid.   The following portions of the patient's history were reviewed and updated as appropriate: allergies, current medications, past family history, past medical history, past social history, past surgical history and problem list.   Objective:   Vitals:   01/13/23 1402  BP: 97/65  Pulse: 87  Weight: 126 lb (57.2 kg)    Fetal Status: Fetal Heart Rate (bpm): 150         General:  Alert, oriented and cooperative. Patient is in no acute distress.  Skin: Skin is warm and dry. No rash noted.   Cardiovascular: Normal heart rate noted  Respiratory: Normal respiratory effort, no problems with respiration noted  Abdomen: Soft, gravid, appropriate for gestational age.  Pain/Pressure: Absent     Pelvic: Cervical exam deferred        Extremities: Normal range of motion.  Edema: None  Mental Status: Normal mood and affect. Normal behavior. Normal judgment and thought content.   Assessment and Plan:  Pregnancy: [id number] at [redacted]w[redacted]d 1. Bicornuate uterus 2 prior FT SVD. While unlikely to have any bearing on this pregnancy based on her history of 2 uncomplicated SVD, still carries risk of malpresentation.  2. Multigravida of advanced maternal age in second trimester LR NIPS  3. PAI-1 4G/4G genotype Offered back to heparin since burning with Lovenox. We also discussed no AC. Since no personal history of clot, no need for her to continue  this. Reviewed conflicting data on RPL and AC but since she is out of the first trimester, she does not need to continue the Lovenox (or heparin). We discussed third trimester risks of AC. She would like to discontinue based on this.   4. Supervision of other normal pregnancy, antepartum Has anatomy US scheduled MSAFP next time.    Preterm labor symptoms and general obstetric precautions including but not limited to vaginal bleeding, contractions, leaking of fluid and fetal movement were reviewed in detail with the patient. Please refer to After Visit Summary for other counseling recommendations.   No follow-ups on file.  Future Appointments  Date Time Provider Department Center  02/13/2023  9:15 AM [facility] NURSE [facility]  02/13/2023  9:30 AM [facility] US1 [facility]    Milas Hock, MD

## 2023-01-13 ENCOUNTER — Ambulatory Visit (INDEPENDENT_AMBULATORY_CARE_PROVIDER_SITE_OTHER): Payer: Managed Care, Other (non HMO) | Admitting: Obstetrics and Gynecology

## 2023-01-13 ENCOUNTER — Encounter: Payer: Self-pay | Admitting: Obstetrics and Gynecology

## 2023-01-13 VITALS — BP 97/65 | HR 87 | Wt 126.0 lb

## 2023-01-13 DIAGNOSIS — Z1589 Genetic susceptibility to other disease: Secondary | ICD-10-CM

## 2023-01-13 DIAGNOSIS — Z3A14 14 weeks gestation of pregnancy: Secondary | ICD-10-CM

## 2023-01-13 DIAGNOSIS — O09522 Supervision of elderly multigravida, second trimester: Secondary | ICD-10-CM

## 2023-01-13 DIAGNOSIS — Z3480 Encounter for supervision of other normal pregnancy, unspecified trimester: Secondary | ICD-10-CM

## 2023-01-13 DIAGNOSIS — Q513 Bicornate uterus: Secondary | ICD-10-CM

## 2023-01-13 NOTE — Progress Notes (Signed)
ROB: Denies any concerns  Pt requesting ultrasound today

## 2023-02-10 ENCOUNTER — Ambulatory Visit (INDEPENDENT_AMBULATORY_CARE_PROVIDER_SITE_OTHER): Payer: Managed Care, Other (non HMO) | Admitting: Obstetrics & Gynecology

## 2023-02-10 VITALS — BP 106/69 | HR 92 | Wt 129.0 lb

## 2023-02-10 DIAGNOSIS — Z3480 Encounter for supervision of other normal pregnancy, unspecified trimester: Secondary | ICD-10-CM

## 2023-02-10 NOTE — Progress Notes (Signed)
   PRENATAL VISIT NOTE  Subjective:  Cassie Petersen is a 37 y.o. [id number] at [redacted]w[redacted]d being seen today for ongoing prenatal care.  She is currently monitored for the following issues for this high-risk pregnancy and has Bicornuate uterus; Abnormal Pap smear of cervix; PAI-1 4G/4G genotype; Family history of breast cancer; Supervision of other normal pregnancy, antepartum; and AMA (advanced maternal age) multigravida 35+ on their problem list.  Patient reports no complaints.  Contractions: Not present. Vag. Bleeding: None.  Movement: Present. Denies leaking of fluid.   The following portions of the patient's history were reviewed and updated as appropriate: allergies, current medications, past family history, past medical history, past social history, past surgical history and problem list.   Objective:   Vitals:   02/10/23 1401  BP: 106/69  Pulse: 92  Weight: 129 lb (58.5 kg)    Fetal Status: Fetal Heart Rate (bpm): 154   Movement: Present     General:  Alert, oriented and cooperative. Patient is in no acute distress.  Skin: Skin is warm and dry. No rash noted.   Cardiovascular: Normal heart rate noted  Respiratory: Normal respiratory effort, no problems with respiration noted  Abdomen: Soft, gravid, appropriate for gestational age.  Pain/Pressure: Absent     Pelvic: Cervical exam deferred        Extremities: Normal range of motion.  Edema: None  Mental Status: Normal mood and affect. Normal behavior. Normal judgment and thought content.   Assessment and Plan:  Pregnancy: [id number] at [redacted]w[redacted]d 1. Supervision of other normal pregnancy, antepartum - AFP, Serum, Open Spina Bifida - [place] scheduled--does not want to know gender - Off Lovenox due to burning on injections (see note with Dr. Para March last vivist for more detail)  Preterm labor symptoms and general obstetric precautions including but not limited to vaginal bleeding, contractions, leaking of fluid and fetal movement were reviewed in  detail with the patient. Please refer to After Visit Summary for other counseling recommendations.   No follow-ups on file.  Future Appointments  Date Time Provider Department Center  02/13/2023  9:15 AM [facility] NURSE [facility]  02/13/2023  9:30 AM [facility] US1 [facility]    [name], MD

## 2023-02-12 ENCOUNTER — Encounter: Payer: Self-pay | Admitting: *Deleted

## 2023-02-12 LAB — AFP, SERUM, OPEN SPINA BIFIDA
AFP MoM: 1.65
AFP Value: 84.7 ng/mL
Gest. Age on Collection Date: 18.6 wk
Maternal Age At EDD: 37.8 a
OSBR Risk 1 IN: 1848
Test Results:: NEGATIVE
Weight: 129 [lb_av]

## 2023-02-13 ENCOUNTER — Other Ambulatory Visit: Payer: Self-pay | Admitting: *Deleted

## 2023-02-13 ENCOUNTER — Ambulatory Visit: Payer: Managed Care, Other (non HMO) | Attending: Obstetrics and Gynecology

## 2023-02-13 ENCOUNTER — Other Ambulatory Visit: Payer: Self-pay | Admitting: Obstetrics and Gynecology

## 2023-02-13 ENCOUNTER — Ambulatory Visit: Payer: Managed Care, Other (non HMO)

## 2023-02-13 DIAGNOSIS — O09522 Supervision of elderly multigravida, second trimester: Secondary | ICD-10-CM | POA: Diagnosis present

## 2023-02-13 DIAGNOSIS — Z3480 Encounter for supervision of other normal pregnancy, unspecified trimester: Secondary | ICD-10-CM

## 2023-02-13 DIAGNOSIS — O34592 Maternal care for other abnormalities of gravid uterus, second trimester: Secondary | ICD-10-CM | POA: Insufficient documentation

## 2023-02-13 DIAGNOSIS — O219 Vomiting of pregnancy, unspecified: Secondary | ICD-10-CM

## 2023-02-13 DIAGNOSIS — Z3A19 19 weeks gestation of pregnancy: Secondary | ICD-10-CM | POA: Insufficient documentation

## 2023-02-13 DIAGNOSIS — Z3481 Encounter for supervision of other normal pregnancy, first trimester: Secondary | ICD-10-CM

## 2023-02-13 DIAGNOSIS — Q513 Bicornate uterus: Secondary | ICD-10-CM | POA: Insufficient documentation

## 2023-02-13 DIAGNOSIS — O358XX0 Maternal care for other (suspected) fetal abnormality and damage, not applicable or unspecified: Secondary | ICD-10-CM | POA: Insufficient documentation

## 2023-02-13 DIAGNOSIS — Z3A10 10 weeks gestation of pregnancy: Secondary | ICD-10-CM

## 2023-02-13 DIAGNOSIS — Z1589 Genetic susceptibility to other disease: Secondary | ICD-10-CM

## 2023-02-23 ENCOUNTER — Encounter: Payer: Self-pay | Admitting: Obstetrics & Gynecology

## 2023-02-23 DIAGNOSIS — O0970 Supervision of high risk pregnancy due to social problems, unspecified trimester: Secondary | ICD-10-CM | POA: Insufficient documentation

## 2023-02-26 ENCOUNTER — Encounter: Payer: Self-pay | Admitting: Obstetrics & Gynecology

## 2023-03-10 ENCOUNTER — Ambulatory Visit: Payer: Managed Care, Other (non HMO) | Admitting: Obstetrics & Gynecology

## 2023-03-10 VITALS — BP 96/60 | HR 86 | Wt 132.0 lb

## 2023-03-10 DIAGNOSIS — Q513 Bicornate uterus: Secondary | ICD-10-CM

## 2023-03-10 DIAGNOSIS — O0972 Supervision of high risk pregnancy due to social problems, second trimester: Secondary | ICD-10-CM

## 2023-03-10 DIAGNOSIS — Z23 Encounter for immunization: Secondary | ICD-10-CM | POA: Diagnosis not present

## 2023-03-10 DIAGNOSIS — Z3A22 22 weeks gestation of pregnancy: Secondary | ICD-10-CM

## 2023-03-10 DIAGNOSIS — O0970 Supervision of high risk pregnancy due to social problems, unspecified trimester: Secondary | ICD-10-CM

## 2023-03-10 NOTE — Progress Notes (Signed)
Patient ID: SOUL DEVENEY, female   DOB: 11/25/1985, 37 y.o.   MRN: [id number]    PRENATAL VISIT NOTE  Subjective:  ANJU SERENO is a 37 y.o. [id number] at [redacted]w[redacted]d being seen today for ongoing prenatal care.  She is currently monitored for the following issues for this low-risk pregnancy and has Bicornuate uterus; Abnormal Pap smear of cervix; PAI-1 4G/4G genotype; Family history of breast cancer; AMA (advanced maternal age) multigravida 35+; and Suprvsn of high risk preg due to social problems, unsp tri on their problem list.  Patient reports no complaints.  Contractions: Not present. Vag. Bleeding: None.  Movement: Present. Denies leaking of fluid.   The following portions of the patient's history were reviewed and updated as appropriate: allergies, current medications, past family history, past medical history, past social history, past surgical history and problem list.   Objective:   Vitals:   03/10/23 1355  BP: 96/60  Pulse: 86  Weight: 132 lb (59.9 kg)    Fetal Status: Fetal Heart Rate (bpm): 145   Movement: Present     General:  Alert, oriented and cooperative. Patient is in no acute distress.  Skin: Skin is warm and dry. No rash noted.   Cardiovascular: Normal heart rate noted  Respiratory: Normal respiratory effort, no problems with respiration noted  Abdomen: Soft, gravid, appropriate for gestational age.  Pain/Pressure: Absent     Pelvic: Cervical exam deferred        Extremities: Normal range of motion.  Edema: None  Mental Status: Normal mood and affect. Normal behavior. Normal judgment and thought content.   Assessment and Plan:  Pregnancy: [id number] at [redacted]w[redacted]d 1. Suprvsn of high risk preg due to social problems, unsp tri - Flu vaccine trivalent PF, 6mos and older(Flulaval,Afluria,Fluarix,Fluzone) - Fetal pelvic kidney left--follow up later this month. - Lbs and glucola next visit  2. Needs flu shot - Flu vaccine trivalent PF, 6mos and  older(Flulaval,Afluria,Fluarix,Fluzone)  3. Bicornuate uterus No signs and symptoms of preterm labor  Preterm labor symptoms and general obstetric precautions including but not limited to vaginal bleeding, contractions, leaking of fluid and fetal movement were reviewed in detail with the patient. Please refer to After Visit Summary for other counseling recommendations.   No follow-ups on file.  Future Appointments  Date Time Provider Department Center  03/21/2023 10:30 AM [facility] US5 [facility]    [name], MD

## 2023-03-16 ENCOUNTER — Encounter: Payer: Self-pay | Admitting: Obstetrics & Gynecology

## 2023-03-18 ENCOUNTER — Other Ambulatory Visit: Payer: Self-pay | Admitting: Obstetrics & Gynecology

## 2023-03-18 MED ORDER — TERCONAZOLE 0.4 % VA CREA: 1.0000 | TOPICAL_CREAM | Freq: Every day | VAGINAL | 0 refills | Status: DC

## 2023-03-21 ENCOUNTER — Ambulatory Visit: Payer: Managed Care, Other (non HMO) | Attending: Maternal & Fetal Medicine

## 2023-03-21 ENCOUNTER — Other Ambulatory Visit: Payer: Self-pay

## 2023-03-21 ENCOUNTER — Other Ambulatory Visit: Payer: Self-pay | Admitting: *Deleted

## 2023-03-21 VITALS — BP 100/57

## 2023-03-21 DIAGNOSIS — O35EXX0 Maternal care for other (suspected) fetal abnormality and damage, fetal genitourinary anomalies, not applicable or unspecified: Secondary | ICD-10-CM

## 2023-03-21 DIAGNOSIS — O09522 Supervision of elderly multigravida, second trimester: Secondary | ICD-10-CM | POA: Diagnosis not present

## 2023-03-21 DIAGNOSIS — O0970 Supervision of high risk pregnancy due to social problems, unspecified trimester: Secondary | ICD-10-CM | POA: Insufficient documentation

## 2023-03-21 DIAGNOSIS — Z3A24 24 weeks gestation of pregnancy: Secondary | ICD-10-CM

## 2023-03-21 DIAGNOSIS — O3402 Maternal care for unspecified congenital malformation of uterus, second trimester: Secondary | ICD-10-CM | POA: Diagnosis not present

## 2023-03-21 DIAGNOSIS — Q513 Bicornate uterus: Secondary | ICD-10-CM | POA: Diagnosis not present

## 2023-03-21 DIAGNOSIS — O99112 Other diseases of the blood and blood-forming organs and certain disorders involving the immune mechanism complicating pregnancy, second trimester: Secondary | ICD-10-CM

## 2023-03-21 DIAGNOSIS — D6869 Other thrombophilia: Secondary | ICD-10-CM

## 2023-04-04 ENCOUNTER — Ambulatory Visit (INDEPENDENT_AMBULATORY_CARE_PROVIDER_SITE_OTHER): Payer: Managed Care, Other (non HMO) | Admitting: Obstetrics and Gynecology

## 2023-04-04 VITALS — BP 98/61 | HR 92 | Wt 138.0 lb

## 2023-04-04 DIAGNOSIS — Z8759 Personal history of other complications of pregnancy, childbirth and the puerperium: Secondary | ICD-10-CM

## 2023-04-04 DIAGNOSIS — O0970 Supervision of high risk pregnancy due to social problems, unspecified trimester: Secondary | ICD-10-CM

## 2023-04-04 NOTE — Progress Notes (Deleted)
   PRENATAL VISIT NOTE  Subjective:  Cassie Petersen is a 37 y.o. [id number] at [redacted]w[redacted]d being seen today for ongoing prenatal care.  She is currently monitored for the following issues for this {Blank single:19197::"high-risk","low-risk"} pregnancy and has Bicornuate uterus; Abnormal Pap smear of cervix; PAI-1 4G/4G genotype; Family history of breast cancer; AMA (advanced maternal age) multigravida 35+; and Suprvsn of high risk preg due to social problems, unsp tri on their problem list.  Patient reports {sx:[id number]}.  Contractions: Not present. Vag. Bleeding: None.  Movement: Present. Denies leaking of fluid.   The following portions of the patient's history were reviewed and updated as appropriate: allergies, current medications, past family history, past medical history, past social history, past surgical history and problem list.   Objective:   Vitals:   04/04/23 0818  BP: 98/61  Pulse: 92  Weight: 138 lb (62.6 kg)    Fetal Status: Fetal Heart Rate (bpm): 151   Movement: Present     General:  Alert, oriented and cooperative. Patient is in no acute distress.  Skin: Skin is warm and dry. No rash noted.   Cardiovascular: Normal heart rate noted  Respiratory: Normal respiratory effort, no problems with respiration noted  Abdomen: Soft, gravid, appropriate for gestational age.  Pain/Pressure: Absent     Pelvic: {Blank single:19197::"Cervical exam performed in the presence of a chaperone","Cervical exam deferred"}        Extremities: Normal range of motion.  Edema: None  Mental Status: Normal mood and affect. Normal behavior. Normal judgment and thought content.   Assessment and Plan:  Pregnancy: [id number] at [redacted]w[redacted]d 1. Suprvsn of high risk preg due to social problems, unsp tri *** - Glucose Tolerance, 2 Hours w/1 Hour - HIV antibody (with reflex) - CBC - RPR  {Blank single:19197::"Term","Preterm"} labor symptoms and general obstetric precautions including but not limited to vaginal  bleeding, contractions, leaking of fluid and fetal movement were reviewed in detail with the patient. Please refer to After Visit Summary for other counseling recommendations.   No follow-ups on file.  Future Appointments  Date Time Provider Department Center  04/04/2023  8:50 AM Travion Ke, Harolyn Rutherford, NP [facility]  04/18/2023  2:30 PM [facility] US5 [facility]    Venia Carbon, NP

## 2023-04-04 NOTE — Progress Notes (Signed)
   PRENATAL VISIT NOTE  Subjective:  Cassie Petersen is a 37 y.o. [id number] at [redacted]w[redacted]d being seen today for ongoing prenatal care.  She is currently monitored for the following issues for this low-risk pregnancy and has Bicornuate uterus; Abnormal Pap smear of cervix; PAI-1 4G/4G genotype; Family history of breast cancer; AMA (advanced maternal age) multigravida 35+; and Suprvsn of high risk preg due to social problems, unsp tri on their problem list.  Patient reports no complaints.  Contractions: Not present. Vag. Bleeding: None.  Movement: Present. Denies leaking of fluid.   The following portions of the patient's history were reviewed and updated as appropriate: allergies, current medications, past family history, past medical history, past social history, past surgical history and problem list.   Objective:   Vitals:   04/04/23 0818  BP: 98/61  Pulse: 92  Weight: 138 lb (62.6 kg)    Fetal Status: Fetal Heart Rate (bpm): 151   Movement: Present     General:  Alert, oriented and cooperative. Patient is in no acute distress.  Skin: Skin is warm and dry. No rash noted.   Cardiovascular: Normal heart rate noted  Respiratory: Normal respiratory effort, no problems with respiration noted  Abdomen: Soft, gravid, appropriate for gestational age.  Pain/Pressure: Absent     Pelvic: Cervical exam deferred        Extremities: Normal range of motion.  Edema: None  Mental Status: Normal mood and affect. Normal behavior. Normal judgment and thought content.   Assessment and Plan:  Pregnancy: [id number] at [redacted]w[redacted]d 1. Suprvsn of high risk preg due to social problems, unsp tri  - Glucose Tolerance, 2 Hours w/1 Hour - HIV antibody (with reflex) - CBC - RPR - Has questions regarding the need for transvaginal US for possible low lying placenta. Does not feel comfortable with Female providers. Recommend she discuss transabdominal approach first.   2. History of miscarriage  - Had + Anticardiolipin IgM  in 2021-no retest. Will repeat today.  - Cardiolipin antibodies, IgM+IgG - Lupus anticoagulant - Beta-2 Glycoprotein I Ab,G/M  Preterm labor symptoms and general obstetric precautions including but not limited to vaginal bleeding, contractions, leaking of fluid and fetal movement were reviewed in detail with the patient. Please refer to After Visit Summary for other counseling recommendations.   No follow-ups on file.  Future Appointments  Date Time Provider Department Center  04/18/2023  2:30 PM [facility] US5 [facility]  04/28/2023  8:30 AM Lesly Dukes, MD [facility]  05/12/2023  8:30 AM Lesly Dukes, MD [facility]  05/26/2023  8:50 AM Milas Hock, MD [facility]    Venia Carbon, NP

## 2023-04-05 LAB — HIV ANTIBODY (ROUTINE TESTING W REFLEX): HIV Screen 4th Generation wRfx: NONREACTIVE

## 2023-04-05 LAB — CBC
Hematocrit: 36.4 % (ref 34.0–46.6)
Hemoglobin: 12.5 g/dL (ref 11.1–15.9)
MCH: 32.3 pg (ref 26.6–33.0)
MCHC: 34.3 g/dL (ref 31.5–35.7)
MCV: 94 fL (ref 79–97)
Platelets: 182 10*3/uL (ref 150–450)
RBC: 3.87 x10E6/uL (ref 3.77–5.28)
RDW: 12.6 % (ref 11.7–15.4)
WBC: 13 10*3/uL — ABNORMAL HIGH (ref 3.4–10.8)

## 2023-04-05 LAB — GLUCOSE TOLERANCE, 2 HOURS W/ 1HR
Glucose, 1 hour: 109 mg/dL (ref 70–179)
Glucose, 2 hour: 89 mg/dL (ref 70–152)
Glucose, Fasting: 77 mg/dL (ref 70–91)

## 2023-04-05 LAB — RPR: RPR Ser Ql: NONREACTIVE

## 2023-04-07 ENCOUNTER — Encounter (HOSPITAL_COMMUNITY): Payer: Self-pay | Admitting: Obstetrics & Gynecology

## 2023-04-07 ENCOUNTER — Inpatient Hospital Stay (HOSPITAL_COMMUNITY)
Admission: AD | Admit: 2023-04-07 | Discharge: 2023-04-07 | Disposition: A | Discharge status: Home / Self Care | Payer: Managed Care, Other (non HMO) | Attending: Obstetrics & Gynecology | Admitting: Obstetrics & Gynecology

## 2023-04-07 DIAGNOSIS — B379 Candidiasis, unspecified: Secondary | ICD-10-CM | POA: Diagnosis not present

## 2023-04-07 DIAGNOSIS — O99891 Other specified diseases and conditions complicating pregnancy: Secondary | ICD-10-CM | POA: Diagnosis not present

## 2023-04-07 DIAGNOSIS — B3731 Acute candidiasis of vulva and vagina: Secondary | ICD-10-CM | POA: Insufficient documentation

## 2023-04-07 DIAGNOSIS — R102 Pelvic and perineal pain: Secondary | ICD-10-CM | POA: Diagnosis present

## 2023-04-07 DIAGNOSIS — O209 Hemorrhage in early pregnancy, unspecified: Secondary | ICD-10-CM | POA: Diagnosis present

## 2023-04-07 DIAGNOSIS — Q513 Bicornate uterus: Secondary | ICD-10-CM | POA: Insufficient documentation

## 2023-04-07 DIAGNOSIS — O26892 Other specified pregnancy related conditions, second trimester: Secondary | ICD-10-CM | POA: Insufficient documentation

## 2023-04-07 DIAGNOSIS — O3402 Maternal care for unspecified congenital malformation of uterus, second trimester: Secondary | ICD-10-CM | POA: Insufficient documentation

## 2023-04-07 DIAGNOSIS — O98812 Other maternal infectious and parasitic diseases complicating pregnancy, second trimester: Secondary | ICD-10-CM | POA: Insufficient documentation

## 2023-04-07 DIAGNOSIS — O0970 Supervision of high risk pregnancy due to social problems, unspecified trimester: Secondary | ICD-10-CM

## 2023-04-07 DIAGNOSIS — R109 Unspecified abdominal pain: Secondary | ICD-10-CM | POA: Insufficient documentation

## 2023-04-07 DIAGNOSIS — N898 Other specified noninflammatory disorders of vagina: Secondary | ICD-10-CM

## 2023-04-07 DIAGNOSIS — Z3A26 26 weeks gestation of pregnancy: Secondary | ICD-10-CM | POA: Diagnosis not present

## 2023-04-07 LAB — CARDIOLIPIN ANTIBODIES, IGM+IGG
Anticardiolipin IgG: <9 [GPL'U]/mL (ref 0–14)
Anticardiolipin IgM: <9 [MPL'U]/mL (ref 0–12)

## 2023-04-07 LAB — BETA-2 GLYCOPROTEIN I AB,G/M
Beta-2 Glyco 1 IgM: <9 GPI IgM units (ref 0–32)
Beta-2 Glyco I IgG: <9 GPI IgG units (ref 0–20)

## 2023-04-07 LAB — LUPUS ANTICOAGULANT
Dilute Viper Venom Time: 24.2 s (ref 0.0–47.0)
PTT Lupus Anticoagulant: 31.6 s (ref 0.0–43.5)
Thrombin Time: 15 s (ref 0.0–23.0)
dPT Confirm Ratio: 1.03 {ratio} (ref 0.00–1.34)
dPT: 26.1 s (ref 0.0–47.6)

## 2023-04-07 LAB — WET PREP, GENITAL
Clue Cells Wet Prep HPF POC: NONE SEEN
Sperm: NONE SEEN
Trich, Wet Prep: NONE SEEN
WBC, Wet Prep HPF POC: >=10 — AB (ref <10)
Yeast Wet Prep HPF POC: NONE SEEN

## 2023-04-07 MED ORDER — TERCONAZOLE 0.4 % VA CREA: 1.0000 | TOPICAL_CREAM | Freq: Every day | VAGINAL | 0 refills | Status: DC

## 2023-04-07 NOTE — MAU Note (Signed)
.  Cassie Petersen is a 37 y.o. at [redacted]w[redacted]d here in MAU reporting: last night had some brown discharge to light red discharge and is now light brown again. Had more painful cramping last night and today is mild  pain constant. Pt reports her OB told her she may have a low lying placenta and was going to have an U/S in a few weeks.  Good fetal movement reported.  LMP:  Onset of complaint: last night Pain score: 1-2 Vitals:   04/07/23 1143  BP: 110/67  Pulse: (!) 110  Resp: 18  Temp: 98.5 F (36.9 C)     FHT:151 Lab orders placed from triage:

## 2023-04-07 NOTE — MAU Provider Note (Signed)
History     CSN: [id number]  Arrival date and time: 04/07/23 1037   Event Date/Time   First Provider Initiated Contact with Patient 04/07/23 1207      Chief Complaint  Patient presents with   Vaginal Discharge   Abdominal Pain   Cassie Petersen , a  37 y.o. [id number] at [redacted]w[redacted]d presents to MAU with complaints of vaginal discharge and lower abdominal cramping. Patient states the pain started yesterday evening. She describes it as mild and denies attempting to relieve symptoms. She currently reports 0/10 at this time. She also endorses light brown vaginal discharge that became dark brown "maroon" yesterday but has since returned to light brown and only with wiping. She reports that she was recently treated for yeast and still is having some vaginal discomfort. She reports last intercourse was >4weeks ago. She denies adequate hydration and urinary complaints. She endorses positive fetal movement.        Vaginal Discharge The patient's primary symptoms include vaginal discharge. The patient's pertinent negatives include no pelvic pain. Associated symptoms include abdominal pain. Pertinent negatives include no back pain, chills, constipation, diarrhea, dysuria, fever, headaches, nausea or vomiting.  Abdominal Pain Pertinent negatives include no constipation, diarrhea, dysuria, fever, headaches, nausea or vomiting.    OB History     Gravida  5   Para  2   Term  2   Preterm  0   AB  2   Living  2      SAB  2   IAB  0   Ectopic  0   Multiple  0   Live Births  2           Past Medical History:  Diagnosis Date   Bicornuate uterus    HPV in female    PONV (postoperative nausea and vomiting)    Supervision of other normal pregnancy, antepartum 12/12/2022              NURSING     PROVIDER      Office Location    [place]    Dating by    U/S at 10 wks      [facility] Model    Traditional    Anatomy U/S           Initiated care at     [employer]     English                     LAB RESULTS       Support Person    Casimiro Needle    Genetics    NIPS:  LR  AFP:                 NT/IT (FT only)                     Carrier Screen    Horizo   Vaginal Pap smear, abnormal    ASCUS    Past Surgical History:  Procedure Laterality Date   APPENDECTOMY     BREAST ENHANCEMENT SURGERY     BREAST SURGERY Bilateral    DILATION AND EVACUATION N/A 11/17/2019   Procedure: DILATATION AND EVACUATION;  Surgeon: Tereso Newcomer, MD;  Location: [facility] OR;  Service: Gynecology;  Laterality: N/A;   MULTIPLE TOOTH EXTRACTIONS  Family History  Problem Relation Age of Onset   Vision loss Mother    Breast cancer Paternal Grandmother 81   Healthy Father    Healthy Son     Social History   Tobacco Use   Smoking status: Never   Smokeless tobacco: Never  Vaping Use   Vaping status: Never Used  Substance Use Topics   Alcohol use: Not Currently    Comment: social   Drug use: Never    Allergies: No Known Allergies  Medications Prior to Admission  Medication Sig Dispense Refill Last Dose   Prenatal Vit-Fe Fumarate-FA (PRENATAL MULTIVITAMIN) TABS tablet Take 1 tablet by mouth daily at 12 noon.      doxylamine, Sleep, (UNISOM) 25 MG tablet Take 1 tablet (25 mg total) by mouth at bedtime. (Patient not taking: Reported on 04/04/2023) 30 tablet 3    enoxaparin (LOVENOX) 40 MG/0.4ML injection To inject 40 mg SQ daily.  Disp a 30 day supply with 6 RF's (Patient not taking: Reported on 04/04/2023) 12 mL 6    metoCLOPramide (REGLAN) 10 MG tablet Take 1 tablet (10 mg total) by mouth every 6 (six) hours as needed for nausea or vomiting. (Patient not taking: Reported on 04/04/2023) 30 tablet 2    pyridOXINE (VITAMIN B6) 25 MG tablet Take 1 tablet (25 mg total) by mouth in the morning, at noon, and at bedtime. (Patient not taking: Reported on 04/04/2023) 90 tablet 3     Review of Systems  Constitutional:  Negative for chills, fatigue and fever.  Eyes:  Negative  for pain and visual disturbance.  Respiratory:  Negative for apnea, shortness of breath and wheezing.   Cardiovascular:  Negative for chest pain and palpitations.  Gastrointestinal:  Positive for abdominal pain. Negative for constipation, diarrhea, nausea and vomiting.  Genitourinary:  Positive for vaginal discharge. Negative for difficulty urinating, dysuria, pelvic pain, vaginal bleeding and vaginal pain.  Musculoskeletal:  Negative for back pain.  Neurological:  Negative for seizures, weakness and headaches.  Psychiatric/Behavioral:  Negative for suicidal ideas.    Physical Exam   Blood pressure 110/67, pulse (!) 110, temperature 98.5 F (36.9 C), resp. rate 18, height 5\' 8"  (1.727 m), weight 62.6 kg, last menstrual period 10/03/2022.  Physical Exam Vitals and nursing note reviewed. Exam conducted with a chaperone present.  Constitutional:      General: She is not in acute distress.    Appearance: Normal appearance.  HENT:     Head: Normocephalic.  Pulmonary:     Effort: Pulmonary effort is normal.  Abdominal:     Tenderness: There is no abdominal tenderness.  Genitourinary:    Vagina: Vaginal discharge present.     Comments: White clumpy discharged adhered to maternal right vaginal wall. Tender on palpation with Q-tip. Consistent with yeast.   Small amount of light brown discharge at cervical os   SVE: closed/thick/high Dione Plover , SNM   Otherwise normal pelvic exam  Musculoskeletal:     Cervical back: Normal range of motion.  Skin:    General: Skin is warm and dry.  Neurological:     Mental Status: She is alert and oriented to person, place, and time.  Psychiatric:        Mood and Affect: Mood normal.    FHT: 145 bpm with moderate variability. 10x10 accels present small variable with quick return to baseline. (Appropriate for gestational age)  Toco: Quiet   MAU Course  Procedures Orders Placed This Encounter  Procedures   [employer]  prep, genital   Results for orders  placed or performed during the hospital encounter of 04/07/23 (from the past 24 hour(s))  Wet prep, genital     Status: Abnormal   Collection Time: 04/07/23 12:45 PM  Result Value Ref Range   Yeast Wet Prep HPF POC NONE SEEN NONE SEEN   Trich, Wet Prep NONE SEEN NONE SEEN   Clue Cells Wet Prep HPF POC NONE SEEN NONE SEEN   WBC, Wet Prep HPF POC >=10 (A) <10   Sperm NONE SEEN      MDM - Wet prep normal, but yeast visualized on exam plan for treatment.  - Cervix closed. Low suspicion for preterm labor  - Plan for discharge.   Assessment and Plan   1. Vaginal discharge   2. Suprvsn of high risk preg due to social problems, unsp tri   3. Yeast infection   4. Abdominal cramping   5. [redacted] weeks gestation of pregnancy    - Reviewed that vaginal infections can cause vaginal spotting and pelvic discomfort.  - (patient reports a really bad yeast infection and still having discomfort) Terazole 7 ordered.  - Patient agreeable to plan of care.  - FHT appropriate for gestational age upon discharge.;  - Reviewed worsening signs and return precautions.  - Patient discharged home in stable condition and may return to MAU as needed .  Claudette Head, MSN CNM  04/07/2023, 12:51 PM

## 2023-04-13 ENCOUNTER — Other Ambulatory Visit: Payer: Self-pay

## 2023-04-13 ENCOUNTER — Inpatient Hospital Stay (HOSPITAL_COMMUNITY)
Admission: AD | Admit: 2023-04-13 | Discharge: 2023-05-04 | DRG: 786 | Disposition: A | Discharge status: Home / Self Care | Payer: Managed Care, Other (non HMO) | Attending: Obstetrics and Gynecology | Admitting: Obstetrics and Gynecology

## 2023-04-13 ENCOUNTER — Inpatient Hospital Stay (HOSPITAL_COMMUNITY): Payer: Managed Care, Other (non HMO)

## 2023-04-13 ENCOUNTER — Encounter (HOSPITAL_COMMUNITY): Payer: Self-pay | Admitting: Obstetrics and Gynecology

## 2023-04-13 DIAGNOSIS — Z3A27 27 weeks gestation of pregnancy: Secondary | ICD-10-CM

## 2023-04-13 DIAGNOSIS — O42113 Preterm premature rupture of membranes, onset of labor more than 24 hours following rupture, third trimester: Secondary | ICD-10-CM | POA: Diagnosis not present

## 2023-04-13 DIAGNOSIS — O358XX0 Maternal care for other (suspected) fetal abnormality and damage, not applicable or unspecified: Secondary | ICD-10-CM | POA: Diagnosis present

## 2023-04-13 DIAGNOSIS — O34211 Maternal care for low transverse scar from previous cesarean delivery: Secondary | ICD-10-CM | POA: Diagnosis present

## 2023-04-13 DIAGNOSIS — Z3A28 28 weeks gestation of pregnancy: Secondary | ICD-10-CM | POA: Diagnosis not present

## 2023-04-13 DIAGNOSIS — Z3A25 25 weeks gestation of pregnancy: Secondary | ICD-10-CM | POA: Diagnosis not present

## 2023-04-13 DIAGNOSIS — O42919 Preterm premature rupture of membranes, unspecified as to length of time between rupture and onset of labor, unspecified trimester: Secondary | ICD-10-CM | POA: Diagnosis not present

## 2023-04-13 DIAGNOSIS — Z98891 History of uterine scar from previous surgery: Secondary | ICD-10-CM

## 2023-04-13 DIAGNOSIS — O321XX0 Maternal care for breech presentation, not applicable or unspecified: Secondary | ICD-10-CM | POA: Diagnosis present

## 2023-04-13 DIAGNOSIS — Z803 Family history of malignant neoplasm of breast: Secondary | ICD-10-CM

## 2023-04-13 DIAGNOSIS — O42912 Preterm premature rupture of membranes, unspecified as to length of time between rupture and onset of labor, second trimester: Secondary | ICD-10-CM

## 2023-04-13 DIAGNOSIS — Z3A30 30 weeks gestation of pregnancy: Secondary | ICD-10-CM | POA: Diagnosis not present

## 2023-04-13 DIAGNOSIS — O403XX0 Polyhydramnios, third trimester, not applicable or unspecified: Secondary | ICD-10-CM | POA: Diagnosis present

## 2023-04-13 DIAGNOSIS — O3402 Maternal care for unspecified congenital malformation of uterus, second trimester: Secondary | ICD-10-CM | POA: Diagnosis present

## 2023-04-13 DIAGNOSIS — Q513 Bicornate uterus: Secondary | ICD-10-CM

## 2023-04-13 DIAGNOSIS — Z3A29 29 weeks gestation of pregnancy: Secondary | ICD-10-CM

## 2023-04-13 DIAGNOSIS — O42913 Preterm premature rupture of membranes, unspecified as to length of time between rupture and onset of labor, third trimester: Secondary | ICD-10-CM | POA: Diagnosis not present

## 2023-04-13 DIAGNOSIS — O0970 Supervision of high risk pregnancy due to social problems, unspecified trimester: Principal | ICD-10-CM

## 2023-04-13 DIAGNOSIS — O3403 Maternal care for unspecified congenital malformation of uterus, third trimester: Secondary | ICD-10-CM | POA: Diagnosis not present

## 2023-04-13 DIAGNOSIS — Z821 Family history of blindness and visual loss: Secondary | ICD-10-CM | POA: Diagnosis not present

## 2023-04-13 DIAGNOSIS — O42013 Preterm premature rupture of membranes, onset of labor within 24 hours of rupture, third trimester: Secondary | ICD-10-CM | POA: Diagnosis not present

## 2023-04-13 LAB — TYPE AND SCREEN
ABO/RH(D): O POS
Antibody Screen: NEGATIVE

## 2023-04-13 LAB — CBC
HCT: 38 % (ref 36.0–46.0)
Hemoglobin: 12.7 g/dL (ref 12.0–15.0)
MCH: 30.6 pg (ref 26.0–34.0)
MCHC: 33.4 g/dL (ref 30.0–36.0)
MCV: 91.6 fL (ref 80.0–100.0)
Platelets: 229 10*3/uL (ref 150–400)
RBC: 4.15 MIL/uL (ref 3.87–5.11)
RDW: 12.6 % (ref 11.5–15.5)
WBC: 15 10*3/uL — ABNORMAL HIGH (ref 4.0–10.5)
nRBC: 0 % (ref 0.0–0.2)

## 2023-04-13 LAB — WET PREP, GENITAL
Sperm: NONE SEEN
Trich, Wet Prep: NONE SEEN
WBC, Wet Prep HPF POC: >=10 — AB (ref <10)

## 2023-04-13 LAB — RPR: RPR Ser Ql: NONREACTIVE

## 2023-04-13 MED ORDER — LACTATED RINGERS IV SOLN: INTRAVENOUS | Status: DC

## 2023-04-13 MED ORDER — SODIUM CHLORIDE 0.9 % IV SOLN
2.0000 g | Freq: Four times a day (QID) | INTRAVENOUS | Status: AC
Start: 2023-04-13 — End: 2023-04-15
  Administered 2023-04-13 – 2023-04-14 (×7): 2 g via INTRAVENOUS
  Filled 2023-04-13 (×7): qty 2000

## 2023-04-13 MED ORDER — INDOMETHACIN 25 MG PO CAPS
25.0000 mg | ORAL_CAPSULE | Freq: Four times a day (QID) | ORAL | Status: AC
  Administered 2023-04-13 – 2023-04-16 (×12): 25 mg via ORAL
  Filled 2023-04-13 (×13): qty 1

## 2023-04-13 MED ORDER — PRENATAL MULTIVITAMIN CH
1.0000 | ORAL_TABLET | Freq: Every day | ORAL | Status: DC
  Filled 2023-04-13 (×8): qty 1

## 2023-04-13 MED ORDER — METRONIDAZOLE 500 MG PO TABS
500.0000 mg | ORAL_TABLET | Freq: Two times a day (BID) | ORAL | Status: AC
  Administered 2023-04-13 – 2023-04-19 (×13): 500 mg via ORAL
  Filled 2023-04-13 (×14): qty 1

## 2023-04-13 MED ORDER — OXYMETAZOLINE HCL 0.05 % NA SOLN
1.0000 | Freq: Two times a day (BID) | NASAL | Status: AC
  Administered 2023-04-13 – 2023-04-15 (×3): 1 via NASAL
  Filled 2023-04-13: qty 30

## 2023-04-13 MED ORDER — MAGNESIUM SULFATE 40 GM/1000ML IV SOLN
1.0000 g/h | INTRAVENOUS | Status: DC
  Administered 2023-04-13: 1 g/h via INTRAVENOUS
  Filled 2023-04-13: qty 1000

## 2023-04-13 MED ORDER — BETAMETHASONE SOD PHOS & ACET 6 (3-3) MG/ML IJ SUSP
12.0000 mg | INTRAMUSCULAR | Status: AC
  Administered 2023-04-13 – 2023-04-14 (×2): 12 mg via INTRAMUSCULAR

## 2023-04-13 MED ORDER — TERBUTALINE SULFATE 1 MG/ML IJ SOLN: 0.2500 mg | Freq: Once | INTRAMUSCULAR | Status: AC

## 2023-04-13 MED ORDER — LACTATED RINGERS IV BOLUS
1000.0000 mL | Freq: Once | INTRAVENOUS | Status: AC
  Administered 2023-04-13: 1000 mL via INTRAVENOUS

## 2023-04-13 MED ORDER — OXYMETAZOLINE HCL 0.05 % NA SOLN: 1.0000 | Freq: Two times a day (BID) | NASAL | Status: DC

## 2023-04-13 MED ORDER — INDOMETHACIN 25 MG PO CAPS
50.0000 mg | ORAL_CAPSULE | Freq: Once | ORAL | Status: AC
  Administered 2023-04-13: 50 mg via ORAL
  Filled 2023-04-13: qty 2

## 2023-04-13 MED ORDER — MAGNESIUM SULFATE BOLUS VIA INFUSION
6.0000 g | Freq: Once | INTRAVENOUS | Status: AC
Start: 2023-04-13 — End: 2023-04-13
  Administered 2023-04-13: 6 g via INTRAVENOUS
  Filled 2023-04-13: qty 1000

## 2023-04-13 MED ORDER — CALCIUM CARBONATE ANTACID 500 MG PO CHEW: 2.0000 | CHEWABLE_TABLET | ORAL | Status: DC | PRN

## 2023-04-13 MED ORDER — DOCUSATE SODIUM 100 MG PO CAPS
100.0000 mg | ORAL_CAPSULE | Freq: Every day | ORAL | Status: DC
  Administered 2023-04-13 – 2023-04-18 (×6): 100 mg via ORAL
  Filled 2023-04-13 (×6): qty 1

## 2023-04-13 MED ORDER — ACETAMINOPHEN 325 MG PO TABS
650.0000 mg | ORAL_TABLET | ORAL | Status: DC | PRN
Start: 2023-04-13 — End: 2023-05-01
  Administered 2023-04-23: 650 mg via ORAL
  Filled 2023-04-13: qty 2

## 2023-04-13 MED ORDER — DM-GUAIFENESIN ER 30-600 MG PO TB12
1.0000 | ORAL_TABLET | Freq: Two times a day (BID) | ORAL | Status: DC
  Administered 2023-04-13 – 2023-04-18 (×12): 1 via ORAL
  Filled 2023-04-13 (×21): qty 1

## 2023-04-13 MED ORDER — LACTATED RINGERS IV SOLN: 125.0000 mL/h | INTRAVENOUS | Status: DC

## 2023-04-13 MED ORDER — FLUCONAZOLE 150 MG PO TABS
150.0000 mg | ORAL_TABLET | Freq: Once | ORAL | Status: AC
Start: 2023-04-13 — End: 2023-04-13
  Administered 2023-04-13: 150 mg via ORAL
  Filled 2023-04-13: qty 1

## 2023-04-13 MED ORDER — TERBUTALINE SULFATE 1 MG/ML IJ SOLN
INTRAMUSCULAR | Status: AC
  Administered 2023-04-13: 0.25 mg via SUBCUTANEOUS
  Filled 2023-04-13: qty 1

## 2023-04-13 MED ORDER — AZITHROMYCIN 250 MG PO TABS: 1000.0000 mg | ORAL_TABLET | Freq: Once | ORAL | Status: DC

## 2023-04-13 MED ORDER — AMOXICILLIN 500 MG PO CAPS
500.0000 mg | ORAL_CAPSULE | Freq: Three times a day (TID) | ORAL | Status: AC
  Administered 2023-04-15 – 2023-04-19 (×15): 500 mg via ORAL
  Filled 2023-04-13 (×15): qty 1

## 2023-04-13 NOTE — H&P (Addendum)
OBSTETRIC ADMISSION HISTORY AND PHYSICAL  Cassie Petersen is a 37 y.o. female [phone] with IUP at [redacted]w[redacted]d by LMP presenting for fetal monitoring showing variable decels to the 60s initially and PPROM. She reports +FMs, +LOF, no VB, no blurry vision, headaches or peripheral edema, and RUQ pain. She received her prenatal care at [facility].   Dating: By LMP --->  Estimated Date of Delivery: 07/10/23  Sono:   @[redacted]w[redacted]d , CWD, normal anatomy, variable presentation, posterior placental lie, 829g, 95% EFW, polyhydramnios.   Prenatal History/Complications:  -Polyhydramnios -Bicornuate uterus -AMA -Hx of 2 prior SAB -Possible fetal pelvic kidney.   Past Medical History: Past Medical History:  Diagnosis Date   Bicornuate uterus    HPV in female    PONV (postoperative nausea and vomiting)    Supervision of other normal pregnancy, antepartum 12/12/2022              NURSING     PROVIDER      Office Location    [place]    Dating by    U/S at 10 wks      [facility] Model    Traditional    Anatomy U/S           Initiated care at     [employer]     English                     LAB RESULTS       Support Person    Casimiro Needle    Genetics    NIPS:  LR  AFP:                 NT/IT (FT only)                     Carrier Screen    Horizo   Vaginal Pap smear, abnormal    ASCUS    Past Surgical History: Past Surgical History:  Procedure Laterality Date   APPENDECTOMY     BREAST ENHANCEMENT SURGERY     BREAST SURGERY Bilateral    DILATION AND EVACUATION N/A 11/17/2019   Procedure: DILATATION AND EVACUATION;  Surgeon: Tereso Newcomer, MD;  Location: [facility] OR;  Service: Gynecology;  Laterality: N/A;   MULTIPLE TOOTH EXTRACTIONS      Obstetrical History: OB History     Gravida  5   Para  2   Term  2   Preterm  0   AB  2   Living  2      SAB  2   IAB  0   Ectopic  0   Multiple  0   Live Births  2           Social History Social History   Socioeconomic History    Marital status: Married    Spouse name: Not on file   Number of children: 1   Years of education: Not on file   Highest education level: Not on file  Occupational History   Not on file  Tobacco Use   Smoking status: Never   Smokeless tobacco: Never  Vaping Use   Vaping status: Never Used  Substance and Sexual Activity   Alcohol use: Not Currently    Comment: social   Drug use: Never   Sexual activity: Yes  Other Topics  Concern   Not on file  Social History Narrative   ** Merged History Encounter **       Social Determinants of Health   Financial Resource Strain: Low Risk  (01/16/2018)   Overall Financial Resource Strain (CARDIA)    Difficulty of Paying Living Expenses: Not hard at all  Food Insecurity: No Food Insecurity (01/16/2018)   Hunger Vital Sign    Worried About Running Out of Food in the Last Year: Never true    Ran Out of Food in the Last Year: Never true  Transportation Needs: No Transportation Needs (01/16/2018)   PRAPARE - Administrator, Civil Service (Medical): No    Lack of Transportation (Non-Medical): No  Physical Activity: Sufficiently Active (01/16/2018)   Exercise Vital Sign    Days of Exercise per Week: 7 days    Minutes of Exercise per Session: 30 min  Stress: Unknown (01/16/2018)   [employer] of Occupational Health - Occupational Stress Questionnaire    Feeling of Stress : Patient declined  Social Connections: Unknown (01/16/2018)   Social Connection and Isolation Panel [NHANES]    Frequency of Communication with Friends and Family: Patient declined    Frequency of Social Gatherings with Friends and Family: Patient declined    Attends Religious Services: Patient declined    Database administrator or Organizations: Patient declined    Attends Engineer, structural: Patient declined    Marital Status: Patient declined    Family History: Family History  Problem Relation Age of Onset   Vision loss Mother    Breast cancer  Paternal Grandmother 68   Healthy Father    Healthy Son     Allergies: No Known Allergies  Medications Prior to Admission  Medication Sig Dispense Refill Last Dose   Prenatal Vit-Fe Fumarate-FA (PRENATAL MULTIVITAMIN) TABS tablet Take 1 tablet by mouth daily at 12 noon.   04/12/2023 at 2300   terconazole (TERAZOL 7) 0.4 % vaginal cream Place 1 applicator vaginally at bedtime. Use for seven days 45 g 0 Past Week     Review of Systems   All systems reviewed and negative except as stated in HPI  Blood pressure (!) 98/55, pulse (!) 108, temperature 97.9 F (36.6 C), temperature source Oral, resp. rate 16, last menstrual period 10/03/2022, SpO2 97%. General appearance: alert, cooperative, appears stated age, and no distress Lungs: clear to auscultation bilaterally Heart: regular rate and rhythm Abdomen: soft, non-tender; bowel sounds normal Extremities: Homans sign is negative, no sign of DVT Presentation:  variable Fetal monitoringBaseline: 160 bpm, Variability: Fair (1-6 bpm), Accelerations: Reactive, and Decelerations: Variable: moderate Uterine activityFrequency: Every 2-6 minutes   Prenatal labs: ABO, Rh: --/--/O POS (11/10 0335) Antibody: NEG (11/10 0335) Rubella: 4.05 (07/11 1054) RPR: Non Reactive (11/01 0907)  HBsAg: Negative (07/11 1054)  HIV: Non Reactive (11/01 0907)  GBS:   not done 1 hr Glucola 109 Genetic screening  low risk Anatomy US L sided pelvic kidney, otherwise normal  Prenatal Transfer Tool  Maternal Diabetes: No Genetic Screening: Normal Maternal Ultrasounds/Referrals: Fetal Kidney Anomalies Fetal Ultrasounds or other Referrals:  None Maternal Substance Abuse:  No Significant Maternal Medications:  None Significant Maternal Lab Results:  None Number of Prenatal Visits:greater than 3 verified prenatal visits Other Comments:  None  Results for orders placed or performed during the hospital encounter of 04/13/23 (from the past 24 hour(s))  Type and  screen   Collection Time: 04/13/23  3:35 AM  Result Value Ref  Range   ABO/RH(D) O POS    Antibody Screen NEG    Sample Expiration      04/16/2023,2359 Performed at [facility], [address], [place], [place] [address]   Wet prep, genital   Collection Time: 04/13/23  3:59 AM   Specimen: Vaginal  Result Value Ref Range   Yeast Wet Prep HPF POC PRESENT (A) NONE SEEN   Trich, Wet Prep NONE SEEN NONE SEEN   Clue Cells Wet Prep HPF POC PRESENT (A) NONE SEEN   WBC, Wet Prep HPF POC >=10 (A) <10   Sperm NONE SEEN     Patient Active Problem List   Diagnosis Date Noted   Suprvsn of high risk preg due to social problems, unsp tri 02/23/2023   AMA (advanced maternal age) multigravida 35+ 12/12/2022   Family history of breast cancer 07/29/2022   PAI-1 4G/4G genotype 06/12/2020   Abnormal Pap smear of cervix 12/02/2019   Bicornuate uterus     Assessment/Plan:  Cassie Petersen is a 37 y.o. [id number] at [redacted]w[redacted]d here for PPROM and variable decels with contractions, improved since arrival.  #Labor: s/p terbutaline IM, s/p 1st dose of  betamethasone. NICU consulted.  - Magnesium given for neuro-protection - 2nd Betamethasone in 48 hours (unless clinically necessary at 12 hours) - Will start indomethacin; continue for 72 hours q6h - Amp/Azithro for latency  - NPO; may allow clears if contractions stop  #Pain: None, well controlled #FWB: Cat 2 -> 1; overall reassuring for continued pregnancy #ID:  GBS unknown #MOF: Breast #MOC: undecided #Circ:  undecided  Barrett Shell, Medical Student  04/13/2023, 4:50 AM   Attestation of Supervision of Student:  I confirm that I have verified the information documented in the medical student's note and that I have also personally reperformed the history, physical exam and all medical decision making activities.  I have verified that all services and findings are accurately documented in this student's note; and I agree with management and  plan as outlined in the documentation. I have also made any necessary editorial changes.  Please see my separate note for details.   Milas Hock, MD Center for [facility], [facility] 04/13/2023 9:46 PM

## 2023-04-13 NOTE — MAU Note (Signed)
Dr. Para March at bedside with portable ultrasound.

## 2023-04-13 NOTE — MAU Note (Signed)
FHR was 155 when EFM removed at time of transfer to birthing suite.

## 2023-04-13 NOTE — MAU Note (Signed)
Neonatologist at the bedside.

## 2023-04-13 NOTE — Consult Note (Signed)
[name]  Prenatal Consult      04/13/2023   4:55 AM  I was asked by Dr. Para March to consult on this patient for possible preterm delivery. I had the pleasure of meeting with Cassie Petersen today.   She is a 37 yo [id number] female presenting at [redacted]w[redacted]d IUP with concerns for PPROM and PTL. Pregnancy has also been complicated by polyhydramnios, fetal L pelvic kidney. She has received BMZ x1 (at 0400 this morning). Most recent estimated fetal weight of 1161 grams today. They are having a boy, and are still deciding on his name.  We discussed the possible needs for an infant born at this gestation. I explained that the neonatal intensive care team would be present for the delivery and outlined the likely delivery room course for this baby including routine resuscitation and NRP-guided approaches to the treatment of respiratory distress. We discussed the potential need for CPAP or mechanical ventilation and surfactant administration for respiratory distress, IV fluids pending establishment of enteral feeds, antibiotics for possible infection, temperature support, and monitoring.   We discussed other common problems associated with prematurity including respiratory distress syndrome/CLD, feeding issues, temperature regulation, and infection risk. I also discussed the potential risk of complications such as intracranial hemorrhage and a range of long term developmental delays, and how these risks decrease with each additional week of gestation.  We discussed the importance of good nutrition and various methods of providing nutrition (parenteral hyperalimentation, gavage feedings and/or oral feeding). We discussed the benefits of human milk. I encouraged breast feeding and pumping soon after birth and outlined resources that are available to support breast feeding. We discussed the possibility of using donor breast milk as a bridge, and she expressed the desire to use DBM if needed  to supplement MBM.  We discussed the average length of stay but I noted that the actual LOS would depend on the severity of problems encountered and response to treatments. We discussed visitation policies and the resources available while their child is in the hospital.  She expressed understanding and agreement with the plan for resuscitation and intensive care. All questions were answered.  Thank you for involving [place] in the care of this patient. A member of our team will be available should the family have additional questions.   Simone Curia, MD Neonatal Medicine  I spent ~40 minutes in consultation time, of which 25 minutes was spent in direct face to face counseling.

## 2023-04-13 NOTE — Progress Notes (Signed)
Pt resting comfortably in bed, reports no acute changes.  Pt not feeling any contractions.  Somewhat decreased fetal movement.    O: BP 107/70   Pulse 94   Temp 98.9 F (37.2 C) (Oral)   Resp 16   LMP 10/03/2022   SpO2 97%   FHT: 125 bpm, moderate variability,+accels, no decels Toco: occasional contraction SSE: Deferred  Plan to transfer to [facility]  Myna Hidalgo, DO Attending Obstetrician & Gynecologist, Faculty Practice Center for [employer], [facility]

## 2023-04-13 NOTE — Plan of Care (Signed)
  Problem: Education: Goal: Knowledge of General Education information will improve Description: Including pain rating scale, medication(s)/side effects and non-pharmacologic comfort measures Outcome: Progressing   Problem: Health Behavior/Discharge Planning: Goal: Ability to manage health-related needs will improve Outcome: Progressing   Problem: Clinical Measurements: Goal: Ability to maintain clinical measurements within normal limits will improve Outcome: Progressing Goal: Will remain free from infection Outcome: Progressing Goal: Diagnostic test results will improve Outcome: Progressing Goal: Respiratory complications will improve Outcome: Progressing Goal: Cardiovascular complication will be avoided Outcome: Progressing   Problem: Activity: Goal: Risk for activity intolerance will decrease Outcome: Progressing   Problem: Nutrition: Goal: Adequate nutrition will be maintained Outcome: Progressing   Problem: Coping: Goal: Level of anxiety will decrease Outcome: Progressing   Problem: Elimination: Goal: Will not experience complications related to bowel motility Outcome: Progressing Goal: Will not experience complications related to urinary retention Outcome: Progressing   Problem: Pain Management: Goal: General experience of comfort will improve Outcome: Progressing   Problem: Safety: Goal: Ability to remain free from injury will improve Outcome: Progressing   Problem: Skin Integrity: Goal: Risk for impaired skin integrity will decrease Outcome: Progressing   Problem: Education: Goal: Knowledge of disease or condition will improve Outcome: Progressing Goal: Knowledge of the prescribed therapeutic regimen will improve Outcome: Progressing Goal: Individualized Educational Video(s) Outcome: Progressing   Problem: Clinical Measurements: Goal: Complications related to the disease process, condition or treatment will be avoided or minimized Outcome:  Progressing

## 2023-04-13 NOTE — MAU Note (Signed)
.  Cassie Petersen is a 37 y.o. at [redacted]w[redacted]d here in MAU reporting SROM and ctxs about an hour ago. REquested FHTs be assessed first. Doppler used and FHTs slow -approx 80s. Pt immediately walked back to 123, helped to change clothes and into bed. Gerrit Heck CNM at bedside with u/s. FHTS found and slow - 80s-90s.   Onset of complaint: 0230 Pain score: 8 Vitals:   04/13/23 0418 04/13/23 0431  BP:  (!) 98/55  Pulse: (!) 108   Resp: 16   Temp:    SpO2: 97%      FHT:80s Lab orders placed from triage:

## 2023-04-13 NOTE — MAU Provider Note (Addendum)
Cassie Petersen is a 37 y.o. female presenting for pPROM.  Patient states she had gush of brownish pink fluid around ~ 0300. She endorses fetal movement.  She states she has been having contractions since ROM.  Patient receives care at [facility] and was supervised for a high-risk pregnancy. Pregnancy and medical history significant for problems as listed below. She is GBS unknown.  She is anticipating a female infant.  Patient Active Problem List   Diagnosis Date Noted   Suprvsn of high risk preg due to social problems, unsp tri 02/23/2023   AMA (advanced maternal age) multigravida 35+ 12/12/2022   Family history of breast cancer 07/29/2022   PAI-1 4G/4G genotype 06/12/2020   Abnormal Pap smear of cervix 12/02/2019   Bicornuate uterus          NURSING  PROVIDER  Office Location  Dating by LMP c/w U/S at 10 wks  [facility] Model Traditional Anatomy U/S Pelvic kidney left, poly, 95%  Initiated care at  [facility]	[place]  English               LAB RESULTS   Support Person   Genetics NIPS:  AFP:  negative      NT/IT (FT only)        Carrier Screen Horizon:   Rhogam   A1C/GTT Early:  Third trimester: neg  Flu Vaccine Done 03/10/23      TDaP Vaccine   Blood Type O/Positive/-- (07/11 1054)  Covid Vaccine   Antibody Negative (07/11 1054)  RSV Vaccine   Rubella 4.05 (07/11 1054)  Feeding Plan breast RPR Non Reactive (07/11 1054)  Contraception unsure HBsAg Negative (07/11 1054)  Circumcision   HIV Non Reactive (07/11 1054)  Pediatrician  [facility] HCVAb Non Reactive (07/11 1054)  Prenatal Classes            Pap       Diagnosis  Date Value Ref Range Status  07/29/2022 (A)   Final    - Atypical squamous cells of undetermined significance (ASC-US)    BTLConsent   GC/CT Initial:   36wks:    VBAC  Consent   GBS For PCN allergy, check sensitivities            DME Rx [ ]  BP cuff [ ]  Weight Scale Waterbirth  [ ]  Class [ ]  Consent [ ]  CNM visit  PHQ9 & GAD7 [  ]  new OB [  ] 28 weeks  [  ] 36 weeks Induction  [ ]  Orders Entered [ ] Foley Y/N    OB History     Gravida  5   Para  2   Term  2   Preterm  0   AB  2   Living  2      SAB  2   IAB  0   Ectopic  0   Multiple  0   Live Births  2          Past Medical History:  Diagnosis Date   Bicornuate uterus    HPV in female    PONV (postoperative nausea and vomiting)    Supervision of other normal pregnancy, antepartum 12/12/2022              NURSING     PROVIDER      Office Location    [place]    Dating by  U/S at 10 wks      [facility] Model    Traditional    Anatomy U/S           Initiated care at     10wks                           Language     English                     LAB RESULTS       Support Person    Casimiro Needle    Genetics    NIPS:  LR  AFP:                 NT/IT (FT only)                     Carrier Screen    Horizo   Vaginal Pap smear, abnormal    ASCUS   Past Surgical History:  Procedure Laterality Date   APPENDECTOMY     BREAST ENHANCEMENT SURGERY     BREAST SURGERY Bilateral    DILATION AND EVACUATION N/A 11/17/2019   Procedure: DILATATION AND EVACUATION;  Surgeon: Tereso Newcomer, MD;  Location: [facility] OR;  Service: Gynecology;  Laterality: N/A;   MULTIPLE TOOTH EXTRACTIONS     Family History: family history includes Breast cancer (age of onset: 62) in her paternal grandmother; Healthy in her father and son; Vision loss in her mother. Social History:  reports that she has never smoked. She has never used smokeless tobacco. She reports that she does not currently use alcohol. She reports that she does not use drugs.     Maternal Diabetes: No Genetic Screening: Normal Maternal Ultrasounds/Referrals: Normal Fetal Ultrasounds or other Referrals:  None Maternal Substance Abuse:  No Significant Maternal Medications:  Meds include: Other: None Significant Maternal Lab Results:  None Number of Prenatal Visits:greater than 3 verified prenatal visits Maternal  Vaccinations:Flu Other Comments:  None  Review of Systems  Genitourinary:  Negative for difficulty urinating, dysuria, vaginal bleeding and vaginal discharge.  Neurological:  Negative for dizziness, light-headedness and headaches.   Maternal Medical History:  Reason for admission: Rupture of membranes.   Fetal activity: Perceived fetal activity is normal.   Last perceived fetal movement was within the past hour.   Prenatal complications: no prenatal complications Prenatal Complications - Diabetes: none.     Blood pressure 135/76, pulse 99, resp. rate 18, last menstrual period 10/03/2022. Maternal Exam:  Uterine Assessment: Contraction strength is moderate.  Contraction frequency is regular.  Abdomen: Patient reports no abdominal tenderness. Fundal height is 2+ umbilicus.   Fetal presentation: breech Introitus: Normal vulva. Normal vagina.  Ferning test: not done.  Nitrazine test: not done. Amniotic fluid character: meconium stained. Pelvis: of concern for delivery.   Cervix: Cervix evaluated by sterile speculum exam.     Fetal Exam Fetal Monitor Review: Baseline rate: 140.  Variability: moderate (6-25 bpm).   Pattern: variable decelerations.   Fetal State Assessment: Category I - tracings are normal.   Physical Exam Vitals reviewed. Exam conducted with a chaperone present (Dea, Charity fundraiser).  Constitutional:      General: She is in acute distress.     Appearance: Normal appearance.  HENT:     Head: Normocephalic and atraumatic.  Eyes:     Conjunctiva/sclera: Conjunctivae normal.  Cardiovascular:     Rate and Rhythm: Normal rate.  Pulmonary:  Effort: Pulmonary effort is normal.  Genitourinary:    General: Normal vulva.     Comments: Particulate meconium noted Speculum exam performed: Large amt clear fluid in vault.  Removed with faux swab. Anterior cervix visualized. No apparent cord noted in vault.  Musculoskeletal:     Cervical back: Normal range of motion.  Skin:     General: Skin is warm and dry.  Neurological:     Mental Status: She is alert and oriented to person, place, and time.  Psychiatric:        Mood and Affect: Mood normal.        Behavior: Behavior normal.     Prenatal labs: ABO, Rh: --/--/PENDING (11/10 0335) Antibody: PENDING (11/10 0335) Rubella: 4.05 (07/11 1054) RPR: Non Reactive (11/01 0907)  HBsAg: Negative (07/11 1054)  HIV: Non Reactive (11/01 0907)  GBS:     Assessment/Plan: -Admit to Birthing Suites -Preterm Labor Order Set -Magnesium for Neuroprotection. -Terbutaline for contractions -Labor and Delivery Orders per Protocol -NICU Consult placed. -Growth [place] to be completed.   Cherre Robins, CNM, MSN 04/13/2023, 4:09 AM

## 2023-04-13 NOTE — Progress Notes (Signed)
OB/GYN Faculty Practice: Progress Note  Subjective: Pt resting comfortably in bed.  No longer feeling painful contractions. +fetal movement  Objective: BP 122/65   Pulse 100   Temp 98.6 F (37 C) (Oral)   Resp 15   LMP 10/03/2022   SpO2 97%  Gen: no acute distress Abd: gravid, soft and non-tender SSE:  cervix appears multiparous- closed/1cm.   Ext: no edema, no calf tenderness bilaterally  FHT: 130bpm, moderate variability, +accels, no decels  Assessment and Plan: 37 y.o. [id number] [redacted]w[redacted]d admitted for PPROM  FWB: Cat. I  PPROM: contractions have now spaced out -no evidence of labor or infection -continue Indomethacin -continue Mag, latency antibiotics -2nd BMZ tmr @ 0400 -if pt remains stable x 12hr will plan to transfer to [facility]  Myna Hidalgo, DO Attending Obstetrician & Gynecologist, [facility] for [employer], [facility]

## 2023-04-14 DIAGNOSIS — O321XX0 Maternal care for breech presentation, not applicable or unspecified: Secondary | ICD-10-CM

## 2023-04-14 DIAGNOSIS — O42013 Preterm premature rupture of membranes, onset of labor within 24 hours of rupture, third trimester: Secondary | ICD-10-CM

## 2023-04-14 DIAGNOSIS — O42919 Preterm premature rupture of membranes, unspecified as to length of time between rupture and onset of labor, unspecified trimester: Secondary | ICD-10-CM

## 2023-04-14 DIAGNOSIS — Z3A25 25 weeks gestation of pregnancy: Secondary | ICD-10-CM

## 2023-04-14 DIAGNOSIS — O42113 Preterm premature rupture of membranes, onset of labor more than 24 hours following rupture, third trimester: Secondary | ICD-10-CM | POA: Diagnosis present

## 2023-04-14 LAB — GC/CHLAMYDIA PROBE AMP (~~LOC~~) NOT AT ARMC
Chlamydia: NEGATIVE
Comment: NEGATIVE
Comment: NORMAL
Neisseria Gonorrhea: NEGATIVE

## 2023-04-14 NOTE — Plan of Care (Signed)
  Problem: Education: Goal: Knowledge of General Education information will improve Description: Including pain rating scale, medication(s)/side effects and non-pharmacologic comfort measures Outcome: Progressing   Problem: Health Behavior/Discharge Planning: Goal: Ability to manage health-related needs will improve Outcome: Progressing   Problem: Clinical Measurements: Goal: Will remain free from infection Outcome: Progressing   Problem: Activity: Goal: Risk for activity intolerance will decrease Outcome: Progressing   Problem: Coping: Goal: Level of anxiety will decrease Outcome: Progressing   Problem: Pain Management: Goal: General experience of comfort will improve Outcome: Progressing

## 2023-04-14 NOTE — Progress Notes (Signed)
RN having trouble monitoring baby while patient sleeps.  Reviewed tracing - no decels and normal baseline.  Pt not required to be on CEFM but had requested to remain on it through the night.  If patient wishes to be off the monitor, she does not require CEFM and then we would do BID NST.   Milas Hock, MD Attending Obstetrician & Gynecologist, [facility] for [facility], [facility]

## 2023-04-14 NOTE — Progress Notes (Signed)
Pt complained of not being able to sleep because RN had to keep adjusting U/S due to difficulty tracing. Spoke to Dr. Para March, she said pt could come off CFM for a while, since strip was good and so pt could sleep. Shared this information with pt- she wanted to stay on the monitor, but didn't allow RN to adjust but insisted on RN leaving monitor on. Tracing between 2:30am to end of shift not readable.

## 2023-04-14 NOTE — Progress Notes (Signed)
FACULTY PRACTICE ANTEPARTUM COMPREHENSIVE PROGRESS NOTE  Cassie Petersen is a 37 y.o. [id number] at [redacted]w[redacted]d who is admitted for PPROM.  Estimated Date of Delivery: 07/10/23 Fetal presentation is breech.  Length of Stay:  1 Days. Admitted 04/13/2023  Subjective: Doing well this morning. Denies ctx, VB. +FM  Vitals:  Blood pressure 105/65, pulse (!) 103, temperature 98.1 F (36.7 C), resp. rate 17, height 5\' 8"  (1.727 m), weight 62.1 kg, last menstrual period 10/03/2022, SpO2 99%. Physical Examination: CONSTITUTIONAL: Well-developed, well-nourished female in no acute distress.  CARDIOVASCULAR: Mild tachycardia noted, regular rhythm RESPIRATORY: Effort normal, no problems with respiration noted ABDOMEN: Soft, nontender, nondistended, gravid. CERVIX: Dilation: 1 (per spec exam) Exam by:: Dr. Charlotta Newton  Fetal monitoring: FHR: 150 bpm, Variability: moderate, Accelerations: 10x10, Decelerations: Absent  Uterine activity: Flat  Results for orders placed or performed during the hospital encounter of 04/13/23 (from the past 48 hour(s))  CBC     Status: Abnormal   Collection Time: 04/13/23  3:35 AM  Result Value Ref Range   WBC 15.0 (H) 4.0 - 10.5 K/uL   RBC 4.15 3.87 - 5.11 MIL/uL   Hemoglobin 12.7 12.0 - 15.0 g/dL   HCT 10.6 26.9 - 48.5 %   MCV 91.6 80.0 - 100.0 fL   MCH 30.6 26.0 - 34.0 pg   MCHC 33.4 30.0 - 36.0 g/dL   RDW 46.2 70.3 - 50.0 %   Platelets 229 150 - 400 K/uL   nRBC 0.0 0.0 - 0.2 %    Comment: Performed at [facility], [address] [place], [place] [address]  Type and screen     Status: None   Collection Time: 04/13/23  3:35 AM  Result Value Ref Range   ABO/RH(D) O POS    Antibody Screen NEG    Sample Expiration      04/16/2023,2359 Performed at [facility], [address] [place], [place] [address]   RPR     Status: None   Collection Time: 04/13/23  3:35 AM  Result Value Ref Range   RPR Ser Ql NON REACTIVE NON REACTIVE    Comment: Performed at [facility], [address], [place], [place] [address]  Wet prep, genital     Status: Abnormal   Collection Time: 04/13/23  3:59 AM   Specimen: Vaginal  Result Value Ref Range   Yeast Wet Prep HPF POC PRESENT (A) NONE SEEN   Trich, Wet Prep NONE SEEN NONE SEEN   Clue Cells Wet Prep HPF POC PRESENT (A) NONE SEEN   WBC, Wet Prep HPF POC >=10 (A) <10   Sperm NONE SEEN     Comment: Performed at [facility], [address], [place], [place] [address]    [place] MFM OB FOLLOW UP  Result Date: 04/14/2023 ----------------------------------------------------------------------  OBSTETRICS REPORT                       (Signed Final 04/14/2023 11:46 am) ---------------------------------------------------------------------- Patient Info  ID #:       [id number]                          D.O.B.:  Oct 10, 1985 (37 yrs)  Name:       Cassie Petersen               Visit Date: 04/13/2023 04:37 am ---------------------------------------------------------------------- Performed By  Attending:  Burk Schaible DO       Referred By:      [facility]                                                             Suites  Performed By:     Earley Brooke     [facility] ---------------------------------------------------------------------- Orders  #  Description                           Code        Ordered By  1  [place] MFM OB FOLLOW UP                   [id number]    JESSICA EMLY ----------------------------------------------------------------------  #  Order #                     Accession #                Episode #  1  [id number]                   [id number]                 [id number] ---------------------------------------------------------------------- Indications  Premature rupture of membranes - leaking       O42.90  fluid  [redacted] weeks gestation of pregnancy                Z3A.27  Determine Fetal presentation by ultrasound      Z36.89 ---------------------------------------------------------------------- Fetal Evaluation  Num Of Fetuses:         1  Fetal Heart Rate(bpm):  143  Cardiac Activity:       Observed  Presentation:           Homero Fellers breech  Placenta:               Posterior  P. Cord Insertion:      Previously seen  Amniotic Fluid  AFI FV:      Oligohydramnios                              Largest Pocket(cm)                              3.2 ---------------------------------------------------------------------- Biometry  BPD:        73  mm     G. Age:  29w 2d         90  %    CI:        75.31   %    70 - 86  FL/HC:      19.3   %    18.6 - 20.4  HC:      266.8  mm     G. Age:  29w 0d         74  %    HC/AC:      1.13        1.05 - 1.21  AC:      236.6  mm     G. Age:  28w 0d         59  %    FL/BPD:     70.5   %    71 - 87  FL:       51.5  mm     G. Age:  27w 4d         38  %    FL/AC:      21.8   %    20 - 24  HUM:      46.3  mm     G. Age:  27w 2d         44  %  LV:        7.6  mm  Est. FW:    1161  gm      2 lb 9 oz     61  % ---------------------------------------------------------------------- OB History  Maternal Racial/Ethnic Group:   White  Gravidity:    5         Term:   2         SAB:   2  Living:       2 ---------------------------------------------------------------------- Gestational Age  LMP:           27w 3d        Date:  10/03/22                   EDD:   07/10/23  U/S Today:     28w 3d                                        EDD:   07/03/23  Best:          27w 3d     Det. By:  LMP  (10/03/22)          EDD:   07/10/23 ---------------------------------------------------------------------- Targeted Anatomy  Central Nervous System  Calvarium/Cranial V.:  Appears normal         Cereb./Vermis:          Previously seen  Cavum:                 Previously seen        [place] [employer]:         Previously seen  Lateral Ventricles:    Appears normal         Midline Falx:            Previously seen  Choroid Plexus:        Previously seen  Spine  Cervical:              Previously seen        Sacral:                 Previously seen  Thoracic:              Previously seen  Shape/Curvature:        Previously seen  Lumbar:                Previously seen  Head/Neck  Lips:                  Previously seen        Profile:                Previously seen  Neck:                  Previously seen        Orbits/Eyes:            Previously seen  Nuchal Fold:           Previously seen        Mandible:               Previously seen  Nasal Bone:            Previously seen        Maxilla:                Previously seen  Thorax  4 Chamber View:        Previously seen        Interventr. Septum:     Previously seen  Cardiac Rhythm:        Normal                 Cardiac Axis:           Previously een  Cardiac Situs:         Previously seen        Diaphragm:              Appears normal  Rt Outflow Tract:      Previously seen        3 Vessel View:          Previously seen  Lt Outflow Tract:      Previously seen        3 V Trachea View:       Previously seen  Aortic Arch:           Previously seen        IVC:                    Previously seen  Ductal Arch:           Previously seen        Crossing:               Previously seen  SVC:                   Previously seen  Abdomen  Ventral Wall:          Previously seen        Lt Kidney:              Abn-Pelvic  Cord Insertion:        Previously seen        Rt Kidney:              Previously seen  Situs:                 Previously seen        Bladder:                Previously seen  Stomach:  Appears normal  Extremities  Lt Humerus:            Previously seen        Lt Femur:               Previously seen  Rt Humerus:            Previously seen        Rt Femur:               Previously seen  Lt Forearm:            Previously seen        Lt Lower Leg:           Previously seen  Rt Forearm:            Previously seen        Rt Lower Leg:            Previously seen  Lt Hand:               Previously seen        Lt Foot:                Previously seen  Rt Hand:               Previously seen        Rt Foot:                Previously seen  Other  Umbilical Cord:        Previously seen        Genitalia:              Female prev seen  Comment:     Fetal anatomic survey completed on prior scans. ---------------------------------------------------------------------- Cervix Uterus Adnexa  Cervix  Not visualized (advanced GA >24wks) ---------------------------------------------------------------------- Comments  Hospital Ultrasound  27w 3d admitted for PPROM. EDD: 07/10/2023 by LMP  (10/03/22).  Sonographic findings  Single intrauterine pregnancy.  Fetal cardiac activity: Observed and appears normal.  Presentation: Homero Fellers breech.  Limited fetal anatomy appears normal.  Amniotic fluid volume: Oligohydramnios.  MVP: 3.2 cm.  Placenta: Posterior.  Recommendations  - Agree with current management in Epic  - Continue clinical management per OB provider  This was a limited ultrasound with a remote read. If an official  MFM consult is requested for any reason please call/place an  order in Epic. ----------------------------------------------------------------------                   Braxton Feathers, DO Electronically Signed Final Report   04/14/2023 11:46 am ----------------------------------------------------------------------    Current scheduled medications  [START ON 04/15/2023] amoxicillin  500 mg Oral TID   azithromycin  1,000 mg Oral Once   dextromethorphan-guaiFENesin  1 tablet Oral BID   docusate sodium  100 mg Oral Daily   indomethacin  25 mg Oral Q6H   metroNIDAZOLE  500 mg Oral Q12H   oxymetazoline  1 spray Each Nare BID   prenatal multivitamin  1 tablet Oral Q1200    I have reviewed the patient's current medications.  ASSESSMENT: Principal Problem:   Preterm premature rupture of membranes (PPROM) with unknown onset of labor  PLAN: Latency abx day  2/7 S/p BMZ 11/10-11 S/p Mag x 12h Indocin through steroid window CS consent on chart S/p NICU Growth 11/10 1161g (61%) BID NSTs - pt currently on continuous per her preference  Continue routine antenatal care. Inpatient until delivery by 34 weeks.  Harvie Bridge, MD Obstetrician & Gynecologist, [facility] for [employer], [facility]

## 2023-04-15 NOTE — Progress Notes (Signed)
FACULTY PRACTICE ANTEPARTUM COMPREHENSIVE PROGRESS NOTE  Cassie Petersen is a 37 y.o. [id number] at [redacted]w[redacted]d who is admitted for PPROM.  Estimated Date of Delivery: 07/10/23 Fetal presentation is breech.  Length of Stay:  2 Days. Admitted 04/13/2023  Subjective: Having some intermittent back pain. Overall mild. No contractions, VB. Reports good fetal movement  Vitals:  Blood pressure 100/67, pulse 88, temperature 98.5 F (36.9 C), temperature source Oral, resp. rate 17, height 5\' 8"  (1.727 m), weight 62.1 kg, last menstrual period 10/03/2022, SpO2 97%. Physical Examination: CONSTITUTIONAL: Well-developed, well-nourished female in no acute distress.  CARDIOVASCULAR: Normal rate RESPIRATORY: Effort normal, no problems with respiration noted ABDOMEN: Soft, nontender, nondistended, gravid CERVIX: Dilation: 1 (per spec exam) Exam by:: Dr. Charlotta Newton  Fetal monitoring: FHR: 145 bpm, Variability: moderate, Accelerations: 10x10, Decelerations: Absent  Uterine activity: Flat  No results found for this or any previous visit (from the past 48 hour(s)).   No results found.  Current scheduled medications  amoxicillin  500 mg Oral TID   azithromycin  1,000 mg Oral Once   dextromethorphan-guaiFENesin  1 tablet Oral BID   docusate sodium  100 mg Oral Daily   indomethacin  25 mg Oral Q6H   metroNIDAZOLE  500 mg Oral Q12H   oxymetazoline  1 spray Each Nare BID   prenatal multivitamin  1 tablet Oral Q1200    I have reviewed the patient's current medications.  ASSESSMENT: Principal Problem:   Preterm premature rupture of membranes (PPROM) with unknown onset of labor  PLAN: Latency abx day 3/7 S/p BMZ 11/10-11 S/p Mag x 12h Indocin through steroid window CS consent on chart S/p NICU Growth 11/10 1161g (61%) BID NSTs Will discuss tdap at 28 weeks  Continue routine antenatal care. Inpatient until delivery by 34 weeks.  Harvie Bridge, MD Obstetrician & Gynecologist, [facility]  for [employer], [facility]

## 2023-04-16 DIAGNOSIS — Z3A27 27 weeks gestation of pregnancy: Secondary | ICD-10-CM

## 2023-04-16 DIAGNOSIS — O321XX0 Maternal care for breech presentation, not applicable or unspecified: Secondary | ICD-10-CM

## 2023-04-16 DIAGNOSIS — O3403 Maternal care for unspecified congenital malformation of uterus, third trimester: Secondary | ICD-10-CM

## 2023-04-16 DIAGNOSIS — O42919 Preterm premature rupture of membranes, unspecified as to length of time between rupture and onset of labor, unspecified trimester: Secondary | ICD-10-CM

## 2023-04-16 LAB — CBC
HCT: 32.6 % — ABNORMAL LOW (ref 36.0–46.0)
Hemoglobin: 11 g/dL — ABNORMAL LOW (ref 12.0–15.0)
MCH: 31.5 pg (ref 26.0–34.0)
MCHC: 33.7 g/dL (ref 30.0–36.0)
MCV: 93.4 fL (ref 80.0–100.0)
Platelets: 227 10*3/uL (ref 150–400)
RBC: 3.49 MIL/uL — ABNORMAL LOW (ref 3.87–5.11)
RDW: 12.7 % (ref 11.5–15.5)
WBC: 11.2 10*3/uL — ABNORMAL HIGH (ref 4.0–10.5)
nRBC: 0.2 % (ref 0.0–0.2)

## 2023-04-16 MED ORDER — AZITHROMYCIN 250 MG PO TABS
1000.0000 mg | ORAL_TABLET | Freq: Once | ORAL | Status: AC
  Administered 2023-04-16: 1000 mg via ORAL
  Filled 2023-04-16: qty 4

## 2023-04-16 NOTE — Progress Notes (Signed)
FACULTY PRACTICE ANTEPARTUM PROGRESS NOTE  SHALISSA Petersen is a 37 y.o. [id number] at [redacted]w[redacted]d who is admitted for PPROM.  Estimated Date of Delivery: 07/10/23 Fetal presentation is breech.  Length of Stay:  3 Days. Admitted 04/13/2023  Subjective: Pt is comfortable.  She denies fever/chills or abdominal tenderness. Patient reports normal fetal movement.  She denies uterine contractions, denies bleeding and leaking of fluid per vagina.  Vitals:  Blood pressure (!) 112/56, pulse (!) 101, temperature 98.7 F (37.1 C), temperature source Oral, resp. rate 17, height 5\' 8"  (1.727 m), weight 62.1 kg, last menstrual period 10/03/2022, SpO2 100%. Physical Examination: CONSTITUTIONAL: Well-developed, well-nourished female in no acute distress.  HENT:  Normocephalic, atraumatic, External right and left ear normal. Oropharynx is clear and moist EYES: Conjunctivae and EOM are normal.  NECK: Normal range of motion, supple, no masses. SKIN: Skin is warm and dry. No rash noted. Not diaphoretic. No erythema. No pallor. NEUROLGIC: Alert and oriented to person, place, and time. Normal reflexes, muscle tone coordination. No cranial nerve deficit noted. PSYCHIATRIC: Normal mood and affect. Normal behavior. Normal judgment and thought content. CARDIOVASCULAR: Normal heart rate noted, regular rhythm RESPIRATORY: Effort and breath sounds normal, no problems with respiration noted MUSCULOSKELETAL: Normal range of motion. No edema and no tenderness. ABDOMEN: Soft, nontender, nondistended, gravid. CERVIX: deferred  Fetal monitoring: FHR: 140s bpm, Variability: moderate, Accelerations: Present, Decelerations: Absent , category 1 strip Uterine activity:  contractions per hour  No results found for this or any previous visit (from the past 48 hour(s)).  I have reviewed the patient's current medications.  ASSESSMENT: Principal Problem:   Preterm premature rupture of membranes (PPROM) with unknown onset of  labor   PLAN: S/p BMZ x 2 S/p NICU consult BID NSTs Latency abx  day 4/7  Inpatient prenatal care until 34 weeks or per fetal clinical indications   Continue routine antenatal care.   Cassie Aloe, MD [facility] Faculty Attending, Center for [facility] 04/16/2023 7:55 AM

## 2023-04-17 DIAGNOSIS — O42912 Preterm premature rupture of membranes, unspecified as to length of time between rupture and onset of labor, second trimester: Principal | ICD-10-CM

## 2023-04-17 DIAGNOSIS — Z3A27 27 weeks gestation of pregnancy: Secondary | ICD-10-CM

## 2023-04-17 DIAGNOSIS — O3402 Maternal care for unspecified congenital malformation of uterus, second trimester: Secondary | ICD-10-CM

## 2023-04-17 LAB — TYPE AND SCREEN
ABO/RH(D): O POS
Antibody Screen: NEGATIVE

## 2023-04-17 NOTE — Consult Note (Signed)
[name]  Prenatal Consult      04/17/2023   10:27 PM  I was asked by Dr. [place] Bing to consult on this patient for possible preterm delivery. I had the pleasure of meeting with Cassie Petersen today. This is an update to initial consult on 11/10 by my colleague Dr. Simone Curia, as consult was during her initial triage and on magnesium, thus several questions remained.    She is a 37 yo G51P2022 female currently at [redacted]w[redacted]d with PPROM at [redacted]w[redacted]d. Pregnancy has also been complicated by AMA, bicornate uterus, polyhydramnios, and fetal L pelvic kidney. She has received BMZ x2 (last dose 11/11). Most recent estimated fetal weight of 1161g grams on 11/10 with ultrasound at that time demonstrating oligohydramnios and frank breech presentation. They have chosen to name their baby Cassie Petersen.   We discussed the possible needs for an infant born at this gestation. I explained that the neonatal intensive care team would be present for the delivery and outlined the likely delivery room course for this baby including routine resuscitation and NRP-guided approaches to the treatment of respiratory distress. We discussed the potential need for CPAP or mechanical ventilation and surfactant administration for respiratory distress, IV fluids pending establishment of enteral feeds, antibiotics for possible sepsis, temperature support, and monitoring.   We discussed other common problems associated with prematurity including respiratory distress syndrome/CLD, feeding issues, temperature regulation, and infection risk. I also discussed the potential risk of complications such as intracranial hemorrhage, retinopathy, chronic lung disease, and a range of long term developmental delays. We discussed that overall, the risk of these long term complications is low at 28wks and continues to get lower with additional weeks of gestation.   We discussed how PPROM may result in pulmonary hypoplasia, and how  even with continued weeks of gestation, Jack's lung development may not progress as is typical of fetal development and he may have higher respiratory support needs at birth than infants born at similar gestational ages.   We discussed the importance of good nutrition and various methods of providing nutrition (parenteral hyperalimentation, gavage feedings and/or oral feeding). We discussed the benefits of human milk. I encouraged breast feeding and pumping soon after birth and outlined resources that are available to support breast feeding. We discussed the possibility of using donor breast milk as a bridge, and she expressed the desire to use DBM if needed to supplement MBM.  We discussed the average length of stay but I noted that the actual LOS would depend on the severity of problems encountered and response to treatments. We discussed visitation policies and the resources available while their child is in the hospital. She has her husband and two other young sons at home.   She expressed understanding and agreement with the plan for resuscitation and intensive care. All questions were answered.  Thank you for involving [place] in the care of this patient. A member of our team will be available should the family have additional questions.   Lucillie Garfinkel, MD Neonatal Medicine  I spent ~40 minutes in consultation time, of which 25 minutes was spent in direct face to face counseling.

## 2023-04-17 NOTE — Plan of Care (Signed)
  Problem: Education: Goal: Knowledge of General Education information will improve Description: Including pain rating scale, medication(s)/side effects and non-pharmacologic comfort measures Outcome: Progressing   Problem: Health Behavior/Discharge Planning: Goal: Ability to manage health-related needs will improve Outcome: Progressing   Problem: Clinical Measurements: Goal: Ability to maintain clinical measurements within normal limits will improve Outcome: Progressing Goal: Will remain free from infection Outcome: Progressing Goal: Diagnostic test results will improve Outcome: Progressing   Problem: Activity: Goal: Risk for activity intolerance will decrease Outcome: Progressing   Problem: Nutrition: Goal: Adequate nutrition will be maintained Outcome: Progressing   Problem: Coping: Goal: Level of anxiety will decrease Outcome: Progressing   Problem: Pain Management: Goal: General experience of comfort will improve Outcome: Progressing   Problem: Safety: Goal: Ability to remain free from injury will improve Outcome: Progressing   Problem: Skin Integrity: Goal: Risk for impaired skin integrity will decrease Outcome: Progressing   Problem: Education: Goal: Knowledge of disease or condition will improve Outcome: Progressing Goal: Knowledge of the prescribed therapeutic regimen will improve Outcome: Progressing Goal: Individualized Educational Video(s) Outcome: Progressing   Problem: Clinical Measurements: Goal: Complications related to the disease process, condition or treatment will be avoided or minimized Outcome: Progressing

## 2023-04-17 NOTE — Progress Notes (Signed)
Daily Antepartum Note  Admission Date: 04/13/2023 Current Date: 04/17/2023 11:58 AM  Cassie Petersen is a 37 y.o. [id number] at [redacted]w[redacted]d admitted for PPROM at 27/3.  Pregnancy complicated by: Patient Active Problem List   Diagnosis Date Noted   Preterm premature rupture of membranes (PPROM) with unknown onset of labor 04/14/2023   Suprvsn of high risk preg due to social problems, unsp tri 02/23/2023   AMA (advanced maternal age) multigravida 35+ 12/12/2022   Family history of breast cancer 07/29/2022   PAI-1 4G/4G genotype 06/12/2020   Abnormal Pap smear of cervix 12/02/2019   Bicornuate uterus    Overnight/24hr events:  none  Subjective:  +LOF clear, no VB or contractions  Objective:    Current Vital Signs 24h Vital Sign Ranges  T 98.3 F (36.8 C) Temp  Avg: 98.3 F (36.8 C)  Min: 98.1 F (36.7 C)  Max: 98.4 F (36.9 C)  BP (!) 107/59 BP  Min: 97/57  Max: 107/59  HR 98 Pulse  Avg: 99  Min: 93  Max: 106  RR 18 Resp  Avg: 17.8  Min: 17  Max: 18  SaO2 98 % Room Air SpO2  Avg: 99 %  Min: 97 %  Max: 100 %       24 Hour I/O Current Shift I/O  Time Ins Outs No intake/output data recorded. No intake/output data recorded.  Fetal Heart Tones: 145 baseline, +accels, no decels (?one slight variable), mod variable Tocometry: questionable irregular UCs vs irritability  Physical exam: General: Well nourished, well developed female in no acute distress. Abdomen: gravid nttp Respiratory: no respiratory distress Extremities: no clubbing, cyanosis or edema Neuro: grossly normal  Medications: Current Facility-Administered Medications  Medication Dose Route Frequency Provider Last Rate Last Admin   acetaminophen (TYLENOL) tablet 650 mg  650 mg Oral Q4H PRN Myna Hidalgo, DO       amoxicillin (AMOXIL) capsule 500 mg  500 mg Oral TID Myna Hidalgo, DO   500 mg at 04/17/23 1016   calcium carbonate (TUMS - dosed in mg elemental calcium) chewable tablet 400 mg of elemental calcium  2  tablet Oral Q4H PRN Myna Hidalgo, DO       dextromethorphan-guaiFENesin (MUCINEX DM) 30-600 MG per 12 hr tablet 1 tablet  1 tablet Oral BID Myna Hidalgo, DO   1 tablet at 04/16/23 2229   docusate sodium (COLACE) capsule 100 mg  100 mg Oral Daily Myna Hidalgo, DO   100 mg at 04/17/23 1016   metroNIDAZOLE (FLAGYL) tablet 500 mg  500 mg Oral Q12H Ozan, Jennifer, DO   500 mg at 04/17/23 1016   prenatal multivitamin tablet 1 tablet  1 tablet Oral Q1200 Myna Hidalgo, DO        Labs:  Recent Labs  Lab 04/13/23 0335 04/16/23 1538  WBC 15.0* 11.2*  HGB 12.7 11.0*  HCT 38.0 32.6*  PLT 229 227   Radiology:  No new imaging 11/10: frank breech, oligo, efw 61%, 1161gm, ac 59%  Assessment & Plan:  Patient stable *Pregnancy: reactive NST, fetal status reassuring. Continue with daily NSTs *PPROM: continue latency abx. D/w her re: plan of care, inpatient until delivery, etc *Preterm: consider rescue steroids on 11/25 -s/p BMZ on 11/10 and 11/11, s/p Mg on admit, s/p NICU consult *Breech: d/w her that will re-ultrasound when time for delivery but likely to stay breech given low fluid and bicornuate uterus; if still breech, I told her we recommend c-section.  *PPx: SCDs; hold off on anti-coagulation given  high risk of needing emergency surgery *FEN/GI: regular diet *Dispo: see above.  Cornelia Copa MD Attending Center for [facility] (Faculty Practice) GYN Consult Phone: [phone] (M-F, [phone]) & [phone]  (Off hours, weekends, holidays)

## 2023-04-18 ENCOUNTER — Ambulatory Visit: Payer: Managed Care, Other (non HMO)

## 2023-04-18 MED ORDER — LACTATED RINGERS IV BOLUS
500.0000 mL | Freq: Once | INTRAVENOUS | Status: AC
  Administered 2023-04-18: 500 mL via INTRAVENOUS

## 2023-04-18 NOTE — Progress Notes (Signed)
Maternal-Fetal Medicine  I spoke with Ms. Makhari Despirito on the phone. She had questions on frequency of ultrasound and management. She is G5 P2022 at 28w 1d gestation and was admitted on 04/13/23 with diagnosis of PPROM.  Since admission, she received a course of antenatal corticosteroids and is about to complete oral antibiotic course. She does not have fever or vaginal bleeding and is afebrile.  Significantly, on fetal anatomy scan, left pelvic kidney was seen.  Patient had neonatology consultation and is aware that she will be staying in the hospital till delivery.  I counseled the patient on the following: PPROM -About 70% of the patients go into spontaneous labor within a week. -I discussed the benefit of antibiotics and antenatal corticosteroids.  Antibiotics tend to prolong the latency period. -Complications of PPROM include intra amniotic infections.  Cord prolapse can occur in PPROM. -If expectant management is successful, we will recommend delivery at [redacted] weeks gestation.  Delivery between 34 and [redacted] weeks gestation is possible if patient prefers, and MFM consultation at [redacted] weeks gestation is recommended.  Pelvic kidney I counseled the patient that pelvic kidney is usually hypoplastic can be associated with renal calculus or hydronephrosis and may require postnatal surgery in some cases.  I reassured her that right kidney appears normal.  Only postnatal evaluation will confirm the location of the left kidney (low-lying or pelvic) and its function.  Breech presentation If malpresentation persists, we will recommend cesarean delivery.  Recommendations -Fetal growth assessment on 04/20/23 and then weekly limited ultrasound till 30 weeks" gestation. -Weekly BPP from [redacted] weeks gestation. -Rule out umbilical cord below the presenting part at ultrasound.

## 2023-04-18 NOTE — Progress Notes (Signed)
Pt may shower off monitor per MD

## 2023-04-18 NOTE — Progress Notes (Signed)
Daily Antepartum Note  Admission Date: 04/13/2023 Current Date: 04/18/2023 11:09 AM  Cassie Petersen is a 37 y.o. [id number] at [redacted]w[redacted]d admitted for PPROM at 27/3.  Pregnancy complicated by: Patient Active Problem List   Diagnosis Date Noted   Preterm premature rupture of membranes (PPROM) with unknown onset of labor 04/14/2023   Suprvsn of high risk preg due to social problems, unsp tri 02/23/2023   AMA (advanced maternal age) multigravida 35+ 12/12/2022   Family history of breast cancer 07/29/2022   PAI-1 4G/4G genotype 06/12/2020   Abnormal Pap smear of cervix 12/02/2019   Bicornuate uterus    Overnight/24hr events:  More prominent variables noted this morning during routine NST for fetal surveillance. Patient then voided, repositioned to lateral recumbent and bolus given; patient never felt any contractions  Subjective:  Patient still comfortable and not feeling contractions.   Objective:    Current Vital Signs 24h Vital Sign Ranges  T 98.3 F (36.8 C) Temp  Avg: 98.2 F (36.8 C)  Min: 98 F (36.7 C)  Max: 98.4 F (36.9 C)  BP 107/66 BP  Min: 103/64  Max: 113/63  HR 98 Pulse  Avg: 96  Min: 93  Max: 98  RR 17 Resp  Avg: 17.2  Min: 16  Max: 18  SaO2 96 % Room Air SpO2  Avg: 98.2 %  Min: 96 %  Max: 100 %       24 Hour I/O Current Shift I/O  Time Ins Outs No intake/output data recorded. No intake/output data recorded.  Fetal Heart Tones: 140 baseline, +accels, irregular variables (some slight and some deep) Tocometry: quiet  Physical exam: General: Well nourished, well developed female in no acute distress. Abdomen: gravid nttp Respiratory: no respiratory distress Extremities: no clubbing, cyanosis or edema Neuro: grossly normal  Medications: Current Facility-Administered Medications  Medication Dose Route Frequency Provider Last Rate Last Admin   acetaminophen (TYLENOL) tablet 650 mg  650 mg Oral Q4H PRN Myna Hidalgo, DO       amoxicillin (AMOXIL) capsule  500 mg  500 mg Oral TID Myna Hidalgo, DO   500 mg at 04/17/23 2107   calcium carbonate (TUMS - dosed in mg elemental calcium) chewable tablet 400 mg of elemental calcium  2 tablet Oral Q4H PRN Myna Hidalgo, DO       dextromethorphan-guaiFENesin (MUCINEX DM) 30-600 MG per 12 hr tablet 1 tablet  1 tablet Oral BID Myna Hidalgo, DO   1 tablet at 04/17/23 2107   docusate sodium (COLACE) capsule 100 mg  100 mg Oral Daily Myna Hidalgo, DO   100 mg at 04/17/23 1016   metroNIDAZOLE (FLAGYL) tablet 500 mg  500 mg Oral Q12H Myna Hidalgo, DO   500 mg at 04/17/23 2107   prenatal multivitamin tablet 1 tablet  1 tablet Oral Q1200 Myna Hidalgo, DO        Labs:  No new labs  Radiology:  No new imaging 11/10: frank breech, oligo, efw 61%, 1161gm, ac 59%  Assessment & Plan:  Patient stable *Pregnancy: +accels with category II tracing due to variables. Continue EFM for now. Will have MFM talk to patient re: fetal pelvic kidney, variables and overall plan of care *PPROM: continue latency abx. *Preterm: consider rescue steroids on 11/25 -s/p BMZ on 11/10 and 11/11, s/p Mg on admit, s/p NICU consult *Breech: already consented and d/w her re: indications for c-section *Fetal pelvic kidney: will have patient d/w MFM.  *History of low lying placenta: I d/w MFM  and placenta looks clear of cervix.  *PPx: SCDs; hold off on anti-coagulation given high risk of needing emergency surgery *FEN/GI: regular diet *Dispo: see above.  Cornelia Copa MD Attending Center for [facility] (Faculty Practice) GYN Consult Phone: [phone] (M-F, [phone]) & [phone]  (Off hours, weekends, holidays)

## 2023-04-19 DIAGNOSIS — O42913 Preterm premature rupture of membranes, unspecified as to length of time between rupture and onset of labor, third trimester: Secondary | ICD-10-CM

## 2023-04-19 DIAGNOSIS — Z3A28 28 weeks gestation of pregnancy: Secondary | ICD-10-CM

## 2023-04-19 NOTE — Progress Notes (Addendum)
FACULTY PRACTICE ANTEPARTUM(COMPREHENSIVE) NOTE  Cassie Petersen is a 37 y.o. [id number] with Estimated Date of Delivery: 07/10/23   By  LMP [redacted]w[redacted]d  who is admitted for PPROM.    Fetal presentation is breech. Length of Stay:  6  Days  Date of admission:04/13/2023  Subjective: Resting comfortably, no acute complaints.  Denies fever/chills.   Patient reports the fetal movement as active. Patient reports uterine contraction  activity as none.  She notes that she is still leaking with scant brown discharge, which is what it has always been.  Denies BRB.    Of note pt has noticed that when her bladder is full she may have the variables.  Vitals:  Blood pressure (!) 98/56, pulse (!) 101, temperature 98.2 F (36.8 C), temperature source Oral, resp. rate 18, height 5\' 8"  (1.727 m), weight 62.1 kg, last menstrual period 10/03/2022, SpO2 98%. Vitals:   04/18/23 1142 04/18/23 1629 04/18/23 2239 04/19/23 0754  BP: (!) 99/52 111/66 111/62 (!) 98/56  Pulse: 95 98 (!) 107 (!) 101  Resp: 18 19 18 18   Temp: 98.3 F (36.8 C) 98.3 F (36.8 C) 98.3 F (36.8 C) 98.2 F (36.8 C)  TempSrc: Oral Oral Oral Oral  SpO2: 98% 99% 99% 98%  Weight:      Height:       Physical Examination:  General appearance - alert, well appearing, and in no distress Mental status - normal mood, behavior, speech, dress, motor activity, and thought processes Chest - clear to auscultation, no wheezes, rales or rhonchi, symmetric air entry Heart - normal rate and regular rhythm Abdomen - gravid, soft and non-tender Musculoskeletal - no muscular tenderness noted Extremities - no edema, no evidence of DVT Skin - warm and dry   Fetal Monitoring:  Baseline: 145 bpm, Variability: moderate, Accelerations: +10x0 accels x 2, and Decelerations: occasional variable decel noted  Appropriate NST for current gestational age  Labs:  CBC    Component Value Date/Time   WBC 11.2 (H) 04/16/2023 1538   RBC 3.49 (L) 04/16/2023 1538    HGB 11.0 (L) 04/16/2023 1538   HGB 12.5 04/04/2023 0907   HCT 32.6 (L) 04/16/2023 1538   HCT 36.4 04/04/2023 0907   PLT 227 04/16/2023 1538   PLT 182 04/04/2023 0907   MCV 93.4 04/16/2023 1538   MCV 94 04/04/2023 0907   MCH 31.5 04/16/2023 1538   MCHC 33.7 04/16/2023 1538   RDW 12.7 04/16/2023 1538   RDW 12.6 04/04/2023 0907   LYMPHSABS 2.2 12/12/2022 1054   EOSABS 0.1 12/12/2022 1054   BASOSABS 0.1 12/12/2022 1054      Imaging Studies:    11/10: frank breech, oligo, efw 61%, 1161gm, ac 59%   Medications:  Scheduled  amoxicillin  500 mg Oral TID   dextromethorphan-guaiFENesin  1 tablet Oral BID   metroNIDAZOLE  500 mg Oral Q12H   prenatal multivitamin  1 tablet Oral Q1200   I have reviewed the patient's current medications.  ASSESSMENT: [id number] [redacted]w[redacted]d Estimated Date of Delivery: 07/10/23  Patient Active Problem List   Diagnosis Date Noted   Preterm premature rupture of membranes (PPROM) with unknown onset of labor 04/14/2023   Suprvsn of high risk preg due to social problems, unsp tri 02/23/2023   AMA (advanced maternal age) multigravida 35+ 12/12/2022   Family history of breast cancer 07/29/2022   PAI-1 4G/4G genotype 06/12/2020   Abnormal Pap smear of cervix 12/02/2019   Bicornuate uterus    -PPROM -High risk pregnancy @  [redacted]w[redacted]d -AMA -Bicornuate uterus -Breech presentation  PLAN: 1) Fetal well being -Cat 2 tracing, appropriate for current gestational age.  Variable noted on occasion and resolved with voiding and positional change -currently on continuous monitoring -s/p BMZ on 11/10-11 and Magnesium -s/p NICU consult -last growth 11/10, will continue serial scans every 3-4wks []  consider rescue course of steroids  2) PPROM -no evidence of infection or labor -on latency antibiotics  3) Breech presentation -consented for C-section -pt interested in ECV- reviewed risk/benefits and discussed that if AFI low risk typically outweigh benefit.  Pending AFI and  timing of delivery may review further  4) Maternal care -PNV daily -regular diet -activity as tolerated -SCDs for DVT prophylaxis   DISP: Continue inpatient management as above.  Timing of delivery pending maternal/fetal status-goal of delivery @ 34wks.   Long discussion with patient today regarding monitoring, timing of delivery and expectations during hospital stay.   Myna Hidalgo, DO Attending Obstetrician & Gynecologist, [facility] for [employer], [facility]

## 2023-04-20 MED ORDER — METRONIDAZOLE 500 MG PO TABS
500.0000 mg | ORAL_TABLET | Freq: Once | ORAL | Status: AC
  Administered 2023-04-20: 500 mg via ORAL
  Filled 2023-04-20: qty 1

## 2023-04-20 NOTE — Progress Notes (Signed)
Patient ID: Cassie Petersen, female   DOB: 22-Feb-1986, 37 y.o.   MRN: [id number] Called about fetal heart rate monitoring. She has a reassuring tracing Category 2 with moderate variability and + accels and a few mild variable, which improved with position change. Ok to come off the monitor.

## 2023-04-20 NOTE — Progress Notes (Signed)
FACULTY PRACTICE ANTEPARTUM(COMPREHENSIVE) NOTE  Cassie Petersen is a 37 y.o. [id number] with Estimated Date of Delivery: 07/10/23   By  LMP [redacted]w[redacted]d  who is admitted PPROM  Fetal presentation is breech. Length of Stay:  7  Days  Date of admission:04/13/2023  Subjective: Patient resting comfortably in bed.  Reports no acute complaints overnight.  She was having some green discharge that has since resolved.  Based on history sounds like episode of meconium. Patient reports the fetal movement as active. Patient reports uterine contraction  activity as none. Patient reports  vaginal bleeding as none.  Vitals:  Blood pressure (!) 93/51, pulse 93, temperature 98.3 F (36.8 C), resp. rate 17, height 5\' 8"  (1.727 m), weight 62.1 kg, last menstrual period 10/03/2022, SpO2 98%. Vitals:   04/19/23 1154 04/19/23 1535 04/19/23 1920 04/20/23 0749  BP: 100/61 (!) 116/59 (!) 111/59 (!) 93/51  Pulse: 97 98 94 93  Resp: 18 18 18 17   Temp: 98.2 F (36.8 C) 98.8 F (37.1 C) 98.8 F (37.1 C) 98.3 F (36.8 C)  TempSrc: Oral Oral Oral   SpO2: 95% 100% 95% 98%  Weight:      Height:       Physical Examination:  General appearance - alert, well appearing, and in no distress Mental status - normal mood, behavior, speech, dress, motor activity, and thought processes Chest - CTAB Heart - normal rate and regular rhythm Abdomen - gravid, soft and non-tender Musculoskeletal - no calf tenderness bilaterally Extremities -no edema Skin - warm and dry   Fetal Monitoring:  Baseline: 145 bpm, Variability: moderate, Accelerations: +15x15 present, and Decelerations: Absent    reactive  Labs:  No results found for this or any previous visit (from the past 24 hour(s)).  Imaging Studies:    No results found.   Medications:  Scheduled  dextromethorphan-guaiFENesin  1 tablet Oral BID   metroNIDAZOLE  500 mg Oral Q12H   prenatal multivitamin  1 tablet Oral Q1200   I have reviewed the patient's current  medications.  ASSESSMENT: [id number] [redacted]w[redacted]d Estimated Date of Delivery: 07/10/23  PPROM AMA Bicornuate uterus Breech presentation High risk pregnancy @ [redacted]w[redacted]d Bacterial vaginosis  PLAN: 1) Fetal well being -Cat. 1 tracing, reassuring for current gestational age -currently on NST q shift -s/p BMZ 11/10-11 and magnesium -S/p NICU consult -Last growth scan 11/10, continue serial scans every 3 to 4 weeks []  Consideration for rescue steroids when closer to delivery  2) PPROM next -No evidence of infection or labor -Status post latency antibiotics  3) maternal care -PNV daily, regular diet -BV: On metronidazole x 7 days -Activity as tolerated -SCDs for DVT prophylaxis  4) breech presentation -Consented for C-section  DISP: Continue inpatient management as above.  Timing of delivery pending maternal/fetal status with a goal of delivery at 34 weeks.  Myna Hidalgo, DO Attending Obstetrician & Gynecologist, [facility] for [employer], [facility]

## 2023-04-20 NOTE — Plan of Care (Signed)
  Problem: Health Behavior/Discharge Planning: Goal: Ability to manage health-related needs will improve Outcome: Progressing   Problem: Clinical Measurements: Goal: Will remain free from infection Outcome: Progressing   Problem: Coping: Goal: Level of anxiety will decrease Outcome: Progressing   

## 2023-04-21 DIAGNOSIS — O42113 Preterm premature rupture of membranes, onset of labor more than 24 hours following rupture, third trimester: Secondary | ICD-10-CM

## 2023-04-21 MED ORDER — DM-GUAIFENESIN ER 30-600 MG PO TB12: 1.0000 | ORAL_TABLET | Freq: Two times a day (BID) | ORAL | Status: DC | PRN

## 2023-04-21 NOTE — Progress Notes (Signed)
Patient ID: Cassie Petersen, female   DOB: [birth year], 37 y.o.   MRN: [id number] FACULTY PRACTICE ANTEPARTUM(COMPREHENSIVE) NOTE  Cassie Petersen is a 37 y.o. [id number] with Estimated Date of Delivery: 07/10/23   By  LMP [redacted]w[redacted]d  who is admitted PPROM  Fetal presentation is breech. Length of Stay:  8  Days  Date of admission:04/13/2023  Subjective: Patient resting comfortably in bed.  Reports no acute complaints overnight.  Patient reports good fetal movement, she denies vaginal bleeding or contractions  Vitals:  Blood pressure (!) 93/56, pulse 94, temperature 97.8 F (36.6 C), temperature source Oral, resp. rate 18, height 5\' 8"  (1.727 m), weight 62.1 kg, last menstrual period 10/03/2022, SpO2 99%. Vitals:   04/20/23 1142 04/20/23 1547 04/20/23 1954 04/21/23 0725  BP: (!) 98/55 (!) 109/56 114/75 (!) 93/56  Pulse: (!) 106 88 (!) 112 94  Resp: 18 18 18 18   Temp: 98.1 F (36.7 C) 98 F (36.7 C) 98.2 F (36.8 C) 97.8 F (36.6 C)  TempSrc: Oral Oral Oral Oral  SpO2: 99% 98% 96% 99%  Weight:      Height:       Physical Examination: GENERAL: Well-developed, well-nourished female in no acute distress.  LUNGS: Clear to auscultation bilaterally.  HEART: Regular rate and rhythm. ABDOMEN: Soft, nontender, gravid PELVIC: Not indicated EXTREMITIES: No cyanosis, clubbing, or edema, 2+ distal pulses.    Fetal Monitoring:  Baseline: 145 bpm, Variability: moderate, Accelerations: +10x10 present, and Decelerations: Absent    reactive  Labs:  No results found for this or any previous visit (from the past 24 hour(s)).  Imaging Studies:    No results found.   Medications:  Scheduled  dextromethorphan-guaiFENesin  1 tablet Oral BID   prenatal multivitamin  1 tablet Oral Q1200   I have reviewed the patient's current medications.  ASSESSMENT: [id number] [redacted]w[redacted]d Estimated Date of Delivery: 07/10/23  PPROM AMA Bicornuate uterus Breech presentation   PLAN: - Fetal tracing reassuring for  current gestational age - s/p BMZ 11/10-11 and magnesium - S/p NICU consult - Last growth scan 11/10, continue serial scans every 3 to 4 weeks -No evidence of infection or labor -Status post latency antibiotics - breech presentation -Consented for C-section - Continue inpatient management as above.  Timing of delivery pending maternal/fetal status with a goal of delivery at 34 weeks.  Catalina Antigua Attending Obstetrician & Gynecologist, [facility] for [employer], [facility]

## 2023-04-21 NOTE — Progress Notes (Signed)
Initial Nutrition Assessment  DOCUMENTATION CODES:  Not applicable  INTERVENTION:  Regular Diet Pt may order double protein portions and snacks TID if she makes request when ordering meals   NUTRITION DIAGNOSIS:  Increased nutrient needs related to  (pregnancy and fetal growth requirements) as evidenced by  (28 weeks IUP).   GOAL:  Patient will meet greater than or equal to 90% of their needs, Weight gain   MONITOR:  Weight trends  REASON FOR ASSESSMENT:  Antenatal, LOS   ASSESSMENT:  37 yo adm due to PROM. Weight at initial prenatal visit/ 10 weeks: 56.2 kg, bMI 18.8. Weight gain of 5.9 kg. S/P BMZ on 11/0-11/11. On prenatal vits. Labs reviewed. Will be inpt until delivery or 34 weeks   Diet Order:   Diet Order             Diet regular Room service appropriate? Yes; Fluid consistency: Thin  Diet effective now                   EDUCATION NEEDS:   No education needs have been identified at this time  Skin:  Skin Assessment: Reviewed RN Assessment  Last BM:  11/16  Height:   Ht Readings from Last 1 Encounters:  04/13/23 5\' 8"  (1.727 m)   Weight:   Wt Readings from Last 1 Encounters:  04/13/23 62.1 kg   Ideal Body Weight:   140 lbs  BMI:  Body mass index is 20.83 kg/m.  Estimated Nutritional Needs:   Kcal:  2100-2300  Protein:  95-105 g  Fluid:  >2.1 L

## 2023-04-22 NOTE — Progress Notes (Signed)
Patient ID: Cassie Petersen, female   DOB: February 01, 1986, 37 y.o.   MRN: [id number] FACULTY PRACTICE ANTEPARTUM(COMPREHENSIVE) NOTE  Cassie Petersen is a 37 y.o. [id number] with Estimated Date of Delivery: 07/10/23   By  LMP [redacted]w[redacted]d  who is admitted PPROM  Fetal presentation is breech. Length of Stay:  9  Days  Date of admission:04/13/2023  Subjective: Patient sitting in recliner chair by the window.  Reports no acute complaints overnight.  Patient reports good fetal movement, she denies vaginal bleeding or contractions  Vitals:  Blood pressure (!) 99/59, pulse 96, temperature 97.9 F (36.6 C), temperature source Oral, resp. rate 18, height 5\' 8"  (1.727 m), weight 62.1 kg, last menstrual period 10/03/2022, SpO2 97%. Vitals:   04/21/23 1500 04/21/23 1905 04/21/23 2310 04/22/23 0837  BP: (!) 110/56 117/65 111/68 (!) 99/59  Pulse: 100 (!) 114 (!) 105 96  Resp: 18 17 18 18   Temp: 98.1 F (36.7 C) 98.6 F (37 C) 98.2 F (36.8 C) 97.9 F (36.6 C)  TempSrc: Oral Oral Oral Oral  SpO2: 98% 97% 98% 97%  Weight:      Height:       Physical Examination: GENERAL: Well-developed, well-nourished female in no acute distress.  LUNGS: Clear to auscultation bilaterally.  HEART: Regular rate and rhythm. ABDOMEN: Soft, nontender, gravid PELVIC: Not indicated EXTREMITIES: No cyanosis, clubbing, or edema, 2+ distal pulses.    Fetal Monitoring:  Baseline: 145 bpm, Variability: moderate, Accelerations: +15x15 present, and Decelerations: Absent    reactive  Labs:  No results found for this or any previous visit (from the past 24 hour(s)).  Imaging Studies:    No results found.   Medications:  Scheduled  prenatal multivitamin  1 tablet Oral Q1200   I have reviewed the patient's current medications.  ASSESSMENT: [id number] [redacted]w[redacted]d Estimated Date of Delivery: 07/10/23  PPROM AMA Bicornuate uterus Breech presentation   PLAN: - Fetal tracing reassuring for current gestational age - s/p BMZ 11/10-11  and magnesium - Consider rescue BMZ dose week of 11/25 - S/p NICU consult - Last growth scan 11/10, continue serial scans every 3 to 4 weeks -No evidence of infection or labor -Status post latency antibiotics - breech presentation -Consented for C-section - Continue inpatient management as above.  Timing of delivery pending maternal/fetal status with a goal of delivery at 34 weeks.  Catalina Antigua Attending Obstetrician & Gynecologist, [facility] for [employer], [facility]

## 2023-04-23 MED ORDER — DOCUSATE SODIUM 100 MG PO CAPS
100.0000 mg | ORAL_CAPSULE | Freq: Two times a day (BID) | ORAL | Status: DC | PRN
  Administered 2023-04-23: 100 mg via ORAL
  Filled 2023-04-23: qty 1

## 2023-04-23 MED ORDER — SODIUM CHLORIDE 0.9% FLUSH
3.0000 mL | Freq: Two times a day (BID) | INTRAVENOUS | Status: DC
  Administered 2023-04-23 – 2023-05-01 (×17): 3 mL via INTRAVENOUS

## 2023-04-23 NOTE — Progress Notes (Signed)
Patient ID: Cassie Petersen, female   DOB: [birth year], 37 y.o.   MRN: [id number]  FACULTY PRACTICE ANTEPARTUM(COMPREHENSIVE) NOTE  Cassie Petersen is a 37 y.o. [id number] with Estimated Date of Delivery: 07/10/23   By  LMP [redacted]w[redacted]d  who is admitted PPROM  Fetal presentation is breech. Length of Stay:  10  Days  Date of admission:04/13/2023  Subjective: Patient is doing well without complaints overnight.  Patient reports good fetal movement, she denies vaginal bleeding or contractions  Vitals:  Blood pressure 105/61, pulse 100, temperature 98 F (36.7 C), temperature source Oral, resp. rate 18, height 5\' 8"  (1.727 m), weight 62.1 kg, last menstrual period 10/03/2022, SpO2 95%. Vitals:   04/22/23 1557 04/22/23 1912 04/22/23 2332 04/23/23 0746  BP: (!) 101/54 100/61 105/61 105/61  Pulse: (!) 101 97 (!) 105 100  Resp: 18 18 17 18   Temp: 98.1 F (36.7 C) 98.3 F (36.8 C) 98.1 F (36.7 C) 98 F (36.7 C)  TempSrc: Oral Oral Oral Oral  SpO2: 97% 98%  95%  Weight:      Height:       Physical Examination: GENERAL: Well-developed, well-nourished female in no acute distress.  LUNGS: Clear to auscultation bilaterally.  HEART: Regular rate and rhythm. ABDOMEN: Soft, nontender, gravid PELVIC: Not indicated EXTREMITIES: No cyanosis, clubbing, or edema, 2+ distal pulses.    Fetal Monitoring:  Baseline: 145 bpm, Variability: moderate, Accelerations: +15x15 present, and Decelerations: Absent    reactive  Labs:  No results found for this or any previous visit (from the past 24 hour(s)).  Imaging Studies:    No results found.   Medications:  Scheduled  prenatal multivitamin  1 tablet Oral Q1200   sodium chloride flush  3 mL Intravenous Q12H   I have reviewed the patient's current medications.  ASSESSMENT: [id number] [redacted]w[redacted]d Estimated Date of Delivery: 07/10/23  PPROM AMA Bicornuate uterus Breech presentation   PLAN: - Fetal tracing reassuring for current gestational age - s/p BMZ  11/10-11 and magnesium - Consider rescue BMZ dose week of 11/25 - S/p NICU consult - Last growth scan 11/10, continue serial scans every 3 to 4 weeks - Status post latency antibiotics - breech presentation and consented for C-section - Continue inpatient management as above.  Timing of delivery pending signs/symptoms of chorioamnionitis with a goal of delivery at 34 weeks.  Catalina Antigua Attending Obstetrician & Gynecologist, [facility] for [employer], [facility]

## 2023-04-24 DIAGNOSIS — Z3A29 29 weeks gestation of pregnancy: Secondary | ICD-10-CM

## 2023-04-24 NOTE — Progress Notes (Signed)
FACULTY PRACTICE ANTEPARTUM PROGRESS NOTE  Cassie Petersen is a 37 y.o. [id number] at [redacted]w[redacted]d who is admitted for PPROM.  Estimated Date of Delivery: 07/10/23 Fetal presentation is breech.  Length of Stay:  11 Days. Admitted 04/13/2023  Subjective: Pt is doing well. No acute complaints.  Pt di have some questions regarding  cesarean delivery. Patient reports normal fetal movement.  She denies uterine contractions, denies bleeding.  There is continued leaking of fluid per vagina.  Pt denies fever/chills as well as abdominal pain.  Vitals:  Blood pressure 105/70, pulse (!) 115, temperature 98.2 F (36.8 C), temperature source Oral, resp. rate 16, height 5\' 8"  (1.727 m), weight 62.1 kg, last menstrual period 10/03/2022, SpO2 100%. Physical Examination: CONSTITUTIONAL: Well-developed, well-nourished female in no acute distress.  HENT:  Normocephalic, atraumatic, External right and left ear normal. Oropharynx is clear and moist EYES: Conjunctivae and EOM are normal.  NECK: Normal range of motion, supple, no masses. SKIN: Skin is warm and dry. No rash noted. Not diaphoretic. No erythema. No pallor. NEUROLGIC: Alert and oriented to person, place, and time. Normal reflexes, muscle tone coordination. No cranial nerve deficit noted. PSYCHIATRIC: Normal mood and affect. Normal behavior. Normal judgment and thought content. CARDIOVASCULAR: Normal heart rate noted, regular rhythm RESPIRATORY: Effort and breath sounds normal, no problems with respiration noted MUSCULOSKELETAL: Normal range of motion. No edema and no tenderness. ABDOMEN: Soft, nontender, nondistended, gravid. CERVIX: deferred  Fetal monitoring: FHR: 140s-150s bpm, Variability: moderate, Accelerations: Present, Decelerations: occasional small variable decel Uterine activity: none  Category 1 strip, appropriate for gestational age. No results found for this or any previous visit (from the past 48 hour(s)).  I have reviewed the patient's  current medications.  ASSESSMENT: Principal Problem:   Preterm premature rupture of membranes (PPROM) with unknown onset of labor   PLAN: Fetal tracing reassuring. S/p BMZ 11/10 and 11/11 with magnesium sulfate.  Can consider rescue steroids if greater than 2 weeks from previous administration. -s/p NICU consult -growth scan q 3-4  weeks , consider next scan around 05/05/23 S/p latency abx Currently breech presentation, pt consented for cesarean section -delivery at 34 weeks or with increased concern regarding materna/fetal stability or chorioamnionitis.    Continue routine antenatal care.   Mariel Aloe, MD [facility] Faculty Attending, [facility] 04/24/2023 9:15 AM

## 2023-04-25 LAB — CBC
HCT: 34.1 % — ABNORMAL LOW (ref 36.0–46.0)
Hemoglobin: 11.2 g/dL — ABNORMAL LOW (ref 12.0–15.0)
MCH: 30.3 pg (ref 26.0–34.0)
MCHC: 32.8 g/dL (ref 30.0–36.0)
MCV: 92.2 fL (ref 80.0–100.0)
Platelets: 188 10*3/uL (ref 150–400)
RBC: 3.7 MIL/uL — ABNORMAL LOW (ref 3.87–5.11)
RDW: 13.2 % (ref 11.5–15.5)
WBC: 12.2 10*3/uL — ABNORMAL HIGH (ref 4.0–10.5)
nRBC: 0 % (ref 0.0–0.2)

## 2023-04-25 LAB — TYPE AND SCREEN
ABO/RH(D): O POS
Antibody Screen: NEGATIVE

## 2023-04-25 NOTE — Progress Notes (Signed)
FACULTY PRACTICE ANTEPARTUM(COMPREHENSIVE) NOTE  Cassie Petersen is a 37 y.o. [id number] with Estimated Date of Delivery: 07/10/23   By  LMP [redacted]w[redacted]d  who is admitted for PPROM.    Fetal presentation is breech. Length of Stay:  12  Days  Date of admission:04/13/2023  Subjective: Resting comfortably, no acute complaints overnight. Patient reports the fetal movement as active. Patient reports uterine contraction  activity as none. Patient reports  vaginal bleeding as none. Patient describes fluid per vagina as Clear.  Vitals:  Blood pressure 113/75, pulse 95, temperature 98.4 F (36.9 C), temperature source Oral, resp. rate 18, height 5\' 8"  (1.727 m), weight 62.1 kg, last menstrual period 10/03/2022, SpO2 98%. Vitals:   04/24/23 0821 04/24/23 1135 04/24/23 1530 04/24/23 1927  BP: 105/70 (!) 100/56 (!) 99/58 113/75  Pulse: (!) 115 99 84 95  Resp: 16 17 18 18   Temp: 98.2 F (36.8 C) 98.3 F (36.8 C) 98.1 F (36.7 C) 98.4 F (36.9 C)  TempSrc: Oral Oral Oral Oral  SpO2: 100% 96% 98% 98%  Weight:      Height:       Physical Examination:  General appearance - alert, well appearing, and in no distress Mental status - normal mood, behavior, speech, dress, motor activity, and thought processes Chest - normal respiratory effort Heart - normal rate and regular rhythm Abdomen - gravid, soft and non-tender Musculoskeletal - no calf tenderness bilaterally Extremities - no edema Skin - warm and dry   Fetal Monitoring:  Baseline: 135 bpm, Variability: moderate, Accelerations: +15x15 accels x2, and Decelerations: Absent    reactive  Labs:  No results found for this or any previous visit (from the past 24 hour(s)).  Imaging Studies:    No results found.   Medications:  Scheduled  prenatal multivitamin  1 tablet Oral Q1200   sodium chloride flush  3 mL Intravenous Q12H   I have reviewed the patient's current medications.  ASSESSMENT: [id number] [redacted]w[redacted]d Estimated Date of Delivery: 07/10/23    PPROM AMA Bicornuate uterus Breech presentation  PLAN: 1) PPROM -s/p latency antibiotics, BMZ 11/10-11 and Magnesium -no evidence of infection or labor -s/p NICU consult -last growth scan 11/10, continue serial scans q 3-4wks -consider rescue steroids closer to delivery  2) Fetal well being -reactive NST, continue twice daily monitoring -breech presentation, plan for primary C-section- consent in chart  3) Maternal care -s/p BV treatment -routine antenatal care -SCDs for DVT prophylaxis   DISP: Continue inpatient care as outlined above.  Timing of delivery pending maternal/fetal status with goal of delivery @ 34wk.  Pt currently remains stable  Alessandra Bevels Eliaz Fout 04/25/2023,7:33 AM

## 2023-04-26 NOTE — Progress Notes (Signed)
FACULTY PRACTICE ANTEPARTUM(COMPREHENSIVE) NOTE  Cassie Petersen is a 37 y.o. [id number] at [redacted]w[redacted]d  who is admitted for PPROM.    Fetal presentation is breech. Length of Stay:  13  Days  Date of admission:04/13/2023  Subjective: Resting comfortably, no acute complaints overnight. Patient reports the fetal movement as active. Patient reports uterine contraction  activity as none. Patient reports  vaginal bleeding as none. Patient describes fluid per vagina as Clear.  Vitals:  Blood pressure (!) 102/58, pulse (!) 102, temperature 98.7 F (37.1 C), temperature source Oral, resp. rate 20, height 5\' 8"  (1.727 m), weight 62.1 kg, last menstrual period 10/03/2022, SpO2 98%. Vitals:   04/25/23 1524 04/25/23 1915 04/25/23 2339 04/26/23 0733  BP: (!) 106/53 122/68 120/66 (!) 102/58  Pulse: 90 96 (!) 102 (!) 102  Resp: 18 18 18 20   Temp: 98.6 F (37 C) 98.4 F (36.9 C) 98.4 F (36.9 C) 98.7 F (37.1 C)  TempSrc: Oral Oral Oral Oral  SpO2: 97% 97% 98% 98%  Weight:      Height:       Physical Examination: General appearance - alert, well appearing, and in no distress Mental status - normal mood, behavior, speech, dress, motor activity, and thought processes Chest - normal respiratory effort Heart - normal rate and regular rhythm Abdomen - gravid, soft and non-tender Musculoskeletal - no calf tenderness bilaterally Extremities - no edema Skin - warm and dry   Fetal Monitoring:  Baseline: 135 bpm, Variability: moderate, Accelerations: +15x15 accels x2, and Decelerations: Absent    reactive  Labs:  No results found for this or any previous visit (from the past 24 hour(s)).  Imaging Studies:    No results found.   Medications:  Scheduled  prenatal multivitamin  1 tablet Oral Q1200   sodium chloride flush  3 mL Intravenous Q12H   I have reviewed the patient's current medications.  ASSESSMENT: [id number] [redacted]w[redacted]d Estimated Date of Delivery: 07/10/23   PPROM AMA Bicornuate  uterus Breech presentation [redacted] weeks gestation  PLAN: 1) PPROM -s/p latency antibiotics, BMZ 11/10-11 and Magnesium -no evidence of infection or labor -s/p NICU consult -last growth scan 11/10, continue serial scans q 3-4wks -consider rescue steroids closer to delivery  2) Fetal well being -reactive NST, continue twice daily monitoring -breech presentation, plan for primary C-section- consent in chart  3) Maternal care -s/p BV treatment -routine antenatal care -SCDs for DVT prophylaxis   DISP: Continue inpatient care as outlined above.  Timing of delivery pending maternal/fetal status with goal of delivery @ 34wk.  Pt currently remains stable  Jaynie Collins, MD 04/26/2023,8:13 AM

## 2023-04-27 DIAGNOSIS — Z3A29 29 weeks gestation of pregnancy: Secondary | ICD-10-CM

## 2023-04-27 NOTE — Progress Notes (Signed)
FACULTY PRACTICE ANTEPARTUM(COMPREHENSIVE) NOTE  Cassie Petersen is a 37 y.o. [id number] at [redacted]w[redacted]d  who is admitted for PPROM.    Fetal presentation is breech. Length of Stay:  14  Days  Date of admission:04/13/2023  Subjective: Resting comfortably, no acute complaints overnight. Patient reports the fetal movement as active. Patient reports uterine contraction  activity as none. Patient reports  vaginal bleeding as none. Patient describes fluid per vagina as Clear.  Vitals:  Blood pressure (!) 121/58, pulse 95, temperature 97.9 F (36.6 C), temperature source Oral, resp. rate 18, height 5\' 8"  (1.727 m), weight 62.1 kg, last menstrual period 10/03/2022, SpO2 96%. Vitals:   04/26/23 1121 04/26/23 1536 04/26/23 1934 04/26/23 2210  BP: (!) 97/55 118/66 124/70 (!) 121/58  Pulse: 90 98 (!) 102 95  Resp: 20 18 18 18   Temp: 98.7 F (37.1 C) 98.5 F (36.9 C) 98.5 F (36.9 C) 97.9 F (36.6 C)  TempSrc: Oral Oral Oral Oral  SpO2: 97% 98% 99% 96%  Weight:      Height:       Physical Examination: General appearance - alert, well appearing, and in no distress Mental status - normal mood, behavior, speech, dress, motor activity, and thought processes Chest - normal respiratory effort, no respiratory distress noted Heart - normal rate and regular rhythm Abdomen - gravid, soft and non-tender Musculoskeletal - no calf tenderness bilaterally Extremities - no edema  Fetal Monitoring:  Baseline: 135 bpm, Variability: moderate, Accelerations: Present, and Decelerations: Absent    Reactive and reassuring for gestational age  Labs:     Latest Ref Rng & Units 04/25/2023    6:59 AM 04/16/2023    3:38 PM 04/13/2023    3:35 AM  CBC  WBC 4.0 - 10.5 K/uL 12.2  11.2  15.0   Hemoglobin 12.0 - 15.0 g/dL 78.2  95.6  21.3   Hematocrit 36.0 - 46.0 % 34.1  32.6  38.0   Platelets 150 - 400 K/uL 188  227  229     Imaging Studies:    No results found.   Medications:  Scheduled  prenatal  multivitamin  1 tablet Oral Q1200   sodium chloride flush  3 mL Intravenous Q12H   I have reviewed the patient's current medications.  ASSESSMENT:  PPROM AMA Bicornuate uterus Breech presentation [redacted] weeks gestation  PLAN: 1) PPROM -Already received latency antibiotics, BMZ 11/10-11/11 and Magnesium -No current evidence of infection or labor -NICU consult has been done on 11/10 -Last growth scan 11/10, continue serial scans q 3-4wks -Continue q72h CBC checks, T&S updates -Consider rescue steroids closer to delivery  2) Fetal well being -Reactive NSTs over the day,, continue twice daily monitoring -Breech presentation, plan for primary C-section- consent in chart  3) Maternal care -routine antenatal care   DISP: Continue inpatient care as outlined above.  Timing of delivery pending maternal/fetal status with goal of delivery at [redacted] weeks gestation.  Patient currently remains stable   Jaynie Collins, MD, FACOG Obstetrician & Gynecologist, [facility] for [employer], [facility]

## 2023-04-27 NOTE — Plan of Care (Signed)
  Problem: Health Behavior/Discharge Planning: Goal: Ability to manage health-related needs will improve Outcome: Progressing   Problem: Clinical Measurements: Goal: Will remain free from infection Outcome: Progressing   

## 2023-04-28 ENCOUNTER — Encounter: Payer: Managed Care, Other (non HMO) | Admitting: Obstetrics & Gynecology

## 2023-04-28 LAB — CBC
HCT: 36.2 % (ref 36.0–46.0)
Hemoglobin: 12.5 g/dL (ref 12.0–15.0)
MCH: 31.6 pg (ref 26.0–34.0)
MCHC: 34.5 g/dL (ref 30.0–36.0)
MCV: 91.6 fL (ref 80.0–100.0)
Platelets: 170 10*3/uL (ref 150–400)
RBC: 3.95 MIL/uL (ref 3.87–5.11)
RDW: 13.2 % (ref 11.5–15.5)
WBC: 11.9 10*3/uL — ABNORMAL HIGH (ref 4.0–10.5)
nRBC: 0 % (ref 0.0–0.2)

## 2023-04-28 NOTE — Progress Notes (Signed)
FACULTY PRACTICE ANTEPARTUM(COMPREHENSIVE) NOTE  Cassie Petersen is a 37 y.o. [id number] at [redacted]w[redacted]d  who is admitted for PPROM.    Fetal presentation is breech. Length of Stay:  15  Days  Date of admission:04/13/2023  Subjective: Resting comfortably, no acute complaints overnight. Patient reports the fetal movement as active. Patient reports uterine contraction  activity as none. Patient reports  vaginal bleeding as none. Patient describes fluid per vagina as Clear.  Vitals:  Blood pressure (!) 97/56, pulse 96, temperature 98.5 F (36.9 C), temperature source Oral, resp. rate 19, height 5\' 8"  (1.727 m), weight 62.1 kg, last menstrual period 10/03/2022, SpO2 98%. Vitals:   04/27/23 1611 04/27/23 1917 04/27/23 2139 04/28/23 0808  BP: 111/60 103/73 109/64 (!) 97/56  Pulse: 92 88 89 96  Resp: 19 20 20 19   Temp: 98.3 F (36.8 C) 99 F (37.2 C) 98.5 F (36.9 C)   TempSrc: Oral Oral Oral   SpO2:      Weight:      Height:       Physical Examination: General appearance - alert, well appearing, and in no distress Mental status - normal mood, behavior, speech, dress, motor activity, and thought processes Chest - normal respiratory effort, no respiratory distress noted Heart - normal rate and regular rhythm Abdomen - gravid, soft and non-tender Musculoskeletal - no calf tenderness bilaterally Extremities - no edema  Fetal Monitoring:  Baseline: 135 bpm, Variability: moderate, Accelerations: Present, and Decelerations: Absent    Reactive and reassuring for gestational age  Labs:     Latest Ref Rng & Units 04/28/2023    7:28 AM 04/25/2023    6:59 AM 04/16/2023    3:38 PM  CBC  WBC 4.0 - 10.5 K/uL 11.9  12.2  11.2   Hemoglobin 12.0 - 15.0 g/dL 78.4  69.6  29.5   Hematocrit 36.0 - 46.0 % 36.2  34.1  32.6   Platelets 150 - 400 K/uL 170  188  227     Imaging Studies:    No results found.   Medications:  Scheduled  prenatal multivitamin  1 tablet Oral Q1200   sodium chloride  flush  3 mL Intravenous Q12H   I have reviewed the patient's current medications.  ASSESSMENT:  PPROM AMA Bicornuate uterus Breech presentation [redacted] weeks gestation  PLAN: 1) PPROM -Already received latency antibiotics, BMZ 11/10-11/11 and Magnesium -No current evidence of infection or labor -NICU consult has been done on 11/10 -Last growth scan 11/10, continue serial scans q 3-4wks -Continue q72h CBC checks, T&S updates -Consider rescue steroids closer to delivery  2) Fetal well being -Reactive NSTs over the day,, continue twice daily monitoring -Breech presentation, plan for primary cesarean-section- consent in chart  3) Maternal care -Routine antenatal care   Continue inpatient care as outlined above.  Timing of delivery pending maternal/fetal status with goal of delivery at [redacted] weeks gestation.  Patient currently remains stable.   Jaynie Collins, MD, FACOG Obstetrician & Gynecologist, [facility] for [employer], [facility]

## 2023-04-29 LAB — TYPE AND SCREEN
ABO/RH(D): O POS
Antibody Screen: NEGATIVE

## 2023-04-29 NOTE — Progress Notes (Signed)
Patient ID: Cassie Petersen, female   DOB: 1985/11/22, 37 y.o.   MRN: [id number]  FACULTY PRACTICE ANTEPARTUM(COMPREHENSIVE) NOTE  Cassie Petersen is a 37 y.o. [id number] with Estimated Date of Delivery: 07/10/23   By  LMP [redacted]w[redacted]d  who is admitted PPROM  Fetal presentation is breech. Length of Stay:  16  Days  Date of admission:04/13/2023  Subjective: Patient is doing well without complaints overnight.  Patient reports good fetal movement, she denies vaginal bleeding or contractions  Vitals:  Blood pressure 122/68, pulse 97, temperature 98.5 F (36.9 C), temperature source Oral, resp. rate 18, height 5\' 8"  (1.727 m), weight 62.1 kg, last menstrual period 10/03/2022, SpO2 100%. Vitals:   04/27/23 2139 04/28/23 0808 04/28/23 1230 04/28/23 1948  BP: 109/64 (!) 97/56 107/65 122/68  Pulse: 89 96 (!) 101 97  Resp: 20 19 18 18   Temp: 98.5 F (36.9 C)  98.2 F (36.8 C) 98.5 F (36.9 C)  TempSrc: Oral  Oral Oral  SpO2:   100% 100%  Weight:      Height:       Physical Examination: GENERAL: Well-developed, well-nourished female in no acute distress.  LUNGS: Clear to auscultation bilaterally.  HEART: Regular rate and rhythm. ABDOMEN: Soft, nontender, gravid PELVIC: Not indicated EXTREMITIES: No cyanosis, clubbing, or edema, 2+ distal pulses.    Fetal Monitoring:  Baseline: 130 bpm, Variability: moderate, Accelerations: +15x15 present, and Decelerations: Absent    reactive  Labs:  Results for orders placed or performed during the hospital encounter of 04/13/23 (from the past 24 hour(s))  Type and screen [facility]   Collection Time: 04/28/23  7:20 AM  Result Value Ref Range   ABO/RH(D) O POS    Antibody Screen NEG    Sample Expiration      05/01/2023,2359 Performed at [facility], [address], [place], [place] [address]   CBC   Collection Time: 04/28/23  7:28 AM  Result Value Ref Range   WBC 11.9 (H) 4.0 - 10.5 K/uL   RBC 3.95 3.87 - 5.11 MIL/uL    Hemoglobin 12.5 12.0 - 15.0 g/dL   HCT 95.6 21.3 - 08.6 %   MCV 91.6 80.0 - 100.0 fL   MCH 31.6 26.0 - 34.0 pg   MCHC 34.5 30.0 - 36.0 g/dL   RDW 57.8 46.9 - 62.9 %   Platelets 170 150 - 400 K/uL   nRBC 0.0 0.0 - 0.2 %    Imaging Studies:    No results found.   Medications:  Scheduled  prenatal multivitamin  1 tablet Oral Q1200   sodium chloride flush  3 mL Intravenous Q12H   I have reviewed the patient's current medications.  ASSESSMENT: [name] Estimated Date of Delivery: 07/10/23  PPROM AMA Bicornuate uterus Breech presentation   PLAN: - Fetal tracing reassuring for current gestational age - s/p BMZ 11/10-11 and magnesium - S/p NICU consult - Last growth scan 11/10, continue serial scans every 3 to 4 weeks - Status post latency antibiotics - breech presentation and consented for C-section - Continue inpatient management as above.  Timing of delivery pending signs/symptoms of chorioamnionitis with a goal of delivery at 34 weeks.  Cassie Petersen Attending Obstetrician & Gynecologist, [facility] for [employer], [facility]

## 2023-04-30 DIAGNOSIS — Z3A29 29 weeks gestation of pregnancy: Secondary | ICD-10-CM

## 2023-04-30 DIAGNOSIS — O321XX0 Maternal care for breech presentation, not applicable or unspecified: Secondary | ICD-10-CM

## 2023-04-30 DIAGNOSIS — O42113 Preterm premature rupture of membranes, onset of labor more than 24 hours following rupture, third trimester: Secondary | ICD-10-CM

## 2023-04-30 NOTE — Progress Notes (Signed)
Patient ID: Cassie Petersen, female   DOB: 1985/11/17, 37 y.o.   MRN: [id number] FACULTY PRACTICE ANTEPARTUM(COMPREHENSIVE) NOTE  Cassie Petersen is a 37 y.o. [id number] with Estimated Date of Delivery: 07/10/23   By  early ultrasound [redacted]w[redacted]d  who is admitted for PROM on 04/13/23    Fetal presentation is breech. Bicornuate uterus Length of Stay:  17  Days  Date of admission:04/13/2023  Subjective: No complaints Patient reports the fetal movement as active. Patient reports uterine contraction  activity as none. Patient reports  vaginal bleeding as none. Patient describes fluid per vagina as Clear.  Vitals:  Blood pressure 113/61, pulse 85, temperature 97.6 F (36.4 C), temperature source Oral, resp. rate 18, height 5\' 8"  (1.727 m), weight 62.1 kg, last menstrual period 10/03/2022, SpO2 98%. Vitals:   04/29/23 2031 04/30/23 0812 04/30/23 1144 04/30/23 1558  BP: 126/63 103/63 (!) 105/55 113/61  Pulse: 87 92 85 85  Resp: 17 18 18 18   Temp: 98.1 F (36.7 C) (!) 97.5 F (36.4 C) 98.3 F (36.8 C) 97.6 F (36.4 C)  TempSrc: Oral Oral Oral Oral  SpO2:  98% 98% 98%  Weight:      Height:       Physical Examination:  General appearance - alert, well appearing, and in no distress Abdomen - soft, nontender, nondistended, no masses or organomegaly Fundal Height:  size equals dates Pelvic Exam:  examination not indicated Cervical Exam: Not evaluated.  Extremities: extremities normal, atraumatic, no cyanosis or edema with DTRs 2+ bilaterally Membranes:ruptured, clear fluid  Fetal Monitoring:  Baseline: 140 bpm, Variability: Good {> 6 bpm), Accelerations: Reactive, and Decelerations: Absent   reactive  Labs:  No results found for this or any previous visit (from the past 24 hour(s)).  Imaging Studies:    No results found.   Medications:  Scheduled  prenatal multivitamin  1 tablet Oral Q1200   sodium chloride flush  3 mL Intravenous Q12H   I have reviewed the patient's current  medications.  ASSESSMENT: [id number] [redacted]w[redacted]d Estimated Date of Delivery: 07/10/23  P/PROM Breech  Patient Active Problem List   Diagnosis Date Noted   [redacted] weeks gestation of pregnancy 04/27/2023   Preterm premature rupture of membranes (PPROM)  in third trimester, antepartum 04/14/2023   Suprvsn of high risk preg due to social problems, unsp tri 02/23/2023   AMA (advanced maternal age) multigravida 35+ 12/12/2022   Family history of breast cancer 07/29/2022   PAI-1 4G/4G genotype 06/12/2020   Abnormal Pap smear of cervix 12/02/2019   Bicornuate uterus     PLAN: >S/P antibiotics/BMZ/neuroprotection/NICU consult >will need C section due to breech  Cassie Petersen 04/30/2023,4:02 PM

## 2023-05-01 ENCOUNTER — Inpatient Hospital Stay (HOSPITAL_COMMUNITY): Payer: Managed Care, Other (non HMO) | Admitting: Anesthesiology

## 2023-05-01 ENCOUNTER — Encounter (HOSPITAL_COMMUNITY)
Admission: AD | Disposition: A | Discharge status: Home / Self Care | Payer: Self-pay | Source: Home / Self Care | Attending: Obstetrics and Gynecology

## 2023-05-01 ENCOUNTER — Other Ambulatory Visit: Payer: Self-pay

## 2023-05-01 DIAGNOSIS — Z3A30 30 weeks gestation of pregnancy: Secondary | ICD-10-CM

## 2023-05-01 DIAGNOSIS — O42013 Preterm premature rupture of membranes, onset of labor within 24 hours of rupture, third trimester: Secondary | ICD-10-CM

## 2023-05-01 DIAGNOSIS — O321XX0 Maternal care for breech presentation, not applicable or unspecified: Secondary | ICD-10-CM

## 2023-05-01 DIAGNOSIS — O3403 Maternal care for unspecified congenital malformation of uterus, third trimester: Secondary | ICD-10-CM

## 2023-05-01 LAB — CBC
HCT: 35.4 % — ABNORMAL LOW (ref 36.0–46.0)
Hemoglobin: 11.9 g/dL — ABNORMAL LOW (ref 12.0–15.0)
MCH: 30.7 pg (ref 26.0–34.0)
MCHC: 33.6 g/dL (ref 30.0–36.0)
MCV: 91.5 fL (ref 80.0–100.0)
Platelets: 148 10*3/uL — ABNORMAL LOW (ref 150–400)
RBC: 3.87 MIL/uL (ref 3.87–5.11)
RDW: 13.3 % (ref 11.5–15.5)
WBC: 11.1 10*3/uL — ABNORMAL HIGH (ref 4.0–10.5)
nRBC: 0 % (ref 0.0–0.2)

## 2023-05-01 LAB — TYPE AND SCREEN
ABO/RH(D): O POS
Antibody Screen: NEGATIVE

## 2023-05-01 SURGERY — Surgical Case
Anesthesia: Spinal

## 2023-05-01 MED ORDER — HYDROMORPHONE HCL 1 MG/ML IJ SOLN
0.2500 mg | INTRAMUSCULAR | Status: DC | PRN
  Administered 2023-05-01 (×4): 0.5 mg via INTRAVENOUS

## 2023-05-01 MED ORDER — SODIUM CHLORIDE 0.9 % IR SOLN
Status: DC | PRN
  Administered 2023-05-01: 1

## 2023-05-01 MED ORDER — HYDROMORPHONE HCL 1 MG/ML IJ SOLN
INTRAMUSCULAR | Status: AC
  Filled 2023-05-01: qty 0.5

## 2023-05-01 MED ORDER — GABAPENTIN 300 MG PO CAPS
300.0000 mg | ORAL_CAPSULE | Freq: Three times a day (TID) | ORAL | Status: DC
  Administered 2023-05-02 – 2023-05-04 (×7): 300 mg via ORAL
  Filled 2023-05-01 (×7): qty 1

## 2023-05-01 MED ORDER — ONDANSETRON HCL 4 MG/2ML IJ SOLN
INTRAMUSCULAR | Status: AC
  Filled 2023-05-01: qty 2

## 2023-05-01 MED ORDER — COCONUT OIL OIL
1.0000 | TOPICAL_OIL | Status: DC | PRN
  Administered 2023-05-02: 1 via TOPICAL

## 2023-05-01 MED ORDER — KETOROLAC TROMETHAMINE 30 MG/ML IJ SOLN
INTRAMUSCULAR | Status: AC
  Filled 2023-05-01: qty 1

## 2023-05-01 MED ORDER — MIDAZOLAM HCL 2 MG/2ML IJ SOLN
INTRAMUSCULAR | Status: AC
  Filled 2023-05-01: qty 2

## 2023-05-01 MED ORDER — PHENYLEPHRINE HCL-NACL 20-0.9 MG/250ML-% IV SOLN
INTRAVENOUS | Status: DC | PRN
  Administered 2023-05-01: 60 ug/min via INTRAVENOUS

## 2023-05-01 MED ORDER — KETAMINE HCL 10 MG/ML IJ SOLN
INTRAMUSCULAR | Status: DC | PRN
  Administered 2023-05-01: 30 mg via INTRAVENOUS

## 2023-05-01 MED ORDER — LORAZEPAM 2 MG/ML IJ SOLN: 0.5000 mg | Freq: Four times a day (QID) | INTRAMUSCULAR | Status: DC | PRN

## 2023-05-01 MED ORDER — IBUPROFEN 600 MG PO TABS
600.0000 mg | ORAL_TABLET | Freq: Four times a day (QID) | ORAL | Status: DC
  Administered 2023-05-02 – 2023-05-04 (×8): 600 mg via ORAL
  Filled 2023-05-01 (×8): qty 1

## 2023-05-01 MED ORDER — OXYTOCIN-SODIUM CHLORIDE 30-0.9 UT/500ML-% IV SOLN
INTRAVENOUS | Status: AC
Start: 2023-05-01 — End: ?
  Filled 2023-05-01: qty 500

## 2023-05-01 MED ORDER — KETAMINE HCL 50 MG/5ML IJ SOSY
PREFILLED_SYRINGE | INTRAMUSCULAR | Status: AC
  Filled 2023-05-01: qty 5

## 2023-05-01 MED ORDER — NITROGLYCERIN 0.4 MG/SPRAY TL SOLN
Status: DC | PRN
  Administered 2023-05-01: 2 via SUBLINGUAL
  Administered 2023-05-01: 4 via SUBLINGUAL

## 2023-05-01 MED ORDER — KETOROLAC TROMETHAMINE 30 MG/ML IJ SOLN
30.0000 mg | Freq: Once | INTRAMUSCULAR | Status: AC | PRN
  Administered 2023-05-01: 30 mg via INTRAVENOUS

## 2023-05-01 MED ORDER — SOD CITRATE-CITRIC ACID 500-334 MG/5ML PO SOLN
30.0000 mL | Freq: Once | ORAL | Status: AC
  Administered 2023-05-01: 30 mL via ORAL

## 2023-05-01 MED ORDER — PROPOFOL 10 MG/ML IV BOLUS
INTRAVENOUS | Status: DC | PRN
Start: 2023-05-01 — End: 2023-05-01
  Administered 2023-05-01 (×2): 10 mg via INTRAVENOUS

## 2023-05-01 MED ORDER — STERILE WATER FOR IRRIGATION IR SOLN
Status: DC | PRN
  Administered 2023-05-01: 1000 mL

## 2023-05-01 MED ORDER — ONDANSETRON HCL 4 MG/2ML IJ SOLN
4.0000 mg | Freq: Four times a day (QID) | INTRAMUSCULAR | Status: DC | PRN
Start: 2023-05-01 — End: 2023-05-02
  Administered 2023-05-01: 4 mg via INTRAVENOUS

## 2023-05-01 MED ORDER — LACTATED RINGERS IV SOLN: INTRAVENOUS | Status: DC | PRN

## 2023-05-01 MED ORDER — DIPHENHYDRAMINE HCL 50 MG/ML IJ SOLN
INTRAMUSCULAR | Status: DC | PRN
  Administered 2023-05-01: 12.5 mg via INTRAVENOUS

## 2023-05-01 MED ORDER — OXYTOCIN-SODIUM CHLORIDE 30-0.9 UT/500ML-% IV SOLN
INTRAVENOUS | Status: DC | PRN
  Administered 2023-05-01: 300 mL via INTRAVENOUS

## 2023-05-01 MED ORDER — GABAPENTIN 300 MG PO CAPS
ORAL_CAPSULE | ORAL | Status: AC
  Filled 2023-05-01: qty 1

## 2023-05-01 MED ORDER — ACETAMINOPHEN 10 MG/ML IV SOLN
INTRAVENOUS | Status: DC | PRN
  Administered 2023-05-01: 1000 mg via INTRAVENOUS

## 2023-05-01 MED ORDER — CEFAZOLIN SODIUM-DEXTROSE 2-3 GM-%(50ML) IV SOLR
INTRAVENOUS | Status: DC | PRN
  Administered 2023-05-01: 2 g via INTRAVENOUS

## 2023-05-01 MED ORDER — FENTANYL CITRATE (PF) 100 MCG/2ML IJ SOLN
INTRAMUSCULAR | Status: AC
  Filled 2023-05-01: qty 2

## 2023-05-01 MED ORDER — SENNOSIDES-DOCUSATE SODIUM 8.6-50 MG PO TABS
2.0000 | ORAL_TABLET | Freq: Every day | ORAL | Status: DC
  Administered 2023-05-02: 2 via ORAL
  Filled 2023-05-01 (×2): qty 2

## 2023-05-01 MED ORDER — DEXAMETHASONE SODIUM PHOSPHATE 10 MG/ML IJ SOLN: INTRAMUSCULAR | Status: DC | PRN

## 2023-05-01 MED ORDER — FENTANYL CITRATE (PF) 100 MCG/2ML IJ SOLN
INTRAMUSCULAR | Status: DC | PRN
  Administered 2023-05-01: 85 ug via INTRAVENOUS
  Administered 2023-05-01: 100 ug via INTRAVENOUS

## 2023-05-01 MED ORDER — MENTHOL 3 MG MT LOZG: 1.0000 | LOZENGE | OROMUCOSAL | Status: DC | PRN

## 2023-05-01 MED ORDER — ENOXAPARIN SODIUM 40 MG/0.4ML IJ SOSY
40.0000 mg | PREFILLED_SYRINGE | INTRAMUSCULAR | Status: DC
  Filled 2023-05-01: qty 0.4

## 2023-05-01 MED ORDER — LACTATED RINGERS IV BOLUS
1000.0000 mL | Freq: Once | INTRAVENOUS | Status: AC
  Administered 2023-05-01: 1000 mL via INTRAVENOUS

## 2023-05-01 MED ORDER — DIPHENHYDRAMINE HCL 25 MG PO CAPS: 25.0000 mg | ORAL_CAPSULE | Freq: Four times a day (QID) | ORAL | Status: DC | PRN

## 2023-05-01 MED ORDER — ZOLPIDEM TARTRATE 5 MG PO TABS: 5.0000 mg | ORAL_TABLET | Freq: Every evening | ORAL | Status: DC | PRN

## 2023-05-01 MED ORDER — NITROGLYCERIN 0.4 MG/SPRAY TL SOLN
Status: AC
  Filled 2023-05-01: qty 4.9

## 2023-05-01 MED ORDER — MORPHINE SULFATE (PF) 0.5 MG/ML IJ SOLN
INTRAMUSCULAR | Status: AC
  Filled 2023-05-01: qty 10

## 2023-05-01 MED ORDER — DEXAMETHASONE SODIUM PHOSPHATE 10 MG/ML IJ SOLN
INTRAMUSCULAR | Status: AC
  Filled 2023-05-01: qty 1

## 2023-05-01 MED ORDER — KETOROLAC TROMETHAMINE 30 MG/ML IJ SOLN
30.0000 mg | Freq: Four times a day (QID) | INTRAMUSCULAR | Status: AC
  Administered 2023-05-02: 30 mg via INTRAVENOUS
  Filled 2023-05-01: qty 1

## 2023-05-01 MED ORDER — SODIUM CHLORIDE 0.9 % IV SOLN
INTRAVENOUS | Status: DC | PRN
  Administered 2023-05-01: 500 mg via INTRAVENOUS

## 2023-05-01 MED ORDER — FENTANYL CITRATE (PF) 100 MCG/2ML IJ SOLN
INTRAMUSCULAR | Status: DC | PRN
  Administered 2023-05-01: 15 ug via INTRATHECAL

## 2023-05-01 MED ORDER — WITCH HAZEL-GLYCERIN EX PADS: 1.0000 | MEDICATED_PAD | CUTANEOUS | Status: DC | PRN

## 2023-05-01 MED ORDER — PHENYLEPHRINE 80 MCG/ML (10ML) SYRINGE FOR IV PUSH (FOR BLOOD PRESSURE SUPPORT)
PREFILLED_SYRINGE | INTRAVENOUS | Status: DC | PRN
  Administered 2023-05-01 (×3): 160 ug via INTRAVENOUS

## 2023-05-01 MED ORDER — DEXAMETHASONE SODIUM PHOSPHATE 10 MG/ML IJ SOLN
INTRAMUSCULAR | Status: DC | PRN
  Administered 2023-05-01: 10 mg via INTRAVENOUS

## 2023-05-01 MED ORDER — BUPIVACAINE IN DEXTROSE 0.75-8.25 % IT SOLN
INTRATHECAL | Status: DC | PRN
  Administered 2023-05-01: 1.6 mL via INTRATHECAL

## 2023-05-01 MED ORDER — FENTANYL CITRATE (PF) 100 MCG/2ML IJ SOLN: 25.0000 ug | INTRAMUSCULAR | Status: DC | PRN

## 2023-05-01 MED ORDER — PHENYLEPHRINE 80 MCG/ML (10ML) SYRINGE FOR IV PUSH (FOR BLOOD PRESSURE SUPPORT)
PREFILLED_SYRINGE | INTRAVENOUS | Status: AC
  Filled 2023-05-01: qty 10

## 2023-05-01 MED ORDER — DEXMEDETOMIDINE HCL IN NACL 80 MCG/20ML IV SOLN
INTRAVENOUS | Status: DC | PRN
  Administered 2023-05-01: 4 ug via INTRAVENOUS
  Administered 2023-05-01 (×2): 8 ug via INTRAVENOUS

## 2023-05-01 MED ORDER — HYDROMORPHONE 1 MG/ML IV SOLN: INTRAVENOUS | Status: DC

## 2023-05-01 MED ORDER — PHENYLEPHRINE HCL-NACL 20-0.9 MG/250ML-% IV SOLN
INTRAVENOUS | Status: AC
  Filled 2023-05-01: qty 250

## 2023-05-01 MED ORDER — DIBUCAINE (PERIANAL) 1 % EX OINT: 1.0000 | TOPICAL_OINTMENT | CUTANEOUS | Status: DC | PRN

## 2023-05-01 MED ORDER — GABAPENTIN 300 MG PO CAPS
300.0000 mg | ORAL_CAPSULE | Freq: Once | ORAL | Status: AC
  Administered 2023-05-01: 300 mg via ORAL

## 2023-05-01 MED ORDER — DEXMEDETOMIDINE HCL IN NACL 80 MCG/20ML IV SOLN
INTRAVENOUS | Status: AC
  Filled 2023-05-01: qty 20

## 2023-05-01 MED ORDER — PROPOFOL 10 MG/ML IV BOLUS
INTRAVENOUS | Status: AC
Start: 2023-05-01 — End: ?
  Filled 2023-05-01: qty 20

## 2023-05-01 MED ORDER — MORPHINE SULFATE (PF) 0.5 MG/ML IJ SOLN
INTRAMUSCULAR | Status: DC | PRN
  Administered 2023-05-01: 150 ug via INTRATHECAL

## 2023-05-01 MED ORDER — PRENATAL MULTIVITAMIN CH
1.0000 | ORAL_TABLET | Freq: Every day | ORAL | Status: DC
  Filled 2023-05-01 (×2): qty 1

## 2023-05-01 MED ORDER — MIDAZOLAM HCL 2 MG/2ML IJ SOLN
INTRAMUSCULAR | Status: DC | PRN
  Administered 2023-05-01 (×2): 2 mg via INTRAVENOUS

## 2023-05-01 MED ORDER — SODIUM CHLORIDE 0.9% FLUSH: 9.0000 mL | INTRAVENOUS | Status: DC | PRN

## 2023-05-01 MED ORDER — METHYLERGONOVINE MALEATE 0.2 MG PO TABS
0.2000 mg | ORAL_TABLET | ORAL | Status: DC | PRN
Start: 2023-05-01 — End: 2023-05-04

## 2023-05-01 MED ORDER — LORAZEPAM 2 MG/ML IJ SOLN
0.5000 mg | Freq: Once | INTRAMUSCULAR | Status: DC | PRN
  Filled 2023-05-01: qty 0.25

## 2023-05-01 MED ORDER — OXYCODONE HCL 5 MG PO TABS: 5.0000 mg | ORAL_TABLET | Freq: Once | ORAL | Status: DC | PRN

## 2023-05-01 MED ORDER — SIMETHICONE 80 MG PO CHEW
80.0000 mg | CHEWABLE_TABLET | Freq: Three times a day (TID) | ORAL | Status: DC
  Administered 2023-05-02 – 2023-05-04 (×7): 80 mg via ORAL
  Filled 2023-05-01 (×7): qty 1

## 2023-05-01 MED ORDER — DIPHENHYDRAMINE HCL 50 MG/ML IJ SOLN
INTRAMUSCULAR | Status: AC
Start: 2023-05-01 — End: ?
  Filled 2023-05-01: qty 1

## 2023-05-01 MED ORDER — METHYLERGONOVINE MALEATE 0.2 MG/ML IJ SOLN: 0.2000 mg | INTRAMUSCULAR | Status: DC | PRN

## 2023-05-01 MED ORDER — PROPOFOL 10 MG/ML IV BOLUS
INTRAVENOUS | Status: AC
  Filled 2023-05-01: qty 20

## 2023-05-01 MED ORDER — SIMETHICONE 80 MG PO CHEW
80.0000 mg | CHEWABLE_TABLET | ORAL | Status: DC | PRN
  Administered 2023-05-03: 80 mg via ORAL
  Filled 2023-05-01: qty 1

## 2023-05-01 MED ORDER — OXYCODONE HCL 5 MG/5ML PO SOLN: 5.0000 mg | Freq: Once | ORAL | Status: DC | PRN

## 2023-05-01 MED ORDER — ONDANSETRON HCL 4 MG/2ML IJ SOLN
INTRAMUSCULAR | Status: DC | PRN
  Administered 2023-05-01: 4 mg via INTRAVENOUS

## 2023-05-01 MED ORDER — OXYTOCIN-SODIUM CHLORIDE 30-0.9 UT/500ML-% IV SOLN
INTRAVENOUS | Status: AC
  Filled 2023-05-01: qty 500

## 2023-05-01 MED ORDER — TRANEXAMIC ACID-NACL 1000-0.7 MG/100ML-% IV SOLN
INTRAVENOUS | Status: DC | PRN
  Administered 2023-05-01: 1000 mg via INTRAVENOUS

## 2023-05-01 MED ORDER — NALOXONE HCL 0.4 MG/ML IJ SOLN: 0.4000 mg | INTRAMUSCULAR | Status: DC | PRN

## 2023-05-01 MED ORDER — SOD CITRATE-CITRIC ACID 500-334 MG/5ML PO SOLN
ORAL | Status: AC
  Filled 2023-05-01: qty 30

## 2023-05-01 MED ORDER — OXYTOCIN-SODIUM CHLORIDE 30-0.9 UT/500ML-% IV SOLN
2.5000 [IU]/h | INTRAVENOUS | Status: AC
Start: 2023-05-01 — End: 2023-05-02

## 2023-05-01 SURGICAL SUPPLY — 33 items
BENZOIN TINCTURE PRP APPL 2/3 (GAUZE/BANDAGES/DRESSINGS) IMPLANT
CHLORAPREP W/TINT 26 (MISCELLANEOUS) ×2 IMPLANT
CLAMP UMBILICAL CORD (MISCELLANEOUS) ×1 IMPLANT
CLOTH BEACON ORANGE TIMEOUT ST (SAFETY) ×1 IMPLANT
DERMABOND ADVANCED .7 DNX12 (GAUZE/BANDAGES/DRESSINGS) ×2 IMPLANT
DRSG OPSITE POSTOP 4X10 (GAUZE/BANDAGES/DRESSINGS) ×1 IMPLANT
ELECT REM PT RETURN 9FT ADLT (ELECTROSURGICAL) ×1 IMPLANT
ELECTRODE REM PT RTRN 9FT ADLT (ELECTROSURGICAL) ×1 IMPLANT
EXTRACTOR VACUUM BELL STYLE (SUCTIONS) IMPLANT
GLOVE BIOGEL PI IND STRL 7.0 (GLOVE) ×1 IMPLANT
GLOVE BIOGEL PI IND STRL 8 (GLOVE) ×1 IMPLANT
GLOVE ECLIPSE 8.0 STRL XLNG CF (GLOVE) ×1 IMPLANT
GOWN STRL REUS W/TWL LRG LVL3 (GOWN DISPOSABLE) ×2 IMPLANT
KIT ABG SYR 3ML LUER SLIP (SYRINGE) ×1 IMPLANT
NDL HYPO 18GX1.5 BLUNT FILL (NEEDLE) ×1 IMPLANT
NDL HYPO 25X5/8 SAFETYGLIDE (NEEDLE) ×1 IMPLANT
NEEDLE HYPO 18GX1.5 BLUNT FILL (NEEDLE) ×1 IMPLANT
NEEDLE HYPO 22GX1.5 SAFETY (NEEDLE) ×1 IMPLANT
NEEDLE HYPO 25X5/8 SAFETYGLIDE (NEEDLE) ×1 IMPLANT
NS IRRIG 1000ML POUR BTL (IV SOLUTION) ×1 IMPLANT
PACK C SECTION WH (CUSTOM PROCEDURE TRAY) ×1 IMPLANT
PAD OB MATERNITY 4.3X12.25 (PERSONAL CARE ITEMS) ×1 IMPLANT
RTRCTR C-SECT PINK 25CM LRG (MISCELLANEOUS) IMPLANT
STRIP CLOSURE SKIN 1/2X4 (GAUZE/BANDAGES/DRESSINGS) IMPLANT
SUT CHROMIC 0 CT 1 (SUTURE) ×1 IMPLANT
SUT MNCRL 0 VIOLET CTX 36 (SUTURE) ×2 IMPLANT
SUT PLAIN 2 0 XLH (SUTURE) IMPLANT
SUT PLAIN ABS 2-0 CT1 27XMFL (SUTURE) IMPLANT
SUT VIC AB 0 CTX36XBRD ANBCTRL (SUTURE) ×1 IMPLANT
SUT VIC AB 4-0 KS 27 (SUTURE) IMPLANT
TOWEL OR 17X24 6PK STRL BLUE (TOWEL DISPOSABLE) ×1 IMPLANT
TRAY FOLEY W/BAG SLVR 14FR LF (SET/KITS/TRAYS/PACK) IMPLANT
WATER STERILE IRR 1000ML POUR (IV SOLUTION) ×1 IMPLANT

## 2023-05-01 NOTE — Progress Notes (Signed)
Pt declines PCA at this time.

## 2023-05-01 NOTE — Anesthesia Preprocedure Evaluation (Signed)
Anesthesia Evaluation  Patient identified by MRN, date of birth, ID band Patient awake    Reviewed: Allergy & Precautions, NPO status , Patient's Chart, lab work & pertinent test results  History of Anesthesia Complications (+) PONV and history of anesthetic complications (reports previous epidurals have not worked)  Airway Mallampati: I  TM Distance: >3 FB Neck ROM: Full    Dental   Pulmonary neg pulmonary ROS   Pulmonary exam normal breath sounds clear to auscultation       Cardiovascular negative cardio ROS  Rhythm:Regular Rate:Normal     Neuro/Psych negative neurological ROS     GI/Hepatic negative GI ROS, Neg liver ROS,,,  Endo/Other  negative endocrine ROS    Renal/GU negative Renal ROS     Musculoskeletal   Abdominal   Peds  Hematology negative hematology ROS (+) Lab Results      Component                Value               Date                      WBC                      11.1 (H)            05/01/2023                HGB                      11.9 (L)            05/01/2023                HCT                      35.4 (L)            05/01/2023                MCV                      91.5                05/01/2023                PLT                      148 (L)             05/01/2023              Anesthesia Other Findings   Reproductive/Obstetrics (+) Pregnancy Bicornuate uterus                              Anesthesia Physical Anesthesia Plan  ASA: 2  Anesthesia Plan: Spinal   Post-op Pain Management:    Induction:   PONV Risk Score and Plan: 3 and Ondansetron and Dexamethasone  Airway Management Planned: Natural Airway  Additional Equipment:   Intra-op Plan:   Post-operative Plan:   Informed Consent: I have reviewed the patients History and Physical, chart, labs and discussed the procedure including the risks, benefits and alternatives for the proposed  anesthesia with the patient or authorized representative who has indicated his/her understanding and acceptance.       Plan Discussed with:  Anesthesiologist and CRNA  Anesthesia Plan Comments: (I have discussed risks of neuraxial anesthesia including but not limited to infection, bleeding, nerve injury, back pain, headache, seizures, and failure of block. Patient denies bleeding disorders and is not currently anticoagulated. Labs have been reviewed. Risks and benefits discussed. All patient's questions answered.  )         Anesthesia Quick Evaluation

## 2023-05-01 NOTE — Anesthesia Procedure Notes (Signed)
Spinal  Patient location during procedure: OR Start time: 05/01/2023 6:39 PM End time: 05/01/2023 6:41 PM Reason for block: surgical anesthesia Staffing Performed: anesthesiologist  Anesthesiologist: Linton Rump, MD Performed by: Linton Rump, MD Authorized by: Linton Rump, MD   Preanesthetic Checklist Completed: patient identified, IV checked, site marked, risks and benefits discussed, surgical consent, monitors and equipment checked, pre-op evaluation and timeout performed Spinal Block Patient position: sitting Prep: DuraPrep Patient monitoring: blood pressure and continuous pulse ox Approach: midline Location: L3-4 Injection technique: single-shot Needle Needle type: Pencan  Needle gauge: 24 G Needle length: 9 cm Assessment Sensory level: T4 Additional Notes Risks and benefits of neuraxial anesthesia including, but not limited to, infection, bleeding, local anesthetic toxicity, headache, hypotension, back pain, block failure, etc. were discussed with the patient. The patient expressed understanding and consented to the procedure. I confirmed that the patient has no bleeding disorders and is not taking blood thinners. I confirmed the patient's last platelet count with the nurse. Monitors were applied. A time-out was performed immediately prior to the procedure. Sterile technique was used throughout the whole procedure.   1 attempt(s)

## 2023-05-01 NOTE — Progress Notes (Signed)
Pt declined fundal rubs at this time due to pain 10/10. Rn continues to medicate per orders. Patient educated about increased risk of bleeding. RN able to visual assess peripads - lochia on pads appears WNL.

## 2023-05-01 NOTE — Op Note (Signed)
Preoperative diagnosis:  1.  Intrauterine pregnancy at [redacted]w[redacted]d  weeks gestation                                         2.  P/PROM x 18 days                                         3.  Breech presentation                                         4.  Bicornuate uterus                                         5.  Repetitive variable declarations, progressively deepening                                         6.  Suspected labor, confirmed on  exam in OR   Postoperative diagnosis:  Same as above plus entrapped head in the left horn of the bicornuate uterus  Procedure:  Primary cesarean section, vertical uterine incision, converted to classical  Surgeon:  [name] MD  Assistant:    Anesthesia: Spinal  Findings:    Earlier in the afternoon approximately 2 hours prior to decision for surgery, the fetus was noted to be having variable decelerations on the external fetal monitor.  Throughout this time the decelerations were fairly regular in timing 5 minutes or so and my suspicion the patient was having uterine activity.  However the patient denied contractions and none were picked up and the external fetal monitor.  She is quite thin and her uterus is always very easily palpable and we really could not discern by palpation if she was having significant uterine activity or not. The decelerations eventually began to deepen.  There was always good return to baseline in good fetal heart rate variability so there was no evidence of decompensation metabolically.  When I went down to see the patient just prior to the decision for surgery, she was complaining of the need to avoid.  I had instructed the nurses to perform a bladder scan which showed 100 cc in the bladder.  I then suspected fetal pressure as being the source of her pain although she still did not complain of uterine activity.  I performed a bedside ultrasound which showed a small amount of urine in her bladder but with fetal parts below the  bladder specifically the breech.  She was in so much discomfort from this need to avoid pressure she could not of tolerated a digital vaginal exam.  It really did not matter from my decision standpoint.  The ultrasound did not show cord in the lower segment or involved with the breech.  But because of the repetitive decelerations, and she had premature preterm rupture of membranes now for 18 days I recommended proceeding with an urgent cesarean section.  The patient and her husband agreed with this plan  Specific times are as follows: 1605  notified of external fetal monitoring showing some variable decelerations, they were mild with good return 1630 I came down to talk with the patient and her husband and discuss the needle fetal heart rate pattern that we would continue to monitor 1747 I was at bedside to do an ultrasound and to talk with the patient and her husband once again regarding the progression of the fetal heart rate variable decelerations and my decision to proceed with a cesarean section, which identified as urgent 1836 patient was in the operating room area 1904 delivery    Intraoperatively, after the spinal had been placed, I performed a digital vaginal exam which revealed no cervix in the breech at +2 station. Since it was a bicornuate uterus, breech with the fetal vertex in the left uterine horn and essentially no fluid around the baby for 18 days I decided preoperatively to perform a vertical uterine incision.  I was able to deliver the breech and body easily through this generous vertical incision which included the myometrium.  However the fetal vertex was essentially entrapped in the uterine horn on the left and since there has been no fluid around the baby in this form for now 18 days, the head was entrapped.  I did not try to force delivery of the head.  I extended the end vertical incision all the way to the top of the uterus.  I was still unable to deliver the head atraumatically.   I asked anesthesia to administer nitroglycerin and after 60 to 90 seconds the left uterine horn relaxed and I was able to deliver the fetal vertex. Arterial cord gas was collected and revealed a pH of 7.23 and a pCO2 of 65.  I would estimate there was 3 minutes of time passage between delivery of the body and the head.  The neonatology team was present for delivery and performed neonatal resuscitation including intubation.  Once the baby was intubated he responded well to oxygenation and ventilation.    Over a vertical incision was delivered a viable female with Apgars of 0/3/5 and weighing 1820 grams.  Uterus of course was bicornuate.  Tubes and ovaries were normal. I reviewed the case in detail including sharing of this photo with the patient and her husband and explained she cannot have a vaginal delivery in the future and would require delivery at 36 weeks.  They understand and are unlikely to have a future pregnancy.     Description of operation:  Patient was taken to the operating room and placed in the sitting position where she underwent a spinal anesthetic. S he was then placed in the supine position with tilt to the left side.  As her level was coming up I preformed the digital vaginal exam revealing the breech to be a +2 station, no cervix was appreciated When adequate anesthetic level was obtained she was prepped and draped in usual sterile fashion and a Foley catheter was placed.  A Pfannenstiel skin incision was made and carried down sharply to the rectus fascia which was scored in the midline extended laterally.  The fascia was taken off the muscles both superiorly and without difficulty.  The muscles were divided.   The peritoneal cavity was entered.   Alexis retractor and Bladder blade was placed, no bladder flap was created.   A vertical hysterotomy incision was made involving the lower segment and into the myometrium. As described above I was able to deliver the breech without  difficulty through this incision all the  way to the level of the scapulas. Because there was no amniotic fluid around the baby, the right arm was hyperextended and not really a nuchal alarm but up around the head I had to take great care to deliver the right arm I carefully. The left arm essentially came out easily with delivery of the body. At this point I recognized immediately that the left uterine horn essentially had "shrink wrapped" around the head I immediately extended the vertical incision from about halfway up the uterus all the way to the top of the uterus. You can sort of see where that transition took place on the photo above Even after this when I put my hand behind the fetal vertex the left uterine horn had no ill and so I asked for uterine relaxation by nitroglycerin 2 sprays of nitroglycerin were given immediately If this had not worked I was going to essentially remove the left upper quadrant of the anterior uterine wall in order to deliver the baby At no time did I placed excessive traction on the head to prevent any trauma I am estimating it took 60 to 90 seconds before the left uterine horn relaxed to the point I could deliver the head I immediately performed a cord clamp and handed the baby off to the NICU team who were in attendance Cord gas was collected and pH was 7.23 with a pCO2 of 65 confirming good metabolic status at the time of delivery  Apgars were 0/3/5 and the baby weighed 1820 g  The uterus was closed in 4 layers and the upper part of the uterus 2 layers in the lower segment and in that transition area required 3 layers The photo above reveals the uterus after 1 layer of closure I closed the significant vertical portion separately from the lower segment and initial vertical myometrial portion as the thickness was much different When the 2 thicknesses were equilibrated I ran a stitch and imbricating stitch from top to bottom There was good hemostasis I could  appreciate the bicornuate nature of the uterus but honestly after having done all the incision that I did up the uterus for the most part at least in this state it no longer felt bicornuate, more heart-shaped  I did  observe the uterus for some time to ensure hemostasis  The rectus muscles and peritoneum were closed loosely in an interrupted fashion The fascia was closed using 0 Vicryl running Subcutaneous tissue was made hemostatic The skin was closed using 4-0 Vicryl on a Keith needle in a subcuticular fashion Dermabond was placed Honeycomb dressing was placed Pressure dressing was placed  EBL 642 cc  [name] 05/01/2023 8:37 PM

## 2023-05-01 NOTE — Transfer of Care (Signed)
Immediate Anesthesia Transfer of Care Note  Patient: Cassie Petersen  Procedure(s) Performed: CESAREAN SECTION  Patient Location: PACU  Anesthesia Type:Spinal  Level of Consciousness: awake  Airway & Oxygen Therapy: Patient Spontanous Breathing  Post-op Assessment: Report given to RN and Post -op Vital signs reviewed and stable  Post vital signs: Reviewed and stable  Last Vitals:  Vitals Value Taken Time  BP 110/93 05/01/23 2030  Temp    Pulse 96 05/01/23 2034  Resp 22 05/01/23 2034  SpO2 93 % 05/01/23 2034  Vitals shown include unfiled device data.  Last Pain:  Vitals:   05/01/23 1532  TempSrc: Oral  PainSc:       Patients Stated Pain Goal: 3 (04/28/23 0805)  Complications: No notable events documented.

## 2023-05-02 ENCOUNTER — Encounter (HOSPITAL_COMMUNITY): Payer: Self-pay

## 2023-05-02 LAB — COMPREHENSIVE METABOLIC PANEL
ALT: 8 U/L (ref 0–44)
AST: 18 U/L (ref 15–41)
Albumin: 1.9 g/dL — ABNORMAL LOW (ref 3.5–5.0)
Alkaline Phosphatase: 81 U/L (ref 38–126)
Anion gap: 6 (ref 5–15)
BUN: 7 mg/dL (ref 6–20)
CO2: 21 mmol/L — ABNORMAL LOW (ref 22–32)
Calcium: 8.2 mg/dL — ABNORMAL LOW (ref 8.9–10.3)
Chloride: 106 mmol/L (ref 98–111)
Creatinine, Ser: 0.33 mg/dL — ABNORMAL LOW (ref 0.44–1.00)
GFR, Estimated: >60 mL/min (ref >60)
Glucose, Bld: 103 mg/dL — ABNORMAL HIGH (ref 70–99)
Potassium: 3.9 mmol/L (ref 3.5–5.1)
Sodium: 133 mmol/L — ABNORMAL LOW (ref 135–145)
Total Bilirubin: 0.6 mg/dL (ref <1.2)
Total Protein: 4.2 g/dL — ABNORMAL LOW (ref 6.5–8.1)

## 2023-05-02 LAB — CBC
HCT: 26.3 % — ABNORMAL LOW (ref 36.0–46.0)
Hemoglobin: 9.2 g/dL — ABNORMAL LOW (ref 12.0–15.0)
MCH: 31.8 pg (ref 26.0–34.0)
MCHC: 35 g/dL (ref 30.0–36.0)
MCV: 91 fL (ref 80.0–100.0)
Platelets: 149 10*3/uL — ABNORMAL LOW (ref 150–400)
RBC: 2.89 MIL/uL — ABNORMAL LOW (ref 3.87–5.11)
RDW: 12.9 % (ref 11.5–15.5)
WBC: 21.8 10*3/uL — ABNORMAL HIGH (ref 4.0–10.5)
nRBC: 0 % (ref 0.0–0.2)

## 2023-05-02 MED ORDER — OXYCODONE HCL 5 MG PO TABS
5.0000 mg | ORAL_TABLET | Freq: Four times a day (QID) | ORAL | Status: DC | PRN
  Administered 2023-05-02: 5 mg via ORAL
  Filled 2023-05-02: qty 1

## 2023-05-02 MED ORDER — FUROSEMIDE 40 MG PO TABS
40.0000 mg | ORAL_TABLET | Freq: Once | ORAL | Status: AC
  Administered 2023-05-02: 40 mg via ORAL
  Filled 2023-05-02: qty 1

## 2023-05-02 MED ORDER — POLYETHYLENE GLYCOL 3350 17 G PO PACK
17.0000 g | PACK | Freq: Every day | ORAL | Status: DC
  Filled 2023-05-02: qty 1

## 2023-05-02 MED ORDER — ACETAMINOPHEN 500 MG PO TABS: 1000.0000 mg | ORAL_TABLET | Freq: Three times a day (TID) | ORAL | Status: DC

## 2023-05-02 MED ORDER — OXYCODONE HCL 5 MG PO TABS
5.0000 mg | ORAL_TABLET | ORAL | Status: DC | PRN
  Administered 2023-05-02 – 2023-05-03 (×3): 5 mg via ORAL
  Filled 2023-05-02 (×3): qty 1

## 2023-05-02 MED ORDER — LACTATED RINGERS IV BOLUS
1000.0000 mL | Freq: Once | INTRAVENOUS | Status: AC
  Administered 2023-05-02: 1000 mL via INTRAVENOUS

## 2023-05-02 MED ORDER — ACETAMINOPHEN 500 MG PO TABS
1000.0000 mg | ORAL_TABLET | Freq: Three times a day (TID) | ORAL | Status: AC
  Administered 2023-05-02 – 2023-05-03 (×5): 1000 mg via ORAL
  Filled 2023-05-02 (×5): qty 2

## 2023-05-02 MED ORDER — FUROSEMIDE 10 MG/ML IJ SOLN: 40.0000 mg | Freq: Once | INTRAMUSCULAR | Status: DC

## 2023-05-02 MED ORDER — HEPARIN SODIUM (PORCINE) 5000 UNIT/ML IJ SOLN
5000.0000 [IU] | Freq: Three times a day (TID) | INTRAMUSCULAR | Status: DC
  Administered 2023-05-02 – 2023-05-03 (×4): 5000 [IU] via SUBCUTANEOUS
  Filled 2023-05-02 (×5): qty 1

## 2023-05-02 NOTE — Progress Notes (Signed)
Subjective: Postpartum Day 1: Cesarean Delivery for repetitive variables Patient reports incisional pain and tolerating PO.    Objective: Vital signs in last 24 hours: Temp:  [96.9 F (36.1 C)-98.6 F (37 C)] 98.6 F (37 C) (11/29 0807) Pulse Rate:  [77-121] 81 (11/29 0807) Resp:  [12-22] 18 (11/29 0807) BP: (82-114)/(45-93) 109/65 (11/29 0807) SpO2:  [89 %-97 %] 97 % (11/29 0807)  Physical Exam:  General: alert, cooperative, and no distress Lochia: appropriate Uterine Fundus: firm Incision: healing well, no significant drainage, no dehiscence, no significant erythema DVT Evaluation: No evidence of DVT seen on physical exam.  Recent Labs    05/01/23 0735 05/02/23 0702  HGB 11.9* 9.2*  HCT 35.4* 26.3*     Assessment/Plan: Status post Cesarean section. Doing well postoperatively.  Continue current care.  [name], MD 05/02/2023, 8:30 AM

## 2023-05-02 NOTE — Anesthesia Postprocedure Evaluation (Signed)
Anesthesia Post Note  Patient: Cassie Petersen  Procedure(s) Performed: CESAREAN SECTION     Patient location during evaluation: PACU Anesthesia Type: Spinal Level of consciousness: awake Pain control: Patient reports 10/10 pain but appears comfortable and is sleepy. Has PCA but doesn't want to use it currently. Declined TAP block. Vital Signs Assessment: post-procedure vital signs reviewed and stable Respiratory status: spontaneous breathing, respiratory function stable and nonlabored ventilation Cardiovascular status: blood pressure returned to baseline and stable Postop Assessment: no headache, no backache and no apparent nausea or vomiting Anesthetic complications: no   No notable events documented.  Last Vitals:  Vitals:   05/01/23 2245 05/01/23 2300  BP: (!) 110/59 109/62  Pulse: 94 91  Resp: 19 16  Temp: (!) 36.4 C (!) 36.1 C  SpO2: 93% 94%    Last Pain:  Vitals:   05/01/23 2300  TempSrc: Axillary  PainSc:                  Linton Rump

## 2023-05-02 NOTE — Progress Notes (Signed)
Ortho VS done with patient, patient denies any dizziness or lightheadedness. Patient intentionally swaying during her VS to "soothe her pain". Patient insists on going to NICU to visit her baby and take colostrum, RN agrees to NT taking her in the [facility].

## 2023-05-02 NOTE — Lactation Note (Addendum)
This note was copied from a baby's chart.  NICU Lactation Consultation Note  Patient Name: Cassie Petersen [id number] Date: 05/02/2023 Age:37 hours  Reason for consult: Initial assessment; NICU baby; Preterm <34wks; Breast augmentation; Other (Comment); Exclusive pumping and bottle feeding (AMA)  SUBJECTIVE Visited with family of 35 hours old pre-term NICU female; baby "Cassie Petersen" got admitted to the NICU due to prematurity and respiratory distress. Cassie Petersen is a P3 and reported that she's very experienced with pumping and prefers to use her home pump Vs the hospital grade one. She'll start pumping as soon as her husband comes to the hospital with her pump pieces; she voiced she had Hx of good supply with her other babies and that her implants were just cosmetic, no Hx of IGT. Reviewed pumping schedule, pumping log, lactogenesis II/III, benefits of breastmilk for NICU babies and anticipatory guidelines.   OBJECTIVE Infant data: O2 Device: Jet Vent FiO2 (%): 28 %  Maternal data: [id number] C-Section, High Vertical Has patient been taught Hand Expression?: Yes Hand Expression Comments: hasn't tried it yet Significant Breast History:: subpectoral silicone implants (bilateral) in Augmentation 2010 Current breast feeding challenges:: NICU admission Previous breastfeeding challenges?: Other (Comment) (D-MER with baby # 1) Does the patient have breastfeeding experience prior to this delivery?: Yes How long did the patient breastfeed?: 1st one breastfeed for 9 months and continued pumping after a year, the 2nd one exclusive pump and bottle feed for > 1 year Pumping frequency: Plans to initiate pumping at 19 hours post-partum, her husband is on the way bringing her Spectra S1 pump from home Flange Size: 24 Risk factor for low/delayed milk supply:: prematurity, infant separation  Pump: Personal (Spectra S1)  ASSESSMENT Infant: Feeding Status: NPO  Maternal: Deferred assessment at this time as  patient denies any issues at this time when offered to assist with hand expression  INTERVENTIONS/PLAN Interventions: Interventions: Breast feeding basics reviewed; Coconut oil; DEBP; Education; [employer] brochure; CDC Guidelines for Breast Pump Cleaning; NICU Pumping Log Tools: Pump; Flanges; Coconut oil Pump Education: Setup, frequency, and cleaning; Milk Storage  Plan: Encouraged pumping every 3 hours, ideally 8 pumping sessions/24 hours Breast massage, hand expression and coconut oil were also encouraged prior pumping She'll let NICU LC team know if she changes her mind and decides taking Cassie Petersen to breast when ready to PO; she's planning on exclusively pumping and bottle feeding at the moment  No other support person at this time. All questions and concerns answered, family to contact [facility] services PRN.  Consult Status: NICU follow-up NICU Follow-up type: New admission follow up   Cassie Petersen S Cassie Petersen 05/02/2023, 1:29 PM

## 2023-05-02 NOTE — Progress Notes (Signed)
Discussed with patient the changes in the orders, Lovenox d/c'd and Heparin ordered every 8 hours. Patient stating that she will likely not take the injection because she has not had any issues. RN encouraged her to take the meds, risks and benefits discussed. Patient agrees to take the meds but insists that her husband give her the injection. He has given her the injections during her pregnancy and she prefers him to give it to her now. She will call when he arrives to the hospital.

## 2023-05-02 NOTE — Progress Notes (Signed)
PHARMACY - ANTICOAGULATION CONSULT NOTE  Pharmacy Consult for heparin Indication: VTE prophylaxis  No Known Allergies  Patient Measurements: Height: 5\' 8"  (172.7 cm) Weight: 62.1 kg (137 lb) IBW/kg (Calculated) : 63.9 Heparin Dosing Weight: n/a  Vital Signs: Temp: 97.8 F (36.6 C) (11/29 1135) Temp Source: Oral (11/29 1135) BP: 112/59 (11/29 1135) Pulse Rate: 91 (11/29 1135)  Labs: Recent Labs    05/01/23 0735 05/02/23 0702  HGB 11.9* 9.2*  HCT 35.4* 26.3*  PLT 148* 149*  CREATININE  --  0.33*    Estimated Creatinine Clearance: 94.4 mL/min (A) (by C-G formula based on SCr of 0.33 mg/dL (L)).   Medical History: Past Medical History:  Diagnosis Date   Bicornuate uterus    HPV in female    PONV (postoperative nausea and vomiting)    Supervision of other normal pregnancy, antepartum 12/12/2022              NURSING     PROVIDER      Office Location    [place]    Dating by    U/S at 10 wks      [facility] Model    Traditional    Anatomy U/S           Initiated care at     [employer]     English                     LAB RESULTS       Support Person    Casimiro Needle    Genetics    NIPS:  LR  AFP:                 NT/IT (FT only)                     Carrier Screen    Horizo   Vaginal Pap smear, abnormal    ASCUS    Medications:  Scheduled:   gabapentin  300 mg Oral TID   heparin injection (subcutaneous)  5,000 Units Subcutaneous Q8H   ketorolac  30 mg Intravenous Q6H   Followed by   Melene Muller ON 05/03/2023] ibuprofen  600 mg Oral Q6H   polyethylene glycol  17 g Oral Q supper   prenatal multivitamin  1 tablet Oral Q1200   senna-docusate  2 tablet Oral Daily   simethicone  80 mg Oral TID PC    Assessment: Patient is requesting switch in anticoagulation therapy due to burning with enoxaparin injections. Patient educated that heparin prophylaxis will be three times daily and she wants to try the injections.  Goal of Therapy:  Monitor platelets by  anticoagulation protocol: Yes   Plan:  Heparin 5000 units subcutaneously q8h.  Continue to monitor H&H and platelets  [place] Nishka Heide 05/02/2023,12:50 PM

## 2023-05-03 MED ORDER — OXYCODONE HCL 5 MG PO TABS
5.0000 mg | ORAL_TABLET | Freq: Four times a day (QID) | ORAL | Status: DC | PRN
  Administered 2023-05-03 – 2023-05-04 (×7): 10 mg via ORAL
  Filled 2023-05-03 (×7): qty 2

## 2023-05-03 NOTE — Progress Notes (Signed)
Subjective: Postpartum Day 2: Cesarean Delivery Patient reports incisional pain, tolerating PO, and no problems voiding.    Objective: Vital signs in last 24 hours: Temp:  [98.4 F (36.9 C)-98.5 F (36.9 C)] 98.4 F (36.9 C) (11/30 1030) Pulse Rate:  [87-128] 98 (11/30 1030) Resp:  [17-18] 18 (11/30 1030) BP: (94-116)/(45-99) 105/57 (11/30 1030) SpO2:  [97 %-99 %] 97 % (11/30 1030)  Physical Exam:  General: alert, cooperative, and mild distress Lochia: appropriate Uterine Fundus: firm Incision: healing well, no significant drainage, no dehiscence, no significant erythema DVT Evaluation: No evidence of DVT seen on physical exam.  Recent Labs    05/01/23 0735 05/02/23 0702  HGB 11.9* 9.2*  HCT 35.4* 26.3*    Assessment/Plan: Status post Cesarean section. Doing well postoperatively.  Continue current care Requiring quite a bit of pain meds, discussed at length with patient  All questions answered regarding delivery and surgery specifics.  [name], MD 05/03/2023, 2:18 PM

## 2023-05-04 MED ORDER — GABAPENTIN 300 MG PO CAPS: 300.0000 mg | ORAL_CAPSULE | Freq: Three times a day (TID) | ORAL | 1 refills | Status: DC

## 2023-05-04 MED ORDER — OXYCODONE HCL 5 MG PO TABS: 5.0000 mg | ORAL_TABLET | Freq: Four times a day (QID) | ORAL | 0 refills | Status: DC | PRN

## 2023-05-04 MED ORDER — IBUPROFEN 600 MG PO TABS: 600.0000 mg | ORAL_TABLET | Freq: Four times a day (QID) | ORAL | 0 refills | Status: DC

## 2023-05-04 NOTE — Progress Notes (Signed)
Instructions to patient on application of abdominal binder.  Offered to assist patient with placement, but she declined @ this time.

## 2023-05-04 NOTE — Lactation Note (Signed)
This note was copied from a baby's chart.  NICU Lactation Consultation Note  Patient Name: Cassie Petersen [id number] Date: 05/04/2023 Age:37 hours  Reason for consult: Follow-up assessment; NICU baby; Infant < 6lbs; Preterm <34wks  SUBJECTIVE  LC in to visit with P3 Mom of preterm infant "Cassie Petersen" in the NICU.  Mom is experienced breastfeeding and breast pumping with 2 other babies.  Mom using her Spectra 2 pump and her pumping bra.  She has been consistently pumping and now starting to express some volume.  Breasts are soft and compressible.   Engorgement prevention and treatment reviewed.  Encouraged Mom to let [facility] set up the Medela Symphony in baby's room to avoid having to bring her pump back and forth to hospital. Mom to ask baby's RN to call LC if she would like a pump set up.  Shared with Mom the benefit of pumping in baby's room.  Mom to be discharged from [facility] today.  Mom denies any questions or concerns.  Mom aware of lactation support for her while baby is in the NICU.   OBJECTIVE Infant data: Mother's Current Feeding Choice: Breast Milk and Donor Milk  O2 Device: Jet Vent FiO2 (%): 45 %  Infant feeding assessment No data recorded  Maternal data: [id number] C-Section, High Vertical Pumping frequency: 8 times per 24 hrs Pumped volume: 5 mL Flange Size: 24  Pump: DEBP, Personal (Spectra)  Feeding Status: Trophic feedings Feeding method: Tube/Gavage (Bolus)  Maternal: Milk volume: Normal  INTERVENTIONS/PLAN Interventions: Interventions: Breast feeding basics reviewed; Assisted with latch; Skin to skin; Breast massage; Hand express; DEBP; Education Tools: Pump; Flanges (using her own Spectra DEBP) Pump Education: Setup, frequency, and cleaning; Milk Storage (FOB washes and sanitizes pump parts after every use)  Plan: Consult Status: NICU follow-up NICU Follow-up type: Verify onset of copious milk; Verify absence of engorgement   Cassie Petersen 05/04/2023,  12:59 PM

## 2023-05-04 NOTE — Discharge Summary (Signed)
Postpartum Discharge Summary     Patient Name: Cassie Petersen DOB: [birth year] MRN: [id number]  Date of admission: 04/13/2023 Delivery date:05/01/2023 Delivering provider: [name] Date of discharge: 05/04/2023  Admitting diagnosis: History of preterm premature rupture of membranes (PPROM) [Z87.59] Intrauterine pregnancy: [redacted]w[redacted]d     Secondary diagnosis:  Principal Problem:   Preterm premature rupture of membranes (PPROM)  in third trimester, antepartum Active Problems:   Bicornuate uterus   [redacted] weeks gestation of pregnancy Anhydramnios  Additional problems:     Discharge diagnosis:  S/P classical Caesarean section Fetal head entrapped in left bicornuate horn                                               Post partum procedures: Augmentation:  Complications: None  Hospital course:  Patient was admitted on 04/13/23 with P/PROM breech with bicornuate uterus [redacted]w[redacted]d Received latency antibiotics + BMZ and magnesium neuroprotection course Underwent daily monitoring, scans Managed conservatively with observation in house Began having repetitive variables on evening 05/01/23 and, unknown to patient, contractions was the reason Proceeded with urgent Caesarean section, required vertical incision, extended to a classical + nitroglycerin to atraumatically free the fetal head from the left horn of the bicornuate uterus No other intra operative complications Post op course complicated by high need for oxycodone Discharged am POD#3   Magnesium Sulfate received: Yes: Neuroprotection BMZ received: Yes Rhophylac:No MMR:No T-DaP: Flu:  RSV Vaccine received:  Transfusion:No Immunizations administered: Immunization History  Administered Date(s) Administered   Influenza, Seasonal, Injecte, Preservative Fre 03/10/2023   Influenza,inj,Quad PF,6+ Mos 02/12/2018   PFIZER(Purple Top)SARS-COV-2 Vaccination 09/25/2019, 10/16/2019   Tdap 10/29/2017, 10/04/2020    Physical exam   Vitals:   05/03/23 1030 05/03/23 1527 05/03/23 1530 05/04/23 0815  BP: (!) 105/57 101/64  112/62  Pulse: 98 87  62  Resp: 18  18 16   Temp: 98.4 F (36.9 C)  98.4 F (36.9 C) 98.7 F (37.1 C)  TempSrc: Oral  Oral Oral  SpO2: 97%  96% 100%  Weight:      Height:       General: alert, cooperative, and no distress Lochia: appropriate Uterine Fundus: firm Incision: Healing well with no significant drainage DVT Evaluation: No evidence of DVT seen on physical exam. Labs: Lab Results  Component Value Date   WBC 21.8 (H) 05/02/2023   HGB 9.2 (L) 05/02/2023   HCT 26.3 (L) 05/02/2023   MCV 91.0 05/02/2023   PLT 149 (L) 05/02/2023      Latest Ref Rng & Units 05/02/2023    7:02 AM  CMP  Glucose 70 - 99 mg/dL 253   BUN 6 - 20 mg/dL 7   Creatinine 6.64 - 4.03 mg/dL 4.74   Sodium 259 - 563 mmol/L 133   Potassium 3.5 - 5.1 mmol/L 3.9   Chloride 98 - 111 mmol/L 106   CO2 22 - 32 mmol/L 21   Calcium 8.9 - 10.3 mg/dL 8.2   Total Protein 6.5 - 8.1 g/dL 4.2   Total Bilirubin <8.7 mg/dL 0.6   Alkaline Phos 38 - 126 U/L 81   AST 15 - 41 U/L 18   ALT 0 - 44 U/L 8       Latest Ref Rng & Units 05/02/2023    7:02 AM 05/01/2023    7:35 AM 04/28/2023  7:28 AM  CBC  WBC 4.0 - 10.5 K/uL 21.8  11.1  11.9   Hemoglobin 12.0 - 15.0 g/dL 9.2  29.5  62.1   Hematocrit 36.0 - 46.0 % 26.3  35.4  36.2   Platelets 150 - 400 K/uL 149  148  170     Edinburgh Score:    05/03/2023    5:30 PM  Edinburgh Postnatal Depression Scale Screening Tool  I have been able to laugh and see the funny side of things. 0  I have looked forward with enjoyment to things. 0  I have blamed myself unnecessarily when things went wrong. 0  I have been anxious or worried for no good reason. 0  I have felt scared or panicky for no good reason. 0  Things have been getting on top of me. 0  I have been so unhappy that I have had difficulty sleeping. 0  I have felt sad or miserable. 0  I have been so unhappy that I  have been crying. 0  The thought of harming myself has occurred to me. 0  Edinburgh Postnatal Depression Scale Total 0      After visit meds:  Allergies as of 05/04/2023   No Known Allergies      Medication List     STOP taking these medications    terconazole 0.4 % vaginal cream Commonly known as: TERAZOL 7       TAKE these medications    gabapentin 300 MG capsule Commonly known as: NEURONTIN Take 1 capsule (300 mg total) by mouth 3 (three) times daily.   ibuprofen 600 MG tablet Commonly known as: ADVIL Take 1 tablet (600 mg total) by mouth every 6 (six) hours.   oxyCODONE 5 MG immediate release tablet Commonly known as: Oxy IR/ROXICODONE Take 1-2 tablets (5-10 mg total) by mouth every 6 (six) hours as needed for severe pain (pain score 7-10) or moderate pain (pain score 4-6).   prenatal multivitamin Tabs tablet Take 1 tablet by mouth at bedtime.               Discharge Care Instructions  (From admission, onward)           Start     Ordered   05/04/23 0000  Leave dressing on - Keep it clean, dry, and intact until clinic visit        05/04/23 0919             Discharge home in stable condition Infant Feeding: not sure Infant Disposition:NICU Discharge instruction: per After Visit Summary and Postpartum booklet. Activity: Advance as tolerated. Pelvic rest for 6 weeks.  Diet: routine diet Anticipated Birth Control: pt to decide Postpartum Appointment: 5 days Additional Postpartum F/U: none Future Appointments: Future Appointments  Date Time Provider Department Center  05/12/2023  8:30 AM Lesly Dukes, MD [facility]  05/26/2023  8:50 AM Milas Hock, MD [facility]   Follow up Visit:  Follow-up Information     [facility] at [facility] Follow up on 05/08/2023.   Specialty: Obstetrics and Gynecology Why: post op visit Contact information: [address] [place] [address] [phone]                    05/04/2023 [name], MD

## 2023-05-04 NOTE — Progress Notes (Signed)
OB Note Called to see patient re: pain at incision and patient concern. +flatus  +BS, mildly distended, c/d/I honeycomb dressing. Belly mildly and appropriately ttp Honeycomb removed and c/d/I incision with dermabond in place. Honeycomb replaced  Wound care instructions given  Cornelia Copa MD Attending Center for [facility] (Faculty Practice) 05/04/2023 Time: [phone]

## 2023-05-07 ENCOUNTER — Ambulatory Visit (HOSPITAL_COMMUNITY): Payer: Self-pay

## 2023-05-07 LAB — SURGICAL PATHOLOGY

## 2023-05-07 NOTE — Lactation Note (Signed)
This note was copied from a baby's chart.  NICU Lactation Consultation Note  Patient Name: Cassie Petersen [id number] Date: 05/07/2023 Age:37 days  Reason for consult: Follow-up assessment; NICU baby; Preterm <34wks; Infant < 6lbs  SUBJECTIVE  LC in to visit with P3 Mom of baby "Cassie Petersen" at [redacted]w[redacted]d PMA in the NICU.  Baby is now receiving enteral feedings of breast milk.    Mom at isolette with her hand touching baby.  Mom states she has been consistently pumping for about 60 ml each pumping times 8 per 24 hrs.  Mom denies engorgement.  Handout on how to increase milk supply when infant is in the NICU provided. Mom aware of lactation support.  LC offered to set up Medela Symphony in baby's room, but Mom declined saying she isn't present long enough, but knows she can ask at any time.  Mom prefers using her own Spectra 2 pump at home.  OBJECTIVE Infant data: No data recorded O2 Device: Jet Vent FiO2 (%): 29 %  Infant feeding assessment No data recorded  Maternal data: [id number] C-Section, High Vertical Pumping frequency: 8 times per 24 hrs Pumped volume: 60 mL Flange Size: 24  Pump: Personal, DEBP  Feeding Status: Scheduled 8-11-2-5 Feeding method: Tube/Gavage (Bolus)  Maternal: Milk volume: Normal (low normal)  INTERVENTIONS/PLAN Interventions: Interventions: Skin to skin; Breast massage; Hand express; DEBP Tools: Pump; Flanges Pump Education: Setup, frequency, and cleaning  Plan: Consult Status: NICU follow-up NICU Follow-up type: Weekly NICU follow up   Cassie Petersen 05/07/2023, 10:37 AM

## 2023-05-12 ENCOUNTER — Encounter: Payer: Self-pay | Admitting: Obstetrics & Gynecology

## 2023-05-12 ENCOUNTER — Telehealth: Payer: Self-pay | Admitting: *Deleted

## 2023-05-12 ENCOUNTER — Ambulatory Visit (INDEPENDENT_AMBULATORY_CARE_PROVIDER_SITE_OTHER): Payer: Managed Care, Other (non HMO) | Admitting: Obstetrics & Gynecology

## 2023-05-12 NOTE — Progress Notes (Signed)
   Subjective:    Patient ID: Cassie Petersen, female    DOB: 1986/01/11, 37 y.o.   MRN: [id number]  HPI  37 yo female s/p c/s on 05/01/23 for wound check.  [name] is off jet ventilator and no on bipap.  2nd degree IVH and MRI later in hospital course planned.  She is able to visit up to 3 times a day.  Incision is painful--she takes ibuprofen and occasionally the oxycodone--limits to to driving nad needing to be alert.   Review of Systems  Constitutional:  Positive for fatigue.  Cardiovascular: Negative.   Gastrointestinal: Negative.   Genitourinary: Negative.   Psychiatric/Behavioral: Negative.         Objective:   Physical Exam Vitals reviewed.  Constitutional:      General: She is not in acute distress.    Appearance: She is well-developed.  HENT:     Head: Normocephalic and atraumatic.  Eyes:     Conjunctiva/sclera: Conjunctivae normal.  Cardiovascular:     Rate and Rhythm: Normal rate.  Pulmonary:     Effort: Pulmonary effort is normal.  Abdominal:    Skin:    General: Skin is warm and dry.  Neurological:     Mental Status: She is alert and oriented to person, place, and time.  Psychiatric:        Mood and Affect: Mood normal.           Assessment & Plan:  37 yo female s/p c/s for incision check  Probably resolving hematoma--no drainage or induration; reveiwed signs of infection to contact office immediately if those occurs.  [place] next week Incision check one week.

## 2023-05-12 NOTE — Telephone Encounter (Signed)
Left patient a message to call and schedule  Return in about 1 week (around 05/19/2023).   Incision check per Dr. Penne Lash Incision check

## 2023-05-14 ENCOUNTER — Ambulatory Visit (HOSPITAL_COMMUNITY): Payer: Self-pay

## 2023-05-14 NOTE — Lactation Note (Signed)
This note was copied from a baby'Cassie chart.  NICU Lactation Consultation Note  Patient Name: Cassie Petersen [id number] Date: 05/14/2023 Age:37 days  Reason for consult: Weekly NICU follow-up; NICU baby; Infant < 6lbs; Preterm <34wks; Exclusive pumping and bottle feeding; Breast augmentation; Other (Comment) (AMA)  SUBJECTIVE Visited with family of 66 29/34 weeks old AGA NICU female; Cassie Petersen is a P3 and an experienced pumper. She reported that pumping is going well; she'Cassie pumping consistently and her volumes have increased. She was engaging in STS with baby "Cassie Petersen"; praised her for her efforts. Reviewed pumping schedule and let her know that we'll start scoring for feeding readiness at 33 weeks. She'Cassie still undecided about taking "Cassie Petersen" to breast.   OBJECTIVE Infant data: Mother'Cassie Current Feeding Choice: Breast Milk  O2 Device: CPAP FiO2 (%): 28 %  Maternal data: [id number] C-Section, High Vertical Pumping frequency: 7-8 times/24 hours Pumped volume: 120 mL (120-150 ml) Flange Size: 24 Risk factor for low/delayed milk supply:: prematurity, infant separation, AMA, < 4 lbs  Pump: Personal (Spectra S1)  ASSESSMENT Infant: Feeding Status: Scheduled 8-11-2-5 Feeding method: Tube/Gavage (Bolus)  Maternal: Milk volume: Normal  INTERVENTIONS/PLAN Interventions: Interventions: Breast feeding basics reviewed; Coconut oil; DEBP; Education Tools: Pump; Flanges; Coconut oil Pump Education: Setup, frequency, and cleaning; Milk Storage  Plan: Encouraged pumping every 3 hours, ideally 8 pumping sessions/24 hours STS with "Cassie Petersen" as much as possible She'll let NICU LC team know if she changes her mind and decides taking Cassie Petersen to breast when ready to PO; she'Cassie planning on exclusively pumping and bottle feeding at the moment   No other support person at this time. All questions and concerns answered, family to contact [facility] services PRN.  Consult Status: NICU follow-up NICU Follow-up type:  Weekly NICU follow up   Cassie Petersen Cassie Petersen 05/14/2023, 1:22 PM

## 2023-05-19 ENCOUNTER — Encounter: Payer: Self-pay | Admitting: Obstetrics & Gynecology

## 2023-05-19 ENCOUNTER — Ambulatory Visit (INDEPENDENT_AMBULATORY_CARE_PROVIDER_SITE_OTHER): Payer: Managed Care, Other (non HMO) | Admitting: Obstetrics & Gynecology

## 2023-05-19 VITALS — BP 116/81 | HR 92 | Ht 68.0 in | Wt 124.0 lb

## 2023-05-19 DIAGNOSIS — K432 Incisional hernia without obstruction or gangrene: Secondary | ICD-10-CM | POA: Diagnosis not present

## 2023-05-19 DIAGNOSIS — L7682 Other postprocedural complications of skin and subcutaneous tissue: Secondary | ICD-10-CM | POA: Diagnosis not present

## 2023-05-19 NOTE — Progress Notes (Signed)
Edinburgh Score 2

## 2023-05-19 NOTE — Progress Notes (Signed)
   Subjective:    Patient ID: Cassie Petersen, female    DOB: 03/03/1986, 37 y.o.   MRN: [id number]  HPI  37 year old G6 para 2-1-2-3 female approximately 3 weeks weeks postpartum from a primary C-section for breech.  Patient came back for relook at her wound.  She still having moderate to severe pain.  She has more bruising over the scar.  She was thinking it was the beginning of a keloid and she massaged out some of the scar tissue.  She said she did not press very hard as it is tender.  She denies any discharge from the incision.  She does have a history of forming keloids.  I can see the keloid behind her right ear after a piercing.  She also experience keloids after breast augmentation.  Patient has a negative depression screen today.  [name] is still in the NICU on CPAP.  He continues to improve slowly each day.  Review of Systems  Constitutional: Negative.   Respiratory: Negative.    Cardiovascular: Negative.   Gastrointestinal: Negative.   Genitourinary:  Positive for pelvic pain and vaginal bleeding.  Psychiatric/Behavioral:  Negative for dysphoric mood.        Objective:   Physical Exam Vitals reviewed.  Constitutional:      General: She is not in acute distress.    Appearance: She is well-developed.  HENT:     Head: Normocephalic and atraumatic.  Eyes:     Conjunctiva/sclera: Conjunctivae normal.  Cardiovascular:     Rate and Rhythm: Normal rate.  Pulmonary:     Effort: Pulmonary effort is normal.  Abdominal:    Skin:    General: Skin is warm and dry.  Neurological:     Mental Status: She is alert and oriented to person, place, and time.  Psychiatric:        Mood and Affect: Mood normal.    Vitals:   05/19/23 1451  BP: 116/81  Pulse: 92  Weight: 124 lb (56.2 kg)  Height: 5\' 8"  (1.727 m)       Assessment & Plan:   37 yo female with increased swelling and persisitent pain at c/s incision   Pelvic CT with po contrast to r/o hernia/abscess Pt has hx of  keloids with ear piercing and breast augmentation--will refer to plastics if CT negative.  No depression on Edinburgh today.

## 2023-05-26 ENCOUNTER — Encounter: Payer: Managed Care, Other (non HMO) | Admitting: Obstetrics and Gynecology

## 2023-06-02 ENCOUNTER — Ambulatory Visit (INDEPENDENT_AMBULATORY_CARE_PROVIDER_SITE_OTHER): Payer: Self-pay | Admitting: Surgical

## 2023-06-02 DIAGNOSIS — Z719 Counseling, unspecified: Secondary | ICD-10-CM

## 2023-06-02 DIAGNOSIS — Z411 Encounter for cosmetic surgery: Secondary | ICD-10-CM

## 2023-06-02 NOTE — Progress Notes (Signed)
Botulinum Toxin Procedure Note  Procedure: Cosmetic botulinum toxin  Pre-operative Diagnosis: Dynamic rhytides  Post-operative Diagnosis: Same  Complications:  None  Brief history: The patient desires botulinum toxin injection.  She is aware of the risks including bleeding, damage to deeper structures, asymmetry, brow ptosis, eyelid ptosis, bruising. The patient understands and wishes to proceed.  Procedure: The area was prepped with alcohol and dried with a clean gauze.  Using a clean technique the botulinum toxin was diluted with 2.5 mL of bacteriostatic saline per 100 unit vial which resulted in 4 units per 0.1 mL.  Subsequently the mixture was injected in the glabellar, lateral canthal lines, forehead area with preservation of the temporal branch to the lateral eyebrow. A total of 28 Units of botulinum toxin was used. The forehead and glabellar area was injected with care to inject intramuscular only while holding pressure on the supratrochlear vessels in each area during each injection on either side of the medial corrugators. The injection proceeded vertically superiorly to the medial 2/3 of the frontalis muscle and superior 2/3 of the lateral frontalis, again with preservation of the frontal branch.  Forehead injection was in a standard M pattern with 2 small injections laterally to avoid lateral brow from raising.  Also injected the forehead centrally with 2 vertical injections.  1 injection in each lateral canthal line just lateral to the lateral canthus.  Glabellar was injected in the standard pattern.  No complications were noted. Light pressure was held for 5 minutes. She was instructed explicitly in post-operative care.  Botox LOT:  [id number] EXP:  10/26

## 2023-06-05 ENCOUNTER — Ambulatory Visit (HOSPITAL_COMMUNITY): Payer: Self-pay

## 2023-06-05 NOTE — Lactation Note (Signed)
 This note was copied from a baby's chart.  NICU Lactation Consultation Note  Patient Name: Boy Shamaria Kavan Unijb'd Date: 06/05/2023 Age:38 wk.o.  Reason for consult: Weekly NICU follow-up; NICU baby; Infant < 6lbs; Exclusive pumping and bottle feeding; Breast augmentation; Other (Comment); Late-preterm 34-36.6wks (AMA)  SUBJECTIVE Visited with family of 74 16/69 weeks old AGA NICU twin female; Ms. Dizdarevic is a P3 and an experienced pumper. She continues pumping consistently but noticed that her supply has slightly decreased. Revised strategies to increase supply; she voiced she pumps whenever she can as she's very busy at home with her other two kids and commuting driving to the hospital to visit [name], her oldest one will be going back to school next week, he's in kindergarten. Left couplet engaging in STS care and praised her for all her efforts.   OBJECTIVE Infant data: Mother's Current Feeding Choice: Breast Milk  O2 Device: HHFNC O2 Flow Rate (L/min): 4 L/min FiO2 (%): 25 %  Infant feeding assessment IDFTS - Readiness: 5   Maternal data: [id number] C-Section, High Vertical Pumping frequency: 7 times/24 hours Pumped volume: 60 mL (60-120 ml) Flange Size: 24 Hands-free pumping top sizes: -- (has her own pumping bra)  Pump: Personal (Spectra  S1)  ASSESSMENT Infant: Feeding Status: Scheduled 8-11-2-5 Feeding method: Tube/Gavage (Bolus)  Maternal: Milk volume: Normal  INTERVENTIONS/PLAN Interventions: Interventions: Breast feeding basics reviewed; Coconut oil; DEBP; Education; Skin to skin Tools: Pump; Flanges; Coconut oil; Hands-free pumping top Pump Education: Setup, frequency, and cleaning; Milk Storage  Plan: Encouraged to continue pumping every 3 hours, ideally 8 pumping sessions/24 hours STS with [name] as much as possible She'll let NICU LC team know if she changes her mind and decides taking [name] to breast when ready to PO; she's planning on exclusively pumping  and bottle feeding so far   No other support person at this time. All questions and concerns answered, family to contact [facility] services PRN.  Consult Status: NICU follow-up NICU Follow-up type: Weekly NICU follow up   Jazara Swiney S Miriam 06/05/2023, 11:29 AM

## 2023-06-13 NOTE — Progress Notes (Signed)
 Post Partum Visit Note  Cassie Petersen is a 38 y.o. [id number] female who presents for a postpartum visit. She is 6 weeks postpartum following a primary cesarean section.  I have fully reviewed the prenatal and intrapartum course. The delivery was at 30 gestational weeks.  Anesthesia: spinal. Postpartum course has been difficult due to early delivery and baby in NICU. Baby is doing well. Baby is feeding by breast. Bleeding no bleeding. Bowel function is normal. Bladder function is normal. Patient is not sexually active. Contraception method is none. Postpartum depression screening: negative.  [name] is in the NICU likely for another month or so. He is doing well. They are managing but it has been difficult as anticipated esp trying to balance two children at home.   The pregnancy intention screening data noted above was reviewed. Potential methods of contraception were discussed. The patient elected to proceed with Oral Contraceptive.   Edinburgh Postnatal Depression Scale - 06/16/23 0812       Edinburgh Postnatal Depression Scale:  In the Past 7 Days   I have been able to laugh and see the funny side of things. 0    I have looked forward with enjoyment to things. 0    I have blamed myself unnecessarily when things went wrong. 0    I have been anxious or worried for no good reason. 0    I have felt scared or panicky for no good reason. 0    Things have been getting on top of me. 0    I have been so unhappy that I have had difficulty sleeping. 0    I have felt sad or miserable. 0    I have been so unhappy that I have been crying. 0    The thought of harming myself has occurred to me. 0    Edinburgh Postnatal Depression Scale Total 0             Health Maintenance Due  Topic Date Due   COVID-19 Vaccine (3 - 2024-25 season) 02/02/2023    The following portions of the patient's history were reviewed and updated as appropriate: allergies, current medications, past family history,  past medical history, past social history, past surgical history, and problem list.  Review of Systems Pertinent items are noted in HPI.  Objective:  BP 102/73   Pulse 76   Wt 125 lb (56.7 kg)   Breastfeeding Yes   BMI 19.01 kg/m    General:  alert, cooperative, and no distress   Breasts:  not indicated  Lungs: Normal respiratory effort  Heart:  Normal rate  Abdomen: Soft, non tender    Wound Well healed. Has a slight shelf at the superior aspect of her scar. Mildly tender throughout  GU exam:  not indicated       Assessment:   Postpartum exam -     norethindrone  (MICRONOR ) 0.35 MG tablet; Take 1 tablet (0.35 mg total) by mouth daily.  S/P cesarean section Discussed scar will likely change in appearance over time. Previously discussed plastic surgery referral for scar revision if needed  Plan:   Essential components of care per ACOG recommendations:  1.  Mood and well being: Patient with negative depression screening today. Reviewed local resources for support.  - Patient tobacco use? No.   - hx of drug use? No.    2. Infant care and feeding:  -Patient currently breastmilk feeding? Yes. Reviewed importance of draining breast regularly to support lactation.  -Social  determinants of health (SDOH) reviewed in EPIC. No concerns  3. Sexuality, contraception and birth spacing - Patient does not want a pregnancy in the next year.  Desired family size is 3 children.  - Reviewed reproductive life planning. Reviewed contraceptive methods based on pt preferences and effectiveness.  Patient desired Oral Contraceptive and Vasectomy today.    4. Sleep and fatigue -Encouraged family/partner/community support of 4 hrs of uninterrupted sleep to help with mood and fatigue  5. Physical Recovery  - Discussed patients delivery and complications.  - Patient had a C-section emergent. Extremely difficult delivery of fetal head due to bicornuate uterus. Will need prelabor CS if future  pregnancy occurs, but she states she does not plan any future pregnancies - Patient has urinary incontinence? No. - Patient is safe to resume physical and sexual activity  6.  Health Maintenance - HM due items addressed Yes - Last pap smear  Diagnosis  Date Value Ref Range Status  07/29/2022 (A)  Final   - Atypical squamous cells of undetermined significance (ASC-US )  Due by 07/2025 -Breast Cancer screening indicated? No. Grandmother with breast cancer. Discussed annual clinical breast exam until age 83 then annual mammogram  7. Chronic Disease/Pregnancy Condition follow up: none  Kieth JAYSON Carolin, MD [facility] [employer], [facility]

## 2023-06-16 ENCOUNTER — Ambulatory Visit: Payer: Managed Care, Other (non HMO) | Admitting: Obstetrics and Gynecology

## 2023-06-16 DIAGNOSIS — Z98891 History of uterine scar from previous surgery: Secondary | ICD-10-CM | POA: Diagnosis not present

## 2023-06-16 DIAGNOSIS — R87619 Unspecified abnormal cytological findings in specimens from cervix uteri: Secondary | ICD-10-CM | POA: Diagnosis not present

## 2023-06-16 DIAGNOSIS — Z1589 Genetic susceptibility to other disease: Secondary | ICD-10-CM

## 2023-06-16 MED ORDER — NORETHINDRONE 0.35 MG PO TABS: 1.0000 | ORAL_TABLET | Freq: Every day | ORAL | 3 refills | Status: AC

## 2023-06-17 ENCOUNTER — Ambulatory Visit (INDEPENDENT_AMBULATORY_CARE_PROVIDER_SITE_OTHER): Payer: Self-pay | Admitting: Surgical

## 2023-06-17 DIAGNOSIS — Z411 Encounter for cosmetic surgery: Secondary | ICD-10-CM

## 2023-06-17 NOTE — Progress Notes (Signed)
 Botulinum Toxin Procedure Note  Procedure: Cosmetic botulinum toxin  Pre-operative Diagnosis: Dynamic rhytides  Post-operative Diagnosis: Same  Complications:  None  Brief history: The patient desires botulinum toxin injection.  She is aware of the risks including bleeding, damage to deeper structures, asymmetry, brow ptosis, eyelid ptosis, bruising. The patient understands and wishes to proceed.  She had botox 2 weeks ago and has some slight asymmetry of the lateral brows with the right lateral brow raising more than the left. She would like some correction of this.  Procedure: The area was prepped with alcohol and dried with a clean gauze.  Using a clean technique the botulinum toxin was diluted with 2.5 mL of bacteriostatic saline per 100 unit vial which resulted in 4 units per 0.1 mL.  Subsequently the mixture was injected in the glabellar, lateral forehead area with preservation of the temporal branch to the lateral eyebrow. A total of 5 Units of botulinum toxin was used. The forehead and glabellar area was injected with care to inject intramuscular.  No complications were noted. Light pressure was held for 5 minutes. She was instructed explicitly in post-operative care.  Botox LOT:  [id number] EXP:  03/2025

## 2023-06-19 ENCOUNTER — Ambulatory Visit (HOSPITAL_COMMUNITY): Payer: Self-pay

## 2023-06-19 ENCOUNTER — Encounter: Payer: Self-pay | Admitting: Obstetrics and Gynecology

## 2023-06-19 NOTE — Lactation Note (Signed)
This note was copied from a baby's chart.  NICU Lactation Consultation Note  Patient Name: Cassie Petersen [id number] Date: 06/19/2023 Age:38 wk.o.  Reason for consult: Weekly NICU follow-up; NICU baby; Early term 37-38.6wks; Breast augmentation; Other (Comment) (AMA)  SUBJECTIVE Visited with family of 25 26/42 weeks old AGA NICU female; Ms. Maheras is a P3 and was engaging in STS care with baby "Cassie Petersen" when entered the room, praised her for her efforts. She was on a phone call during [facility] consult but when offered to come back, she said she didn't have any questions. NICU RN Angelia voiced that she would prefer to see lactation only on PRN basis, if she requests it as baby "Cassie Petersen" is not going to breast yet, her plan is to exclusively pump and bottle feed.   OBJECTIVE Infant data: Mother's Current Feeding Choice: Breast Milk  O2 Device: Nasal Cannula O2 Flow Rate (L/min): 0.1 L/min FiO2 (%): 100 %  Infant feeding assessment IDFTS - Readiness: 2   Maternal data: [id number] C-Section, High Vertical Pumping frequency: 7 times/24 hours Pumped volume: 60 mL (60-120 ml)  Pump: Personal (Spectra S1)  ASSESSMENT Infant: Feeding Status: Scheduled 8-11-2-5 Feeding method: Tube/Gavage (Bolus)  Maternal: Milk volume: Normal  INTERVENTIONS/PLAN Interventions: Interventions: Education  Plan: Consult Status: PRN   Marai Teehan S Yang Rack 06/19/2023, 11:48 AM

## 2023-06-20 ENCOUNTER — Other Ambulatory Visit (HOSPITAL_COMMUNITY): Payer: Self-pay

## 2023-06-20 ENCOUNTER — Other Ambulatory Visit: Payer: Self-pay | Admitting: Surgical

## 2023-06-20 ENCOUNTER — Encounter (HOSPITAL_COMMUNITY): Payer: Self-pay

## 2023-06-20 MED ORDER — LIDOCAINE 23% - TETRACAINE 7% TOPICAL OINTMENT (PLASTICIZED)
1.0000 | TOPICAL_OINTMENT | Freq: Once | CUTANEOUS | 0 refills | Status: AC
  Filled 2023-06-20: qty 60, 1d supply, fill #0

## 2023-07-01 ENCOUNTER — Encounter: Payer: Managed Care, Other (non HMO) | Admitting: Surgical

## 2023-07-02 ENCOUNTER — Other Ambulatory Visit (HOSPITAL_COMMUNITY): Payer: Self-pay

## 2023-07-03 ENCOUNTER — Ambulatory Visit (INDEPENDENT_AMBULATORY_CARE_PROVIDER_SITE_OTHER): Payer: Self-pay | Admitting: Surgical

## 2023-07-03 DIAGNOSIS — Z411 Encounter for cosmetic surgery: Secondary | ICD-10-CM

## 2023-07-03 NOTE — Progress Notes (Signed)
Patient is a very pleasant 38 year old female here for Moxi laser for improvement in skin tone, texture of her face and neck as well as some hyperpigmentation.  She does report that she has a history of a keloid of her right ear after a ear piercing many years ago.  It has significantly improved with use of silicone strips and pressure.  She reports a history of breast surgery and a C-section without any issues of keloiding.  We discussed the risks of keloid formation with laser procedures, patient was understanding of the risks and elected to proceed.  Given her history of surgeries without keloid, I think it is very low risk to proceed  She does normally take retinol, but stopped this 2 weeks prior.  She does not have any history of hyperpigmentation after scarring.  She would like to proceed with Moxi laser.  We discussed there is various settings, level 1 2 and 3, level 3 is 15 J and level 2 is 10 J.  We elected to treat at a level 2.5.   Preoperative Dx: hyperpigmentation  Postoperative Dx:  same  Procedure: laser to face   Anesthesia: 23% lidocaine, 7% tetracaine  Description of Procedure:  Risks and complications were explained to the patient. Consent was confirmed and signed. Time out was called and all information was confirmed to be correct. The area  area was prepped and wiped dry after allowing numbing medication to sit for 25 minutes.  The Moxi laser was set at 12.5 mJ, total coverage 10%, 6 passes. Total energy delivered 740 J

## 2023-07-04 ENCOUNTER — Telehealth: Payer: Self-pay | Admitting: Surgical

## 2023-07-04 NOTE — Telephone Encounter (Signed)
Called patient to check in on her after moxa yesterday, she reports she was doing really well.  She had very minimal discomfort, if any.  She reports she was red yesterday but the redness has subsided and is more of a pink tone today.  She is not having any issues or questions or concerns.

## 2023-07-10 ENCOUNTER — Ambulatory Visit (HOSPITAL_COMMUNITY): Payer: Self-pay

## 2023-07-10 NOTE — Lactation Note (Signed)
 This note was copied from a baby's chart.  NICU Lactation Consultation Note  Patient Name: Cassie Petersen Unijb'd Date: 07/10/2023 Age:38 m.o.  Reason for consult: Weekly NICU follow-up; NICU baby; Term; Breast augmentation; Other (Comment) (AMA)  SUBJECTIVE Visited with family of 32 73/29 weeks old AGA NICU female; baby Cassie Petersen is going home today. He's on ad lib feeding but still requiring O2 support. Ms. Rubis voiced she didn't need any assistance and she politely declined a referral for LC OP F/U. All questions and concerns answered, family to contact [facility] services PRN.  OBJECTIVE Infant data: Mother's Current Feeding Choice: Breast Milk  O2 Device: Nasal Cannula O2 Flow Rate (L/min): 0.05 L/min FiO2 (%): 100 %  Infant feeding assessment IDFTS - Readiness: 2 IDFTS - Quality: 3   Maternal data: [id number] C-Section, High Vertical No data recorded Pump: Personal (Spectra  S1)  ASSESSMENT Infant: Feeding Status: Ad lib Feeding method: Bottle Nipple Type: Nfant Slow Flow (purple)  Maternal: No data recorded  INTERVENTIONS/PLAN Interventions: Interventions: Education Discharge Education: Outpatient recommendation  Plan: Consult Status: Complete   Kaiel Weide S Kevin Space 07/10/2023, 12:12 PM

## 2023-10-08 ENCOUNTER — Encounter: Payer: Self-pay | Admitting: Surgical

## 2023-10-09 ENCOUNTER — Ambulatory Visit (INDEPENDENT_AMBULATORY_CARE_PROVIDER_SITE_OTHER): Payer: Self-pay | Admitting: Surgical

## 2023-10-09 DIAGNOSIS — Z411 Encounter for cosmetic surgery: Secondary | ICD-10-CM

## 2023-10-09 NOTE — Progress Notes (Signed)
 Botulinum Toxin Procedure Note  Procedure: Cosmetic botulinum toxin  Pre-operative Diagnosis: Dynamic rhytides  Post-operative Diagnosis: Same  Complications:  None  Brief history: The patient desires botulinum toxin injection.  She is aware of the risks including bleeding, damage to deeper structures, asymmetry, brow ptosis, eyelid ptosis, bruising. The patient understands and wishes to proceed.  Procedure: The area was prepped with alcohol and dried with a clean gauze.  Using a clean technique the botulinum toxin was diluted with 2.5 mL of bacteriostatic saline per 100 unit vial which resulted in 4 units per 0.1 mL.  Subsequently the mixture was injected in the glabellar, lateral canthal lines, forehead area with preservation of the temporal branch to the lateral eyebrow.  A total of 42 Units of botulinum toxin was used. The forehead and glabellar area was injected with care to inject intramuscular only while holding pressure on the supratrochlear vessels in each area during each injection on either side of the medial corrugators. The injection proceeded vertically superiorly to the medial 2/3 of the frontalis muscle and superior 2/3 of the lateral frontalis, again with preservation of the frontal branch.  We did also inject 1 unit in the lateral brow to assist with a lateral brow lift.  No complications were noted. Light pressure was held for 5 minutes. She was instructed explicitly in post-operative care.  Botox LOT:  [id number] EXP:  2027/03

## 2023-12-19 ENCOUNTER — Ambulatory Visit (INDEPENDENT_AMBULATORY_CARE_PROVIDER_SITE_OTHER): Payer: Self-pay | Admitting: Surgical

## 2023-12-19 DIAGNOSIS — Z719 Counseling, unspecified: Secondary | ICD-10-CM

## 2023-12-19 DIAGNOSIS — Z411 Encounter for cosmetic surgery: Secondary | ICD-10-CM

## 2023-12-19 NOTE — Progress Notes (Signed)
 Botulinum Toxin Procedure Note  Procedure: Cosmetic botulinum toxin  Pre-operative Diagnosis: Dynamic rhytides  Post-operative Diagnosis: Same  Complications:  None  Brief history: The patient desires botulinum toxin injection.  She is aware of the risks including bleeding, damage to deeper structures, asymmetry, brow ptosis, eyelid ptosis, bruising. The patient understands and wishes to proceed.  Procedure: The area was prepped with alcohol and dried with a clean gauze.  Using a clean technique the botulinum toxin was diluted with 2.5 mL of bacteriostatic saline per 100 unit vial which resulted in 4 units per 0.1 mL.  Subsequently the mixture was injected in the glabellar, lateral canthal lines, forehead area with preservation of the temporal branch to the lateral eyebrow. A total of 36 Units of botulinum toxin was used. The forehead and glabellar area was injected with care to inject intramuscular only while holding pressure on the supratrochlear vessels in each area during each injection on either side of the medial corrugators. The injection proceeded vertically superiorly to the medial 2/3 of the frontalis muscle and superior 2/3 of the lateral frontalis, again with preservation of the frontal branch.  1 unit was injected in each lateral brow   No complications were noted. Light pressure was held for 5 minutes. She was instructed explicitly in post-operative care.  Botox LOT:  [id number] EXP:  10/2025

## 2023-12-22 ENCOUNTER — Encounter: Payer: Self-pay | Admitting: Surgical

## 2024-05-15 ENCOUNTER — Encounter: Payer: Self-pay | Admitting: Obstetrics & Gynecology

## 2024-06-22 ENCOUNTER — Other Ambulatory Visit: Payer: Self-pay | Admitting: Obstetrics and Gynecology

## 2024-07-07 ENCOUNTER — Telehealth: Payer: Self-pay | Admitting: *Deleted

## 2024-07-07 ENCOUNTER — Ambulatory Visit: Admitting: Physician Assistant

## 2024-07-07 ENCOUNTER — Other Ambulatory Visit (HOSPITAL_COMMUNITY): Payer: Self-pay

## 2024-07-07 ENCOUNTER — Other Ambulatory Visit: Payer: Self-pay | Admitting: Physician Assistant

## 2024-07-07 VITALS — BP 110/78 | HR 93 | Temp 98.0°F | Ht 68.0 in | Wt 129.0 lb

## 2024-07-07 DIAGNOSIS — G43109 Migraine with aura, not intractable, without status migrainosus: Secondary | ICD-10-CM

## 2024-07-07 DIAGNOSIS — N644 Mastodynia: Secondary | ICD-10-CM

## 2024-07-07 DIAGNOSIS — Z803 Family history of malignant neoplasm of breast: Secondary | ICD-10-CM

## 2024-07-07 MED ORDER — RIZATRIPTAN BENZOATE 10 MG PO TABS: 10.0000 mg | ORAL_TABLET | ORAL | 0 refills | Status: AC | PRN

## 2024-07-07 MED ORDER — RIZATRIPTAN BENZOATE 5 MG PO TABS: 5.0000 mg | ORAL_TABLET | ORAL | 5 refills | Status: DC | PRN

## 2024-07-07 NOTE — Telephone Encounter (Signed)
 Copied from CRM #[id number]. Topic: Clinical - Prescription Issue >> Jul 07, 2024 10:38 AM Viola F wrote: Reason for CRM: Patient was seen today and would like Samantha to resend the rizatriptan  (MAXALT ) 10 MG tablet instead of the 5mg  to the [id number] - [place], [place] - [address] AT [facility] [address]

## 2024-07-07 NOTE — Progress Notes (Signed)
 "  History of Present Illness:   Chief Complaint  Patient presents with   Migraine    Pt still having migraines and would like to discuss.    Discussed the use of AI scribe software for clinical note transcription with the patient, who gave verbal consent to proceed.  History of Present Illness   Cassie Petersen is a 39 year old female with migraines who presents for follow-up regarding her migraine management.  She typically has migraines preceded by a right eye visual aura, followed by a dull ache and right-sided facial and eye pressure. Pain resolves within an hour when she treats early. She uses a 10 mg maxalt  but cuts tablets to 5 mg due to excessive tiredness at the full dose, and takes it a couple of times per week, which prevents severe pain. Her main triggers are snow and bright light, with a recent episode after prolonged snow exposure. She feels her migraines overall are improved. She denies lingering visual changes, numbness, tingling, facial tingling, or recent nausea when she treats at aura onset.     She had a diagnostic mammogram in Oct 2021 for R breast pain. This persists and she would like a repeat exam.   Past Medical History:  Diagnosis Date   Bicornuate uterus    HPV in female    PONV (postoperative nausea and vomiting)    Supervision of other normal pregnancy, antepartum 12/12/2022              NURSING     PROVIDER      Office Location    [place]    Dating by    U/S at 10 wks      [facility] Model    Traditional    Anatomy U/S           Initiated care at     [employer]     English                     LAB RESULTS       Support Person    [place]    Genetics    NIPS:  LR  AFP:                 NT/IT (FT only)                     Carrier Screen    Horizo   Vaginal Pap smear, abnormal    ASCUS     Social History[1]  Past Surgical History:  Procedure Laterality Date   APPENDECTOMY     BREAST ENHANCEMENT SURGERY     BREAST SURGERY  Bilateral    CESAREAN SECTION N/A 05/01/2023   Procedure: CESAREAN SECTION;  Surgeon: Jayne Vonn DEL, MD;  Location: [facility] LD ORS;  Service: Obstetrics;  Laterality: N/A;   DILATION AND EVACUATION N/A 11/17/2019   Procedure: DILATATION AND EVACUATION;  Surgeon: Herchel Gloris LABOR, MD;  Location: [facility] OR;  Service: Gynecology;  Laterality: N/A;   MULTIPLE TOOTH EXTRACTIONS      Family History  Problem Relation Age of Onset   Vision loss Mother    Breast cancer Paternal Grandmother 25   Healthy Father    Healthy Son     Allergies[2]  Current Medications:  Current Medications[3]   Review of Systems:  Negative unless otherwise specified per HPI.  Vitals:   Vitals:   07/07/24 0942  BP: 110/78  Pulse: 93  Temp: 98 F (36.7 C)  TempSrc: Temporal  SpO2: 98%  Weight: 129 lb (58.5 kg)  Height: 5' 8 (1.727 m)     Body mass index is 19.61 kg/m.  Physical Exam:   Physical Exam Vitals and nursing note reviewed.  Constitutional:      General: She is not in acute distress.    Appearance: She is well-developed. She is not ill-appearing or toxic-appearing.  Cardiovascular:     Rate and Rhythm: Normal rate and regular rhythm.     Pulses: Normal pulses.     Heart sounds: Normal heart sounds, S1 normal and S2 normal.  Pulmonary:     Effort: Pulmonary effort is normal.     Breath sounds: Normal breath sounds.  Skin:    General: Skin is warm and dry.  Neurological:     General: No focal deficit present.     Mental Status: She is alert.     GCS: GCS eye subscore is 4. GCS verbal subscore is 5. GCS motor subscore is 6.     Cranial Nerves: Cranial nerves 2-12 are intact.     Sensory: Sensation is intact.     Motor: Motor function is intact.     Coordination: Coordination is intact.     Gait: Gait is intact.  Psychiatric:        Speech: Speech normal.        Behavior: Behavior normal. Behavior is cooperative.     Assessment and Plan:   Assessment and Plan    Migraine with  aura and without status migrainosus, not intractable  Chronic migraines with aura, effectively managed with Maxalt  5 mg as needed. Botox has reduced frequency. No new neurological symptoms. Previous MRI normal.  - Prescribed Maxalt  5 mg for as needed use; declined trialing newer medications. - Continue current management unless new symptoms develop. - Consider neurology referral if new symptoms like tingling or persistent visual disturbances occur. - Discussed Botox for migraine management if interested.  Right breast pain Family history of breast cancer - Ordered mammogram due to dense breast tissue, breast pain and family history.  Health maintenance - Discussed colon cancer screening starting at age 36 unless family history indicates earlier screening. - Advised to obtain family history details regarding mother's past colonoscopy findings for potential early screening.      Lucie Buttner, PA-C    [1]  Social History Tobacco Use   Smoking status: Never   Smokeless tobacco: Never  Vaping Use   Vaping status: Never Used  Substance Use Topics   Alcohol use: Not Currently    Comment: social   Drug use: Never  [2] No Known Allergies [3]  Current Outpatient Medications:    norethindrone  (MICRONOR ) 0.35 MG tablet, Take 1 tablet (0.35 mg total) by mouth daily., Disp: 90 tablet, Rfl: 3   rizatriptan  (MAXALT ) 10 MG tablet, Take 10 mg by mouth as needed for migraine., Disp: , Rfl:   "

## 2024-07-07 NOTE — Addendum Note (Signed)
 Addended by: THURMON ARLAND PARAS on: 07/07/2024 11:47 AM   Modules accepted: Orders

## 2024-07-07 NOTE — Telephone Encounter (Signed)
 Please see message. Pt would like Maxalt  10 mg sent instead of 5 mg, okay to change Rx?

## 2024-07-16 ENCOUNTER — Other Ambulatory Visit

## 2024-07-16 ENCOUNTER — Encounter

## 2024-08-04 ENCOUNTER — Ambulatory Visit: Admitting: Obstetrics and Gynecology

## 2024-08-16 ENCOUNTER — Ambulatory Visit: Admitting: Obstetrics & Gynecology

## 2024-09-21 ENCOUNTER — Other Ambulatory Visit: Admitting: Plastic Surgery
